# Patient Record
Sex: Male | Born: 1960 | Race: White | Hispanic: No | Marital: Single | Smoking: Former smoker
Health system: Southern US, Community
[De-identification: ages and names within clinical notes are randomized; demographics above are authoritative.]

## PROBLEM LIST (undated history)

## (undated) DIAGNOSIS — E119 Type 2 diabetes mellitus without complications: Secondary | ICD-10-CM

## (undated) DIAGNOSIS — F319 Bipolar disorder, unspecified: Secondary | ICD-10-CM

## (undated) DIAGNOSIS — E7800 Pure hypercholesterolemia, unspecified: Secondary | ICD-10-CM

## (undated) DIAGNOSIS — I10 Essential (primary) hypertension: Secondary | ICD-10-CM

## (undated) DIAGNOSIS — F1290 Cannabis use, unspecified, uncomplicated: Secondary | ICD-10-CM

## (undated) DIAGNOSIS — S3994XA Unspecified injury of external genitals, initial encounter: Secondary | ICD-10-CM

## (undated) DIAGNOSIS — Z720 Tobacco use: Secondary | ICD-10-CM

## (undated) HISTORY — PX: APPENDECTOMY: SHX54

---

## 2006-12-22 ENCOUNTER — Emergency Department (HOSPITAL_COMMUNITY): Admission: EM | Admit: 2006-12-22 | Discharge: 2006-12-22 | Payer: Self-pay | Admitting: Emergency Medicine

## 2007-10-15 ENCOUNTER — Ambulatory Visit: Payer: Self-pay | Admitting: Cardiology

## 2008-12-28 ENCOUNTER — Inpatient Hospital Stay (HOSPITAL_COMMUNITY): Admission: EM | Admit: 2008-12-28 | Discharge: 2008-12-30 | Payer: Self-pay | Admitting: Emergency Medicine

## 2010-10-30 LAB — CBC
HCT: 36 % — ABNORMAL LOW (ref 39.0–52.0)
HCT: 41.8 % (ref 39.0–52.0)
Hemoglobin: 12.7 g/dL — ABNORMAL LOW (ref 13.0–17.0)
Hemoglobin: 12.9 g/dL — ABNORMAL LOW (ref 13.0–17.0)
Hemoglobin: 14.8 g/dL (ref 13.0–17.0)
MCHC: 35.6 g/dL (ref 30.0–36.0)
MCV: 86.9 fL (ref 78.0–100.0)
MCV: 88.7 fL (ref 78.0–100.0)
Platelets: 235 10*3/uL (ref 150–400)
RBC: 4.14 MIL/uL — ABNORMAL LOW (ref 4.22–5.81)
RDW: 12.3 % (ref 11.5–15.5)
WBC: 11.5 10*3/uL — ABNORMAL HIGH (ref 4.0–10.5)
WBC: 7.3 10*3/uL (ref 4.0–10.5)

## 2010-10-30 LAB — DIFFERENTIAL
Basophils Absolute: 0 10*3/uL (ref 0.0–0.1)
Basophils Absolute: 0.1 10*3/uL (ref 0.0–0.1)
Basophils Relative: 0 % (ref 0–1)
Basophils Relative: 1 % (ref 0–1)
Eosinophils Absolute: 0.1 10*3/uL (ref 0.0–0.7)
Eosinophils Relative: 1 % (ref 0–5)
Lymphocytes Relative: 27 % (ref 12–46)
Lymphocytes Relative: 30 % (ref 12–46)
Lymphs Abs: 2.9 10*3/uL (ref 0.7–4.0)
Monocytes Absolute: 0.6 10*3/uL (ref 0.1–1.0)
Monocytes Absolute: 0.7 10*3/uL (ref 0.1–1.0)
Monocytes Relative: 6 % (ref 3–12)
Neutro Abs: 4.2 10*3/uL (ref 1.7–7.7)
Neutro Abs: 4.2 10*3/uL (ref 1.7–7.7)
Neutrophils Relative %: 62 % (ref 43–77)

## 2010-10-30 LAB — COMPREHENSIVE METABOLIC PANEL
Alkaline Phosphatase: 53 U/L (ref 39–117)
BUN: 9 mg/dL (ref 6–23)
CO2: 31 mEq/L (ref 19–32)
Chloride: 100 mEq/L (ref 96–112)
Creatinine, Ser: 0.64 mg/dL (ref 0.4–1.5)
GFR calc non Af Amer: >60 mL/min (ref >60)
Glucose, Bld: 110 mg/dL — ABNORMAL HIGH (ref 70–99)
Potassium: 4 mEq/L (ref 3.5–5.1)
Total Bilirubin: 0.6 mg/dL (ref 0.3–1.2)

## 2010-10-30 LAB — VANCOMYCIN, TROUGH: Vancomycin Tr: 9.1 ug/mL — ABNORMAL LOW (ref 10.0–20.0)

## 2010-10-30 LAB — FIBRINOGEN: Fibrinogen: 350 mg/dL (ref 204–475)

## 2010-10-30 LAB — BASIC METABOLIC PANEL
BUN: 8 mg/dL (ref 6–23)
CO2: 29 mEq/L (ref 19–32)
Calcium: 8.8 mg/dL (ref 8.4–10.5)
Chloride: 98 mEq/L (ref 96–112)
Creatinine, Ser: 0.67 mg/dL (ref 0.4–1.5)
GFR calc Af Amer: >60 mL/min (ref >60)
GFR calc non Af Amer: >60 mL/min (ref >60)
Glucose, Bld: 131 mg/dL — ABNORMAL HIGH (ref 70–99)
Potassium: 3.7 mEq/L (ref 3.5–5.1)
Sodium: 137 mEq/L (ref 135–145)

## 2010-10-30 LAB — PROTIME-INR: Prothrombin Time: 13.3 seconds (ref 11.6–15.2)

## 2010-12-05 NOTE — H&P (Signed)
[name], Omar Wood                 ACCOUNT NO.:  [id number]   MEDICAL RECORD NO.:  [id number]          PATIENT TYPE:  INP   LOCATION:  A214                          FACILITY:  [facility]   PHYSICIAN:  Margaretmary Dys, M.D.DATE OF BIRTH:  02/11/1961   DATE OF ADMISSION:  12/28/2008  DATE OF DISCHARGE:  LH                              HISTORY & PHYSICAL   ADMISSION DIAGNOSES:  1. Probable snake bite right arm.  2. Swelling with redness and induration and tenderness entire right      arm and forearm and hand.  3. History of anxiety disorder.   CHIEF COMPLAINT:  Probable state by last night.   HISTORY OF PRESENT ILLNESS:  Mr. Agne is a 50 year old male who  presented to our emergency room today complaining of severe swelling and  pain in his right arm.  The patient said he was working and he may have  put his hand in a hole and felt something bite him.  This was around 9  p.m.  The patient did not see what bit him.  The patient subsequently  went to his nearest hospital where he received some pain medication and  was sent home.   I think the patient went to [facility].  He has had increasing pain and  swelling in that right arm.  He denies any numbness.  He does report  that the pain is aggravated by moving his arm.   He has had some redness too.  Initially when the bite occurred, the  swelling stopped at his elbow, but this has extended up to his upper  arm.   The patient is currently reporting his pain to be about 5-6/10 with  Dilaudid helping.  The patient denies any fevers or chills.  No  headaches or dizziness.  No other skin eruptions or erythema was  observed other than his right arm.  He denies any other behavioral  changes.  He denies any hematuria or dysuria.  The patient did have some  nausea and vomiting earlier today.  He said it was probably from the  pain medication from the Dilaudid.  The patient arrived here in the  emergency room and is now being admitted for  further management.   REVIEW OF SYSTEMS:  As mentioned history of present illness.   PAST MEDICAL HISTORY:  1. Hypertension.  2. History of anxiety disorder.Marland Kitchen   PAST SURGICAL HISTORY:  Status post appendectomy.   SOCIAL HISTORY:  The patient is single, has no children and he is smoker  of one pack of cigarettes a day.  He drinks fairly heavily and also has  history of narcotic abuse.   FAMILY HISTORY:  The patient denies any history of diabetes or  hypertension.  The patient is currently unemployed.   MEDICATIONS:  1. Restoril at night.  2. Xanax tablets up to four times a day.  The patient does not give      any doses at this time.   ALLERGIES:  NO KNOWN DRUG ALLERGIES.   PHYSICAL EXAM:  GENERAL:  The patient was conscious, alert, comfortable,  not in acute distress, was well oriented in time, place and person.  The  patient appears somewhat anxious.  White blood cell count was 11.5,  hemoglobin of 14.8, hematocrit 41.8, platelet count was 235 with no left  shift.  PT was 12.8, INR 1.0, PTT of 28.  Fibrinogen was 350.  Sodium is  131, potassium is 3.7, chloride of 98, CO2 is 23, glucose 139, BUN of 8,  creatinine was 0.59, calcium is 8.6.   ASSESSMENT:  A 50 year old male presenting to the emergency room with  swelling of his right arm and hand and upper arm.   The patient appears to have puncture wound in the middle finger of his  right hand.  The patient has received antivenom in the emergency room..  The patient's pain is controlled currently with Dilaudid.   PLAN:  1. The patient will be admitted to the medical floor for close      observation.  2. The patient coagulation profile is normal at this time.  3. Will keep his right arm elevated to relieve the pain and discomfort      associated with swelling.  4. Will continue to monitor his neurovascular bundle.  The patient is      able to move all his arms and has good capillary refill.  5. The patient has received  an antivenin in the emergency room.  6. The patient also received DPT toxoid immunization in the emergency      room.  The patient is currently empirically on vancomycin and Zosyn      which we will request pharmacy to dose.  7. The patient be placed on SCDs for DVT prophylaxis.  8. The patient received some Ativan p.r.n.  We will monitor      coagulation profile again in the morning and liver function tests      which remains stable.  9. The patient will be maintained on IV fluids normal saline with some      potassium replacement.   I have explained the above plan to the patient and also mentioned  potential concern for worsening swelling that might require emergency  surgery such as a fasciotomy.  The patient verbalized full understanding  and requested Dr. Leticia Penna to evaluate the patient as a non-urgent  consult.      Margaretmary Dys, M.D.  Electronically Signed     AM/MEDQ  D:  12/28/2008  T:  12/28/2008  Job:  [id number]

## 2010-12-05 NOTE — Discharge Summary (Signed)
NAME:  Omar Wood, Omar Wood                 ACCOUNT NO.:  [id number]   MEDICAL RECORD NO.:  [id number]          PATIENT TYPE:  INP   LOCATION:  A328                          FACILITY:  [facility]   PHYSICIAN:  Osvaldo Shipper, MD     DATE OF BIRTH:  [birth year]   DATE OF ADMISSION:  12/28/2008  DATE OF DISCHARGE:  06/10/2010LH                               DISCHARGE SUMMARY   The patient does not have a PMD.   DISCHARGE DIAGNOSES:  1. Snake bite, unknown type, improved.  2. Anxiety disorder.   Please review H and P dictated at the time of admission for details  regarding the patient's presenting illness.   BRIEF HOSPITAL COURSE:  Briefly, this is a 50 year old Caucasian male  who was in his usual state of health until the date before admission  when he was outside his home, moving some logs when he suddenly felt a  bite on his right hand.  He saw bite marks.  He went to [facility] where he received some pain medication and was sent home.  The  patient subsequently started developing swelling in that right arm and  so, he decided to come in to [facility].  He was thought to  have a snake bite since he had 2 puncture wounds.  He was given  antivenom.  The patient was subsequently admitted for further evaluation  and monitoring.  His right arm did show significant swelling, but pulses  were present.  He did not experience any tingling or numbness.  Neurovascular bundle seemed to be patent and viable.  He was also seen  by general surgeon, Dr. Leticia Penna, who did not feel like there was any  indication for surgical intervention.  The patient was put on vancomycin  and Zosyn initially.  His swelling has significantly improved.  His pain  has been somewhat better.  His blood work is all unremarkable.  So at  this time, the patient has shown significant improvement and can be  discharged home just with p.o. doxycycline.  He denies any complaints  this morning.  He is anxious to get out of  here.  His vital signs are  all stable.  He is afebrile.  Lungs are clear.  His right upper  extremity showing significantly improved swelling.  Pulses are present.  Bite mark obviously still present.  No significant erythema is noted.   DISCHARGE MEDICATIONS:  1. Doxycycline 100 mg twice daily for 7 days.  2. Percocet 5/325 every 6 hours as needed for pain, 20 tablets      prescribed.  3. Ativan 1 mg every 6 hours as needed for anxiety, 10 tablets      prescribed.   Follow up with Mental Health for anxiety.  He may call Dr. Leticia Penna for  followup if the swelling does not seem to be improving.   DIET:  Regular.   PHYSICAL ACTIVITY:  No restrictions.  He may return to work next Monday,  that is January 03, 2009, but currently he is unemployed.   He is requesting financial assistance with medications.  [employer] does  not have doxycycline on its 4-dollar list; however, he needs this  antibiotic.   No imaging studies were done during this admission.  Consultations from  Dr. Leticia Penna as discussed above.   Total time on this encounter 35 minutes.      Osvaldo Shipper, MD  Electronically Signed     GK/MEDQ  D:  12/30/2008  T:  12/31/2008  Job:  [id number]   cc:   Tilford Pillar, MD  Fax: [phone]
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
## 2011-05-10 LAB — COMPREHENSIVE METABOLIC PANEL
ALT: 28
BUN: 7
CO2: 31
Calcium: 9.3
Creatinine, Ser: 0.77
GFR calc non Af Amer: >60
Glucose, Bld: 160 — ABNORMAL HIGH
Sodium: 138
Total Protein: 7.1

## 2011-05-10 LAB — RAPID URINE DRUG SCREEN, HOSP PERFORMED
Barbiturates: NOT DETECTED
Cocaine: NOT DETECTED
Opiates: NOT DETECTED

## 2011-05-10 LAB — CBC
HCT: 47.5
Hemoglobin: 16.2
MCHC: 34.1
MCV: 93.1
RBC: 5.1
RDW: 14.8 — ABNORMAL HIGH

## 2011-05-10 LAB — DIFFERENTIAL
Basophils Absolute: 0
Basophils Relative: 0
Eosinophils Relative: 1
Lymphs Abs: 1.2
Monocytes Absolute: 0.6
Neutro Abs: 6.7
Neutrophils Relative %: 77

## 2014-02-15 ENCOUNTER — Emergency Department (HOSPITAL_COMMUNITY)
Admission: EM | Admit: 2014-02-15 | Discharge: 2014-02-15 | Disposition: A | Discharge status: Home / Self Care | Payer: Self-pay | Attending: Emergency Medicine | Admitting: Emergency Medicine

## 2014-02-15 ENCOUNTER — Encounter (HOSPITAL_COMMUNITY): Payer: Self-pay | Admitting: Emergency Medicine

## 2014-02-15 ENCOUNTER — Emergency Department (HOSPITAL_COMMUNITY): Payer: Self-pay

## 2014-02-15 DIAGNOSIS — F17200 Nicotine dependence, unspecified, uncomplicated: Secondary | ICD-10-CM | POA: Insufficient documentation

## 2014-02-15 DIAGNOSIS — Z87828 Personal history of other (healed) physical injury and trauma: Secondary | ICD-10-CM | POA: Insufficient documentation

## 2014-02-15 DIAGNOSIS — R079 Chest pain, unspecified: Secondary | ICD-10-CM | POA: Insufficient documentation

## 2014-02-15 DIAGNOSIS — E119 Type 2 diabetes mellitus without complications: Secondary | ICD-10-CM | POA: Insufficient documentation

## 2014-02-15 DIAGNOSIS — I10 Essential (primary) hypertension: Secondary | ICD-10-CM | POA: Insufficient documentation

## 2014-02-15 DIAGNOSIS — Z79899 Other long term (current) drug therapy: Secondary | ICD-10-CM | POA: Insufficient documentation

## 2014-02-15 DIAGNOSIS — R109 Unspecified abdominal pain: Secondary | ICD-10-CM | POA: Insufficient documentation

## 2014-02-15 DIAGNOSIS — R1030 Lower abdominal pain, unspecified: Secondary | ICD-10-CM

## 2014-02-15 HISTORY — DX: Unspecified injury of external genitals, initial encounter: S39.94XA

## 2014-02-15 HISTORY — DX: Essential (primary) hypertension: I10

## 2014-02-15 HISTORY — DX: Type 2 diabetes mellitus without complications: E11.9

## 2014-02-15 HISTORY — DX: Pure hypercholesterolemia, unspecified: E78.00

## 2014-02-15 LAB — BASIC METABOLIC PANEL
ANION GAP: 18 — AB (ref 5–15)
BUN: 5 mg/dL — ABNORMAL LOW (ref 6–23)
CHLORIDE: 97 meq/L (ref 96–112)
CO2: 25 meq/L (ref 19–32)
Calcium: 8.9 mg/dL (ref 8.4–10.5)
Creatinine, Ser: 0.46 mg/dL — ABNORMAL LOW (ref 0.50–1.35)
GFR calc Af Amer: >90 mL/min (ref >90)
GFR calc non Af Amer: >90 mL/min (ref >90)
GLUCOSE: 292 mg/dL — AB (ref 70–99)
Potassium: 4.2 mEq/L (ref 3.7–5.3)
SODIUM: 140 meq/L (ref 137–147)

## 2014-02-15 LAB — CBC WITH DIFFERENTIAL/PLATELET
BASOS ABS: 0 10*3/uL (ref 0.0–0.1)
Basophils Relative: 0 % (ref 0–1)
Eosinophils Absolute: 0.1 10*3/uL (ref 0.0–0.7)
Eosinophils Relative: 1 % (ref 0–5)
HEMATOCRIT: 37.7 % — AB (ref 39.0–52.0)
Hemoglobin: 13.2 g/dL (ref 13.0–17.0)
LYMPHS ABS: 1.8 10*3/uL (ref 0.7–4.0)
LYMPHS PCT: 18 % (ref 12–46)
MCH: 30.8 pg (ref 26.0–34.0)
MCHC: 35 g/dL (ref 30.0–36.0)
MCV: 87.9 fL (ref 78.0–100.0)
Monocytes Absolute: 0.7 10*3/uL (ref 0.1–1.0)
Monocytes Relative: 7 % (ref 3–12)
NEUTROS ABS: 7.7 10*3/uL (ref 1.7–7.7)
Neutrophils Relative %: 74 % (ref 43–77)
PLATELETS: 263 10*3/uL (ref 150–400)
RBC: 4.29 MIL/uL (ref 4.22–5.81)
RDW: 13.5 % (ref 11.5–15.5)
WBC: 10.2 10*3/uL (ref 4.0–10.5)

## 2014-02-15 LAB — TROPONIN I

## 2014-02-15 MED ORDER — LORAZEPAM 2 MG/ML IJ SOLN
1.0000 mg | Freq: Once | INTRAMUSCULAR | Status: AC
Start: 2014-02-15 — End: 2014-02-15
  Administered 2014-02-15: 1 mg via INTRAVENOUS
  Filled 2014-02-15: qty 1

## 2014-02-15 MED ORDER — MORPHINE SULFATE 4 MG/ML IJ SOLN
6.0000 mg | Freq: Once | INTRAMUSCULAR | Status: AC
  Administered 2014-02-15: 6 mg via INTRAVENOUS
  Filled 2014-02-15: qty 2

## 2014-02-15 MED ORDER — SODIUM CHLORIDE 0.9 % IV BOLUS (SEPSIS)
1000.0000 mL | Freq: Once | INTRAVENOUS | Status: AC
  Administered 2014-02-15: 1000 mL via INTRAVENOUS

## 2014-02-15 MED ORDER — HYDROMORPHONE HCL PF 1 MG/ML IJ SOLN: 1.0000 mg | Freq: Once | INTRAMUSCULAR | Status: DC

## 2014-02-15 MED ORDER — HYDROMORPHONE HCL PF 1 MG/ML IJ SOLN
1.0000 mg | Freq: Once | INTRAMUSCULAR | Status: AC
  Administered 2014-02-15: 1 mg via INTRAVENOUS
  Filled 2014-02-15: qty 1

## 2014-02-15 MED ORDER — LORAZEPAM 1 MG PO TABS: 1.0000 mg | ORAL_TABLET | Freq: Three times a day (TID) | ORAL | Status: DC | PRN

## 2014-02-15 NOTE — ED Notes (Signed)
Pt upset that he did not receive prescription for pain and for spasms, pt received script for Ativan and explained to pt that Ativan can help with spasms, pt still upset, refused to sign for d/c instructions, pt ambulated out with mother

## 2014-02-15 NOTE — ED Notes (Signed)
MD at bedside. 

## 2014-02-15 NOTE — ED Notes (Addendum)
Pt states CP x 2 days, described as constant and pressure-like. Also states pain to scrotum and lower abdomen. States [facility] with [facility] and bladder spasms. Pt states pain medication is not working for bladder spasms. Pt is not very cooperative in triage. Pt states he has had a couple of beers today

## 2014-02-15 NOTE — Discharge Instructions (Signed)
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
## 2017-08-13 ENCOUNTER — Encounter (HOSPITAL_COMMUNITY): Payer: Self-pay | Admitting: Emergency Medicine

## 2017-08-13 ENCOUNTER — Emergency Department (HOSPITAL_COMMUNITY)
Admission: EM | Admit: 2017-08-13 | Discharge: 2017-08-13 | Disposition: A | Discharge status: Home / Self Care | Attending: Emergency Medicine | Admitting: Emergency Medicine

## 2017-08-13 ENCOUNTER — Other Ambulatory Visit: Payer: Self-pay

## 2017-08-13 ENCOUNTER — Emergency Department (HOSPITAL_COMMUNITY)

## 2017-08-13 DIAGNOSIS — Z87891 Personal history of nicotine dependence: Secondary | ICD-10-CM | POA: Insufficient documentation

## 2017-08-13 DIAGNOSIS — I10 Essential (primary) hypertension: Secondary | ICD-10-CM | POA: Insufficient documentation

## 2017-08-13 DIAGNOSIS — E1010 Type 1 diabetes mellitus with ketoacidosis without coma: Secondary | ICD-10-CM | POA: Insufficient documentation

## 2017-08-13 DIAGNOSIS — Z794 Long term (current) use of insulin: Secondary | ICD-10-CM | POA: Insufficient documentation

## 2017-08-13 DIAGNOSIS — E860 Dehydration: Secondary | ICD-10-CM | POA: Insufficient documentation

## 2017-08-13 DIAGNOSIS — R4182 Altered mental status, unspecified: Secondary | ICD-10-CM | POA: Diagnosis present

## 2017-08-13 DIAGNOSIS — R739 Hyperglycemia, unspecified: Secondary | ICD-10-CM

## 2017-08-13 LAB — CBC WITH DIFFERENTIAL/PLATELET
BASOS ABS: 0 10*3/uL (ref 0.0–0.1)
Basophils Relative: 0 %
Eosinophils Absolute: 0 10*3/uL (ref 0.0–0.7)
Eosinophils Relative: 0 %
HEMATOCRIT: 49.2 % (ref 39.0–52.0)
HEMOGLOBIN: 16.9 g/dL (ref 13.0–17.0)
Lymphocytes Relative: 24 %
Lymphs Abs: 2.9 10*3/uL (ref 0.7–4.0)
MCH: 28.4 pg (ref 26.0–34.0)
MCHC: 34.3 g/dL (ref 30.0–36.0)
MCV: 82.7 fL (ref 78.0–100.0)
MONO ABS: 0.7 10*3/uL (ref 0.1–1.0)
Monocytes Relative: 6 %
NEUTROS ABS: 8.3 10*3/uL — AB (ref 1.7–7.7)
NEUTROS PCT: 70 %
Platelets: 248 10*3/uL (ref 150–400)
RBC: 5.95 MIL/uL — AB (ref 4.22–5.81)
RDW: 12 % (ref 11.5–15.5)
WBC: 12 10*3/uL — AB (ref 4.0–10.5)

## 2017-08-13 LAB — BASIC METABOLIC PANEL
Anion gap: 12 (ref 5–15)
BUN: 18 mg/dL (ref 6–20)
CO2: 25 mmol/L (ref 22–32)
CREATININE: 0.73 mg/dL (ref 0.61–1.24)
Calcium: 8.3 mg/dL — ABNORMAL LOW (ref 8.9–10.3)
Chloride: 98 mmol/L — ABNORMAL LOW (ref 101–111)
GFR calc Af Amer: >60 mL/min (ref >60)
Glucose, Bld: 205 mg/dL — ABNORMAL HIGH (ref 65–99)
POTASSIUM: 3.4 mmol/L — AB (ref 3.5–5.1)
SODIUM: 135 mmol/L (ref 135–145)

## 2017-08-13 LAB — URINALYSIS, ROUTINE W REFLEX MICROSCOPIC
BACTERIA UA: NONE SEEN
BILIRUBIN URINE: NEGATIVE
Glucose, UA: >=500 mg/dL — AB
Hgb urine dipstick: NEGATIVE
Ketones, ur: 20 mg/dL — AB
Leukocytes, UA: NEGATIVE
Nitrite: NEGATIVE
Protein, ur: NEGATIVE mg/dL
RBC / HPF: NONE SEEN RBC/hpf (ref 0–5)
SQUAMOUS EPITHELIAL / LPF: NONE SEEN
Specific Gravity, Urine: 1.033 — ABNORMAL HIGH (ref 1.005–1.030)
pH: 5 (ref 5.0–8.0)

## 2017-08-13 LAB — COMPREHENSIVE METABOLIC PANEL
ALBUMIN: 4.4 g/dL (ref 3.5–5.0)
ALT: 100 U/L — ABNORMAL HIGH (ref 17–63)
ANION GAP: 17 — AB (ref 5–15)
AST: 43 U/L — ABNORMAL HIGH (ref 15–41)
Alkaline Phosphatase: 119 U/L (ref 38–126)
BILIRUBIN TOTAL: 0.8 mg/dL (ref 0.3–1.2)
BUN: 23 mg/dL — ABNORMAL HIGH (ref 6–20)
CO2: 24 mmol/L (ref 22–32)
Calcium: 9.5 mg/dL (ref 8.9–10.3)
Chloride: 96 mmol/L — ABNORMAL LOW (ref 101–111)
Creatinine, Ser: 0.75 mg/dL (ref 0.61–1.24)
GFR calc non Af Amer: >60 mL/min (ref >60)
Glucose, Bld: 360 mg/dL — ABNORMAL HIGH (ref 65–99)
POTASSIUM: 4.4 mmol/L (ref 3.5–5.1)
Sodium: 137 mmol/L (ref 135–145)
TOTAL PROTEIN: 8.1 g/dL (ref 6.5–8.1)

## 2017-08-13 LAB — CBG MONITORING, ED
GLUCOSE-CAPILLARY: 218 mg/dL — AB (ref 65–99)
GLUCOSE-CAPILLARY: 200 mg/dL — AB (ref 65–99)
Glucose-Capillary: 370 mg/dL — ABNORMAL HIGH (ref 65–99)
Glucose-Capillary: 300 mg/dL — ABNORMAL HIGH (ref 65–99)

## 2017-08-13 LAB — RAPID URINE DRUG SCREEN, HOSP PERFORMED
Amphetamines: NOT DETECTED
Barbiturates: NOT DETECTED
Benzodiazepines: POSITIVE — AB
Cocaine: NOT DETECTED
OPIATES: NOT DETECTED
Tetrahydrocannabinol: NOT DETECTED

## 2017-08-13 MED ORDER — SODIUM CHLORIDE 0.9 % IV SOLN
INTRAVENOUS | Status: DC
  Administered 2017-08-13: 2.4 [IU]/h via INTRAVENOUS
  Filled 2017-08-13 (×2): qty 1

## 2017-08-13 MED ORDER — DEXTROSE-NACL 5-0.45 % IV SOLN: INTRAVENOUS | Status: DC

## 2017-08-13 MED ORDER — SODIUM CHLORIDE 0.9 % IV BOLUS (SEPSIS)
2000.0000 mL | Freq: Once | INTRAVENOUS | Status: AC
  Administered 2017-08-13: 2000 mL via INTRAVENOUS

## 2017-08-13 NOTE — ED Notes (Signed)
Pt taken to xray 

## 2017-08-13 NOTE — ED Notes (Signed)
Pt returned from xray

## 2017-08-13 NOTE — ED Provider Notes (Signed)
[facility] EMERGENCY DEPARTMENT Provider Note   CSN: [id number] Arrival date & time: 08/13/17  1640     History   Chief Complaint Chief Complaint  Patient presents with  . Altered Mental Status    HPI Omar Wood is a 57 y.o. male.  He is here for general weakness, and hyperglycemia.  He reports that he is taking his insulin as prescribed, but he feels like his "ketones are high."  He has been incarcerated for 2 weeks, and is receiving medications by the jail nurse.  He denies nausea, vomiting, focal weakness or paresthesia.  He is reported to have "altered mental status," but is able to give history.  He has periods of noncompliance prior to incarceration, when he was treated in [place], [place].  There are no other known modifying factors.  HPI  Past Medical History:  Diagnosis Date  . Diabetes mellitus without complication ([place])   . Hypercholesteremia   . Hypertension   . Scrotal injury     There are no active problems to display for this patient.   Past Surgical History:  Procedure Laterality Date  . APPENDECTOMY         Home Medications    Prior to Admission medications   Medication Sig Start Date End Date Taking? Authorizing Provider  insulin detemir (LEVEMIR) 100 UNIT/ML injection Inject into the skin. 07/24/17  Yes [provider]    Family History History reviewed. No pertinent family history.  Social History Social History   Tobacco Use  . Smoking status: Former Smoker    Types: Cigarettes  . Smokeless tobacco: Never Used  Substance Use Topics  . Alcohol use: No    Frequency: Never  . Drug use: No     Allergies   Patient has no known allergies.   Review of Systems Review of Systems  All other systems reviewed and are negative.    Physical Exam Updated Vital Signs BP (!) 154/88   Pulse 92   Temp 97.9 F (36.6 C) (Temporal)   Resp 18   Ht _0  (1.727 m)   Wt 68 kg (150 lb)   SpO2 99%   BMI 22.81 kg/m    Physical Exam  Constitutional: He is oriented to person, place, and time. He appears well-developed and well-nourished.  HENT:  Head: Normocephalic and atraumatic.  Right Ear: External ear normal.  Left Ear: External ear normal.  Eyes: Conjunctivae and EOM are normal. Pupils are equal, round, and reactive to light.  Neck: Normal range of motion and phonation normal. Neck supple.  Cardiovascular: Normal rate, regular rhythm and normal heart sounds.  Pulmonary/Chest: Effort normal and breath sounds normal. He exhibits no bony tenderness.  Abdominal: Soft. There is no tenderness.  Musculoskeletal: Normal range of motion.  Normal gait  Neurological: He is alert and oriented to person, place, and time. No cranial nerve deficit or sensory deficit. He exhibits normal muscle tone. Coordination normal.  No dysarthria, aphasia or nystagmus.  Skin: Skin is warm, dry and intact.  Psychiatric: He has a normal mood and affect. His behavior is normal. Judgment and thought content normal.  Nursing note and vitals reviewed.    ED Treatments / Results  Labs (all labs ordered are listed, but only abnormal results are displayed) Labs Reviewed  COMPREHENSIVE METABOLIC PANEL - Abnormal; Notable for the following components:      Result Value   Chloride 96 (*)    Glucose, Bld 360 (*)    BUN 23 (*)  AST 43 (*)    ALT 100 (*)    Anion gap 17 (*)    All other components within normal limits  CBC WITH DIFFERENTIAL/PLATELET - Abnormal; Notable for the following components:   WBC 12.0 (*)    RBC 5.95 (*)    Neutro Abs 8.3 (*)    All other components within normal limits  URINALYSIS, ROUTINE W REFLEX MICROSCOPIC - Abnormal; Notable for the following components:   Specific Gravity, Urine 1.033 (*)    Glucose, UA >=500 (*)    Ketones, ur 20 (*)    All other components within normal limits  RAPID URINE DRUG SCREEN, HOSP PERFORMED - Abnormal; Notable for the following components:   Benzodiazepines  POSITIVE (*)    All other components within normal limits  BASIC METABOLIC PANEL - Abnormal; Notable for the following components:   Potassium 3.4 (*)    Chloride 98 (*)    Glucose, Bld 205 (*)    Calcium 8.3 (*)    All other components within normal limits  CBG MONITORING, ED - Abnormal; Notable for the following components:   Glucose-Capillary 370 (*)    All other components within normal limits  CBG MONITORING, ED - Abnormal; Notable for the following components:   Glucose-Capillary 300 (*)    All other components within normal limits  CBG MONITORING, ED - Abnormal; Notable for the following components:   Glucose-Capillary 218 (*)    All other components within normal limits  CBG MONITORING, ED - Abnormal; Notable for the following components:   Glucose-Capillary 200 (*)    All other components within normal limits    EKG  EKG Interpretation None       Radiology Dg Chest 2 View  Result Date: 08/13/2017 CLINICAL DATA:  Altered mental status and shortness of breath EXAM: CHEST  2 VIEW COMPARISON:  October 22, 2016 FINDINGS: Lungs are clear. Heart size and pulmonary vascularity are normal. No adenopathy. There is an old healed fracture of the left clavicle. IMPRESSION: No edema or consolidation. Electronically Signed   By: Lowella Grip III M.D.   On: 08/13/2017 17:41    Procedures .Critical Care Performed by: Daleen Bo, MD Authorized by: Daleen Bo, MD   Critical care provider statement:    Critical care time (minutes):  40   Critical care start time:  08/13/2017 5:20 PM   Critical care end time:  08/13/2017 9:31 PM   Critical care was necessary to treat or prevent imminent or life-threatening deterioration of the following conditions:  Endocrine crisis   Critical care was time spent personally by me on the following activities:  Blood draw for specimens, development of treatment plan with patient or surrogate, evaluation of patient's response to treatment,  examination of patient, ordering and performing treatments and interventions, ordering and review of laboratory studies, pulse oximetry, re-evaluation of patient's condition and review of old charts   (including critical care time)  Medications Ordered in ED Medications  dextrose 5 %-0.45 % sodium chloride infusion (not administered)  insulin regular (NOVOLIN R,HUMULIN R) 100 Units in sodium chloride 0.9 % 100 mL (1 Units/mL) infusion (2.8 Units/hr Intravenous Rate/Dose Change 08/13/17 2101)  sodium chloride 0.9 % bolus 2,000 mL (0 mLs Intravenous Stopped 08/13/17 1957)     Initial Impression / Assessment and Plan / ED Course  I have reviewed the triage vital signs and the nursing notes.  Pertinent labs & imaging results that were available during my care of the patient were reviewed  by me and considered in my medical decision making (see chart for details).  Clinical Course as of Aug 13 2132  Tue Aug 13, 2017  1720 Hyperglycemia, cause not clear, no apparent infection.  Patient will be evaluated, and treated for hyperglycemia.  [EW]  3744 Initial labs indicate mild DKA.  Patient has been treated with IV fluid bolus, and will start insulin drip.  [EW]  2130 On repeat be met testing after treatment, anion gap is closed. Anion gap: 12 [EW]    Clinical Course User Index [EW] Daleen Bo, MD     Patient Vitals for the past 24 hrs:  BP Temp Temp src Pulse Resp SpO2 Height Weight  08/13/17 1832 (!) 154/88 - - 92 18 99 % - -  08/13/17 1818 (!) 154/88 - - - - - - -  08/13/17 1742 - - - 88 - 100 % - -  08/13/17 1647 - - - - - - _0  (1.727 m) 68 kg (150 lb)  08/13/17 1645 (!) 142/102 97.9 F (36.6 C) Temporal (!) 109 (!) 24 100 % _1  (1.727 m) 68 kg (150 lb)    9:32 PM Reevaluation with update and discussion. After initial assessment and treatment, an updated evaluation reveals clinical exam is reassuring.  No vomiting ED. Daleen Bo      Final Clinical Impressions(s) / ED  Diagnoses   Final diagnoses:  None    Patient with hyperglycemia and elevation of anion gap, improved after IV fluids.  Suspect dehydration associated with hyperglycemia.  Doubt serious bacterial infection or metabolic instability at time of discharge.  Nursing Notes Reviewed/ Care Coordinated Applicable Imaging Reviewed Interpretation of Laboratory Data incorporated into ED treatment  The patient appears reasonably screened and/or stabilized for discharge and I doubt any other medical condition or other [facility] requiring further screening, evaluation, or treatment in the ED at this time prior to discharge.  Plan: Home Medications-continue current treatment; Home Treatments-increase oral fluids; return here if the recommended treatment, does not improve the symptoms; Recommended follow up-PCP checkup 1 week.   ED Discharge Orders    None       Daleen Bo, MD 08/13/17 2136

## 2017-08-13 NOTE — ED Triage Notes (Signed)
[facility] unsteady gait, altered mental status, sudden onset of incontinence. CBG at facility 477.

## 2017-08-13 NOTE — ED Notes (Signed)
AC aware needing insulin drip

## 2017-08-13 NOTE — ED Notes (Signed)
Called and left message with nursing staff at jail. Number called was [phone].

## 2017-08-13 NOTE — Discharge Instructions (Signed)
Try to drink an extra liter or 2 of water each day.  Continue to watch your carbohydrate intake and take your insulin as directed.  Follow-up with a primary care doctor for a checkup in 1 week.

## 2018-09-17 ENCOUNTER — Encounter (HOSPITAL_COMMUNITY): Payer: Self-pay | Admitting: Pulmonary Disease

## 2018-09-17 ENCOUNTER — Inpatient Hospital Stay (HOSPITAL_COMMUNITY): Payer: Medicaid Other

## 2018-09-17 ENCOUNTER — Inpatient Hospital Stay (HOSPITAL_COMMUNITY)
Admission: AD | Admit: 2018-09-17 | Discharge: 2018-10-04 | DRG: 870 | Disposition: A | Discharge status: Home / Self Care | Payer: Medicaid Other | Source: Other Acute Inpatient Hospital | Attending: Internal Medicine | Admitting: Internal Medicine

## 2018-09-17 DIAGNOSIS — R1310 Dysphagia, unspecified: Secondary | ICD-10-CM | POA: Diagnosis present

## 2018-09-17 DIAGNOSIS — G9340 Encephalopathy, unspecified: Secondary | ICD-10-CM | POA: Diagnosis not present

## 2018-09-17 DIAGNOSIS — R748 Abnormal levels of other serum enzymes: Secondary | ICD-10-CM | POA: Diagnosis present

## 2018-09-17 DIAGNOSIS — F419 Anxiety disorder, unspecified: Secondary | ICD-10-CM | POA: Diagnosis present

## 2018-09-17 DIAGNOSIS — F1210 Cannabis abuse, uncomplicated: Secondary | ICD-10-CM | POA: Diagnosis present

## 2018-09-17 DIAGNOSIS — K759 Inflammatory liver disease, unspecified: Secondary | ICD-10-CM

## 2018-09-17 DIAGNOSIS — I110 Hypertensive heart disease with heart failure: Secondary | ICD-10-CM | POA: Diagnosis present

## 2018-09-17 DIAGNOSIS — I509 Heart failure, unspecified: Secondary | ICD-10-CM | POA: Diagnosis not present

## 2018-09-17 DIAGNOSIS — J189 Pneumonia, unspecified organism: Secondary | ICD-10-CM

## 2018-09-17 DIAGNOSIS — J811 Chronic pulmonary edema: Secondary | ICD-10-CM

## 2018-09-17 DIAGNOSIS — F319 Bipolar disorder, unspecified: Secondary | ICD-10-CM | POA: Diagnosis present

## 2018-09-17 DIAGNOSIS — B379 Candidiasis, unspecified: Secondary | ICD-10-CM | POA: Diagnosis not present

## 2018-09-17 DIAGNOSIS — Z794 Long term (current) use of insulin: Secondary | ICD-10-CM

## 2018-09-17 DIAGNOSIS — Z23 Encounter for immunization: Secondary | ICD-10-CM | POA: Diagnosis not present

## 2018-09-17 DIAGNOSIS — E871 Hypo-osmolality and hyponatremia: Secondary | ICD-10-CM | POA: Diagnosis not present

## 2018-09-17 DIAGNOSIS — A419 Sepsis, unspecified organism: Secondary | ICD-10-CM | POA: Diagnosis not present

## 2018-09-17 DIAGNOSIS — E876 Hypokalemia: Secondary | ICD-10-CM | POA: Diagnosis present

## 2018-09-17 DIAGNOSIS — M6282 Rhabdomyolysis: Secondary | ICD-10-CM | POA: Diagnosis present

## 2018-09-17 DIAGNOSIS — E870 Hyperosmolality and hypernatremia: Secondary | ICD-10-CM | POA: Diagnosis present

## 2018-09-17 DIAGNOSIS — B370 Candidal stomatitis: Secondary | ICD-10-CM | POA: Diagnosis not present

## 2018-09-17 DIAGNOSIS — A401 Sepsis due to streptococcus, group B: Secondary | ICD-10-CM | POA: Diagnosis present

## 2018-09-17 DIAGNOSIS — Z0000 Encounter for general adult medical examination without abnormal findings: Secondary | ICD-10-CM

## 2018-09-17 DIAGNOSIS — K130 Diseases of lips: Secondary | ICD-10-CM | POA: Diagnosis present

## 2018-09-17 DIAGNOSIS — K2950 Unspecified chronic gastritis without bleeding: Secondary | ICD-10-CM | POA: Diagnosis not present

## 2018-09-17 DIAGNOSIS — F17210 Nicotine dependence, cigarettes, uncomplicated: Secondary | ICD-10-CM | POA: Diagnosis present

## 2018-09-17 DIAGNOSIS — K149 Disease of tongue, unspecified: Secondary | ICD-10-CM | POA: Diagnosis present

## 2018-09-17 DIAGNOSIS — R6521 Severe sepsis with septic shock: Secondary | ICD-10-CM | POA: Diagnosis present

## 2018-09-17 DIAGNOSIS — E785 Hyperlipidemia, unspecified: Secondary | ICD-10-CM | POA: Diagnosis present

## 2018-09-17 DIAGNOSIS — N170 Acute kidney failure with tubular necrosis: Secondary | ICD-10-CM | POA: Diagnosis present

## 2018-09-17 DIAGNOSIS — K449 Diaphragmatic hernia without obstruction or gangrene: Secondary | ICD-10-CM | POA: Diagnosis present

## 2018-09-17 DIAGNOSIS — Z4659 Encounter for fitting and adjustment of other gastrointestinal appliance and device: Secondary | ICD-10-CM

## 2018-09-17 DIAGNOSIS — Z781 Physical restraint status: Secondary | ICD-10-CM

## 2018-09-17 DIAGNOSIS — E872 Acidosis: Secondary | ICD-10-CM | POA: Diagnosis present

## 2018-09-17 DIAGNOSIS — Z9289 Personal history of other medical treatment: Secondary | ICD-10-CM

## 2018-09-17 DIAGNOSIS — Z9911 Dependence on respirator [ventilator] status: Secondary | ICD-10-CM

## 2018-09-17 DIAGNOSIS — J9601 Acute respiratory failure with hypoxia: Secondary | ICD-10-CM | POA: Diagnosis not present

## 2018-09-17 DIAGNOSIS — K1370 Unspecified lesions of oral mucosa: Secondary | ICD-10-CM | POA: Diagnosis not present

## 2018-09-17 DIAGNOSIS — G92 Toxic encephalopathy: Secondary | ICD-10-CM | POA: Diagnosis present

## 2018-09-17 DIAGNOSIS — Z79899 Other long term (current) drug therapy: Secondary | ICD-10-CM

## 2018-09-17 DIAGNOSIS — R0689 Other abnormalities of breathing: Secondary | ICD-10-CM

## 2018-09-17 DIAGNOSIS — R0902 Hypoxemia: Secondary | ICD-10-CM

## 2018-09-17 DIAGNOSIS — E1110 Type 2 diabetes mellitus with ketoacidosis without coma: Secondary | ICD-10-CM | POA: Diagnosis present

## 2018-09-17 DIAGNOSIS — R4189 Other symptoms and signs involving cognitive functions and awareness: Secondary | ICD-10-CM | POA: Diagnosis not present

## 2018-09-17 DIAGNOSIS — Z5320 Procedure and treatment not carried out because of patient's decision for unspecified reasons: Secondary | ICD-10-CM | POA: Diagnosis present

## 2018-09-17 DIAGNOSIS — R579 Shock, unspecified: Secondary | ICD-10-CM

## 2018-09-17 DIAGNOSIS — K2210 Ulcer of esophagus without bleeding: Secondary | ICD-10-CM | POA: Diagnosis present

## 2018-09-17 DIAGNOSIS — G9341 Metabolic encephalopathy: Secondary | ICD-10-CM | POA: Diagnosis not present

## 2018-09-17 DIAGNOSIS — J9690 Respiratory failure, unspecified, unspecified whether with hypoxia or hypercapnia: Secondary | ICD-10-CM

## 2018-09-17 DIAGNOSIS — R21 Rash and other nonspecific skin eruption: Secondary | ICD-10-CM | POA: Diagnosis present

## 2018-09-17 DIAGNOSIS — K2980 Duodenitis without bleeding: Secondary | ICD-10-CM | POA: Diagnosis present

## 2018-09-17 DIAGNOSIS — I5031 Acute diastolic (congestive) heart failure: Secondary | ICD-10-CM | POA: Diagnosis present

## 2018-09-17 DIAGNOSIS — E1111 Type 2 diabetes mellitus with ketoacidosis with coma: Secondary | ICD-10-CM

## 2018-09-17 DIAGNOSIS — R0781 Pleurodynia: Secondary | ICD-10-CM

## 2018-09-17 DIAGNOSIS — T81509A Unspecified complication of foreign body accidentally left in body following unspecified procedure, initial encounter: Secondary | ICD-10-CM

## 2018-09-17 DIAGNOSIS — D696 Thrombocytopenia, unspecified: Secondary | ICD-10-CM | POA: Diagnosis present

## 2018-09-17 DIAGNOSIS — R001 Bradycardia, unspecified: Secondary | ICD-10-CM | POA: Diagnosis not present

## 2018-09-17 DIAGNOSIS — R945 Abnormal results of liver function studies: Secondary | ICD-10-CM | POA: Diagnosis present

## 2018-09-17 DIAGNOSIS — J150 Pneumonia due to Klebsiella pneumoniae: Secondary | ICD-10-CM | POA: Diagnosis present

## 2018-09-17 DIAGNOSIS — Z87891 Personal history of nicotine dependence: Secondary | ICD-10-CM | POA: Diagnosis not present

## 2018-09-17 HISTORY — DX: Bipolar disorder, unspecified: F31.9

## 2018-09-17 HISTORY — DX: Tobacco use: Z72.0

## 2018-09-17 HISTORY — DX: Cannabis use, unspecified, uncomplicated: F12.90

## 2018-09-17 LAB — BASIC METABOLIC PANEL
Anion gap: 24 — ABNORMAL HIGH (ref 5–15)
Anion gap: 18 — ABNORMAL HIGH (ref 5–15)
BUN: 62 mg/dL — ABNORMAL HIGH (ref 6–20)
BUN: 65 mg/dL — ABNORMAL HIGH (ref 6–20)
CO2: 9 mmol/L — ABNORMAL LOW (ref 22–32)
CO2: 12 mmol/L — ABNORMAL LOW (ref 22–32)
Calcium: 6.2 mg/dL — CL (ref 8.9–10.3)
Calcium: 6.1 mg/dL — CL (ref 8.9–10.3)
Chloride: 96 mmol/L — ABNORMAL LOW (ref 98–111)
Chloride: 101 mmol/L (ref 98–111)
Creatinine, Ser: 3.96 mg/dL — ABNORMAL HIGH (ref 0.61–1.24)
Creatinine, Ser: 3.77 mg/dL — ABNORMAL HIGH (ref 0.61–1.24)
GFR calc non Af Amer: 17 mL/min — ABNORMAL LOW (ref >60)
GFR, EST AFRICAN AMERICAN: 18 mL/min — AB (ref >60)
GFR, EST AFRICAN AMERICAN: 19 mL/min — AB (ref >60)
GFR, EST NON AFRICAN AMERICAN: 16 mL/min — AB (ref >60)
Glucose, Bld: 537 mg/dL (ref 70–99)
Glucose, Bld: 409 mg/dL — ABNORMAL HIGH (ref 70–99)
Potassium: 3.4 mmol/L — ABNORMAL LOW (ref 3.5–5.1)
Potassium: 2.8 mmol/L — ABNORMAL LOW (ref 3.5–5.1)
Sodium: 129 mmol/L — ABNORMAL LOW (ref 135–145)
Sodium: 131 mmol/L — ABNORMAL LOW (ref 135–145)

## 2018-09-17 LAB — BLOOD GAS, ARTERIAL
Acid-base deficit: 10 mmol/L — ABNORMAL HIGH (ref 0.0–2.0)
Bicarbonate: 14.5 mmol/L — ABNORMAL LOW (ref 20.0–28.0)
Drawn by: 55032
FIO2: 50
MECHVT: 610 mL
O2 Saturation: 97.1 %
PEEP/CPAP: 5 cmH2O
Patient temperature: 99.4
RATE: 16 resp/min
pCO2 arterial: 28 mmHg — ABNORMAL LOW (ref 32.0–48.0)
pH, Arterial: 7.338 — ABNORMAL LOW (ref 7.350–7.450)
pO2, Arterial: 80.6 mmHg — ABNORMAL LOW (ref 83.0–108.0)

## 2018-09-17 LAB — CK: CK TOTAL: 2482 U/L — AB (ref 49–397)

## 2018-09-17 LAB — GLUCOSE, CAPILLARY
GLUCOSE-CAPILLARY: 423 mg/dL — AB (ref 70–99)
Glucose-Capillary: 265 mg/dL — ABNORMAL HIGH (ref 70–99)
Glucose-Capillary: 312 mg/dL — ABNORMAL HIGH (ref 70–99)
Glucose-Capillary: 395 mg/dL — ABNORMAL HIGH (ref 70–99)
Glucose-Capillary: 414 mg/dL — ABNORMAL HIGH (ref 70–99)
Glucose-Capillary: 468 mg/dL — ABNORMAL HIGH (ref 70–99)
Glucose-Capillary: 521 mg/dL (ref 70–99)
Glucose-Capillary: 548 mg/dL (ref 70–99)
Glucose-Capillary: 580 mg/dL (ref 70–99)

## 2018-09-17 LAB — LACTIC ACID, PLASMA: Lactic Acid, Venous: 1.7 mmol/L (ref 0.5–1.9)

## 2018-09-17 LAB — CBC
HCT: 36.1 % — ABNORMAL LOW (ref 39.0–52.0)
Hemoglobin: 12.8 g/dL — ABNORMAL LOW (ref 13.0–17.0)
MCH: 29 pg (ref 26.0–34.0)
MCHC: 35.5 g/dL (ref 30.0–36.0)
MCV: 81.7 fL (ref 80.0–100.0)
NRBC: 0 % (ref 0.0–0.2)
Platelets: 270 10*3/uL (ref 150–400)
RBC: 4.42 MIL/uL (ref 4.22–5.81)
RDW: 12.2 % (ref 11.5–15.5)
WBC: 11.7 10*3/uL — ABNORMAL HIGH (ref 4.0–10.5)

## 2018-09-17 LAB — PROCALCITONIN: Procalcitonin: 32.99 ng/mL

## 2018-09-17 LAB — MRSA PCR SCREENING: MRSA BY PCR: NEGATIVE

## 2018-09-17 MED ORDER — SODIUM CHLORIDE 0.9 % IV SOLN
INTRAVENOUS | Status: DC
  Administered 2018-09-17 – 2018-09-18 (×2): via INTRAVENOUS

## 2018-09-17 MED ORDER — MIDAZOLAM HCL 2 MG/2ML IJ SOLN
2.0000 mg | INTRAMUSCULAR | Status: DC | PRN
  Filled 2018-09-17: qty 2

## 2018-09-17 MED ORDER — STERILE WATER FOR INJECTION IV SOLN
INTRAVENOUS | Status: DC
  Administered 2018-09-17: 19:00:00 via INTRAVENOUS
  Filled 2018-09-17 (×3): qty 850

## 2018-09-17 MED ORDER — PHENYLEPHRINE HCL-NACL 10-0.9 MG/250ML-% IV SOLN: 0.0000 ug/min | INTRAVENOUS | Status: DC

## 2018-09-17 MED ORDER — FENTANYL 2500MCG IN NS 250ML (10MCG/ML) PREMIX INFUSION
0.0000 ug/h | INTRAVENOUS | Status: DC
  Administered 2018-09-17: 25 ug/h via INTRAVENOUS
  Administered 2018-09-22: 250 ug/h via INTRAVENOUS
  Administered 2018-09-22: 100 ug/h via INTRAVENOUS
  Administered 2018-09-23: 300 ug/h via INTRAVENOUS
  Filled 2018-09-17 (×4): qty 250

## 2018-09-17 MED ORDER — ALBUTEROL SULFATE (2.5 MG/3ML) 0.083% IN NEBU: 2.5000 mg | INHALATION_SOLUTION | RESPIRATORY_TRACT | Status: DC | PRN

## 2018-09-17 MED ORDER — HEPARIN SODIUM (PORCINE) 5000 UNIT/ML IJ SOLN
5000.0000 [IU] | Freq: Three times a day (TID) | INTRAMUSCULAR | Status: DC
  Administered 2018-09-17 – 2018-10-03 (×39): 5000 [IU] via SUBCUTANEOUS
  Filled 2018-09-17 (×41): qty 1

## 2018-09-17 MED ORDER — FENTANYL BOLUS VIA INFUSION
10.0000 ug | INTRAVENOUS | Status: DC | PRN
  Administered 2018-09-22: 10 ug via INTRAVENOUS
  Filled 2018-09-17: qty 10

## 2018-09-17 MED ORDER — CALCIUM GLUCONATE-NACL 2-0.675 GM/100ML-% IV SOLN
2.0000 g | Freq: Once | INTRAVENOUS | Status: AC
  Administered 2018-09-17: 2000 mg via INTRAVENOUS
  Filled 2018-09-17: qty 100

## 2018-09-17 MED ORDER — ORAL CARE MOUTH RINSE
15.0000 mL | OROMUCOSAL | Status: DC
  Administered 2018-09-17 – 2018-09-24 (×69): 15 mL via OROMUCOSAL

## 2018-09-17 MED ORDER — VASOPRESSIN 20 UNIT/ML IV SOLN
0.0300 [IU]/min | INTRAVENOUS | Status: DC
  Administered 2018-09-17 – 2018-09-21 (×5): 0.03 [IU]/min via INTRAVENOUS
  Filled 2018-09-17 (×6): qty 2

## 2018-09-17 MED ORDER — CHLORHEXIDINE GLUCONATE 0.12% ORAL RINSE (MEDLINE KIT)
15.0000 mL | Freq: Two times a day (BID) | OROMUCOSAL | Status: DC
  Administered 2018-09-17 – 2018-09-24 (×14): 15 mL via OROMUCOSAL

## 2018-09-17 MED ORDER — SODIUM CHLORIDE 0.9% FLUSH
10.0000 mL | Freq: Two times a day (BID) | INTRAVENOUS | Status: DC
  Administered 2018-09-17 – 2018-09-19 (×4): 10 mL
  Administered 2018-09-19: 30 mL
  Administered 2018-09-20: 10 mL
  Administered 2018-09-20: 30 mL
  Administered 2018-09-21: 10 mL
  Administered 2018-09-21: 30 mL
  Administered 2018-09-22 – 2018-09-23 (×2): 10 mL
  Administered 2018-09-24: 20 mL
  Administered 2018-09-25 (×2): 10 mL

## 2018-09-17 MED ORDER — FENTANYL CITRATE (PF) 100 MCG/2ML IJ SOLN
100.0000 ug | INTRAMUSCULAR | Status: AC | PRN
  Administered 2018-09-17 – 2018-09-22 (×3): 100 ug via INTRAVENOUS
  Filled 2018-09-17 (×3): qty 2

## 2018-09-17 MED ORDER — PANTOPRAZOLE SODIUM 40 MG IV SOLR
40.0000 mg | Freq: Every day | INTRAVENOUS | Status: DC
  Administered 2018-09-18 – 2018-09-21 (×4): 40 mg via INTRAVENOUS
  Filled 2018-09-17 (×5): qty 40

## 2018-09-17 MED ORDER — DEXTROSE-NACL 5-0.45 % IV SOLN
INTRAVENOUS | Status: DC
  Administered 2018-09-18: 03:00:00 via INTRAVENOUS

## 2018-09-17 MED ORDER — SODIUM CHLORIDE 0.9 % IV SOLN
1.2500 ng/kg/min | INTRAVENOUS | Status: DC
  Filled 2018-09-17: qty 1

## 2018-09-17 MED ORDER — SODIUM CHLORIDE 0.9 % IV SOLN
2.0000 g | INTRAVENOUS | Status: DC
  Administered 2018-09-17 – 2018-09-18 (×2): 2 g via INTRAVENOUS
  Filled 2018-09-17 (×3): qty 20

## 2018-09-17 MED ORDER — CHLORHEXIDINE GLUCONATE CLOTH 2 % EX PADS
6.0000 | MEDICATED_PAD | Freq: Every day | CUTANEOUS | Status: DC
  Administered 2018-09-17 – 2018-09-25 (×9): 6 via TOPICAL

## 2018-09-17 MED ORDER — PHENYLEPHRINE HCL-NACL 40-0.9 MG/250ML-% IV SOLN
0.0000 ug/min | INTRAVENOUS | Status: DC
  Filled 2018-09-17: qty 250

## 2018-09-17 MED ORDER — NOREPINEPHRINE 16 MG/250ML-% IV SOLN
0.0000 ug/min | INTRAVENOUS | Status: DC
  Administered 2018-09-17: 34 ug/min via INTRAVENOUS
  Administered 2018-09-18: 15 ug/min via INTRAVENOUS
  Administered 2018-09-19: 9 ug/min via INTRAVENOUS
  Administered 2018-09-20: 3 ug/min via INTRAVENOUS
  Filled 2018-09-17 (×4): qty 250

## 2018-09-17 MED ORDER — SODIUM CHLORIDE 0.9 % IV SOLN
500.0000 mg | INTRAVENOUS | Status: DC
  Administered 2018-09-17 – 2018-09-18 (×2): 500 mg via INTRAVENOUS
  Filled 2018-09-17 (×3): qty 500

## 2018-09-17 MED ORDER — MIDAZOLAM HCL 2 MG/2ML IJ SOLN: 2.0000 mg | Freq: Once | INTRAMUSCULAR | Status: DC

## 2018-09-17 MED ORDER — SODIUM CHLORIDE 0.9% FLUSH: 10.0000 mL | INTRAVENOUS | Status: DC | PRN

## 2018-09-17 MED ORDER — FENTANYL CITRATE (PF) 100 MCG/2ML IJ SOLN
100.0000 ug | INTRAMUSCULAR | Status: DC | PRN
  Administered 2018-09-17 – 2018-09-23 (×16): 100 ug via INTRAVENOUS
  Filled 2018-09-17 (×13): qty 2

## 2018-09-17 MED ORDER — INSULIN REGULAR(HUMAN) IN NACL 100-0.9 UT/100ML-% IV SOLN
INTRAVENOUS | Status: DC
  Administered 2018-09-17: 9.8 [IU]/h via INTRAVENOUS
  Administered 2018-09-18: 12.3 [IU]/h via INTRAVENOUS
  Filled 2018-09-17 (×2): qty 100

## 2018-09-17 MED ORDER — NOREPINEPHRINE 4 MG/250ML-% IV SOLN
0.0000 ug/min | INTRAVENOUS | Status: DC
  Administered 2018-09-17 (×3): 40 ug/min via INTRAVENOUS
  Filled 2018-09-17 (×4): qty 250

## 2018-09-17 MED ORDER — POTASSIUM CHLORIDE 10 MEQ/50ML IV SOLN
10.0000 meq | INTRAVENOUS | Status: AC
  Administered 2018-09-17 – 2018-09-18 (×6): 10 meq via INTRAVENOUS
  Filled 2018-09-17 (×5): qty 50

## 2018-09-17 NOTE — Progress Notes (Signed)
Per abd xray, concerns for foreign body in abd. Relayed to Dr. Molli Knock. Concerns for object outside body showing up in scan. Verbal orders for repeat xray.

## 2018-09-17 NOTE — Progress Notes (Signed)
Received call from lab, pts Ca:6.2. Relayed to Dr. Molli Knock

## 2018-09-17 NOTE — Procedures (Signed)
Central Venous Catheter Insertion Procedure Note Omar Wood [id number] June 06, 1961   Procedure: Insertion of Central Venous Catheter Indications: Assessment of intravascular volume and Drug and/or fluid administration  Procedure Details Consent: Unable to obtain consent because of emergent medical necessity.  Time Out: Verified patient identification, verified procedure, site/side was marked, verified correct patient position, special equipment/implants available, medications/allergies/relevent history reviewed, required imaging and test results available.  Performed  Maximum sterile technique was used including antiseptics, cap, gloves, gown, hand hygiene, mask and sheet. Skin prep: Chlorhexidine; local anesthetic administered A antimicrobial bonded/coated triple lumen catheter was placed in the left internal jugular vein using the Seldinger technique.  Evaluation Blood flow good Complications: No apparent complications Patient did tolerate procedure well. Chest X-ray ordered to verify placement.  CXR: pending.  Omar Wood 09/17/2018, 4:04 PM

## 2018-09-17 NOTE — Progress Notes (Signed)
Critical ABG results called to Dr. Tonia Brooms. No new orders given at this time. RN notified of results as well.  PH 7.10 PcO2 23.3 PO2 70 HCO3 7.5

## 2018-09-17 NOTE — Procedures (Signed)
Arterial Catheter Insertion Procedure Note GUNNARR BELT [id number] 1961-03-21  Procedure: Insertion of Arterial Catheter  Indications: Blood pressure monitoring and Frequent blood sampling  Procedure Details Consent: Risks of procedure as well as the alternatives and risks of each were explained to the (patient/caregiver).  Consent for procedure obtained. Time Out: Verified patient identification, verified procedure, site/side was marked, verified correct patient position, special equipment/implants available, medications/allergies/relevent history reviewed, required imaging and test results available.  Performed  Maximum sterile technique was used including antiseptics, cap, gloves, gown, hand hygiene, mask and sheet. Skin prep: Chlorhexidine; local anesthetic administered 20 gauge catheter was inserted into left radial artery using the Seldinger technique. ULTRASOUND GUIDANCE USED: NO Evaluation Blood flow good; BP tracing good. Complications: No apparent complications.   Nadalyn Deringer R Mckaylie Vasey 09/17/2018

## 2018-09-17 NOTE — Progress Notes (Signed)
PCCM Interval Note  Concern for RUQ foreign body.  Notified by nursing staff that a needle was found lying under patient/ in sheets.  Cancel CT abd/ pelvis.    [name], MSN, AGACNP-BC [facility] Pgr: [phone] or if no answer [phone] 09/17/2018, 7:26 PM

## 2018-09-17 NOTE — Progress Notes (Addendum)
eLink Physician-Brief Progress Note Patient Name: NOX DIGENOVA DOB: August 26, 1960 MRN: [id number]   Date of Service  09/17/2018  HPI/Events of Note  A 58 year old male admitted to the ICU with diabetic ketoacidosis and is already on Levophed and vasopressin.  I received a phone call from the nurse that the patient is hypotensive with blood pressure of 87/42 map 56.  eICU Interventions  I recommended to validate the blood pressure by manual check.  I called nurse practitioner Nehemiah Settle regarding the placing the arterial line for closer hemodynamic monitoring.  Added phenylephrine to maintain map more than 65 or systolic more than 90, either.  Broaden antibiotics if blood culture is positive or patient deteriorates.  Added Giapreza.         Jae Dire Dazani Norby 09/17/2018, 8:22 PM   1:38 AM Potassium 3.1, magnesium 1.7 and calcium 7. Already receiving IV potassium replacement. Ordered IV magnesium and calcium replacements.   4:28 AM Potassium 3.4, bicarbonate 17 and anion gap 14. Ordered potassium replacement. Discontinued bicarbonate drip and decrease the D5 half-normal saline to 75 ml per hour. Once anion gap is 12 or less, can change insulin drip to subcu insulin.

## 2018-09-17 NOTE — Progress Notes (Signed)
VAST RN consulted to remove IO. Upon arrival to pt's bedside, sterile CL placement in progress. VAST RN to return later in shift to pull IO.

## 2018-09-17 NOTE — Progress Notes (Signed)
CRITICAL VALUE ALERT  Critical Value:  Calcium 6.1  Date & Time Notified:  09/17/2018 @ 2200 PM  Provider Notified: Pola Corn, MD  Orders Received/Actions taken: See [facility] for new orders. Will continue to monitor closely. Caswell Corwin, RN 09/17/18 10:20 PM

## 2018-09-17 NOTE — H&P (Addendum)
NAME:  Omar Wood, MRN:  [id number], DOB:  1961/01/23, LOS: 0 ADMISSION DATE:  09/17/2018, CONSULTATION DATE:  09/17/18 REFERRING MD:  Benson Norway, CHIEF COMPLAINT: AMS  Brief History   58 y/o M transferred from [facility] with AMS, DKA, hypotension requiring   History of present illness   58 year old male who presented to [facility] rocking him ER on 2/26 after being found unresponsive.  Downtime unknown.  Per documentation he arrived to the ER unresponsive with assisted ventilation by bag valve mask.  He was intubated upon arrival to the emergency room.  Initial work-up included a UDS which was negative, troponin 0.02, lipase 1371, glucose 942, BUN 71, creatinine 3.88, total protein 6.2, albumin 3.19, AST 94.6, ALT 44, alk phos 202, sodium 114, potassium 5.5, chloride 67, CO2 2.8, blood alcohol < 10, WBC 26.8, hemoglobin 15.2, hematocrit 43.5, MCV 84.45, platelets 356.  Urinalysis specific gravity 1.025, glucose > 1000,  ketones 40, WBC 0-5, PT 12.2, INR 1.2, aPTT 56.4, lactic 1.7.    He was hypotensive on vasopressors.  Femoral access was attempted and there was difficulty with flushing the distal port on [facility] arrival.  While pushing bicarbonate, RN noted pallor near femoral site.  Attempts at flushing the line aborted.  MD at OSH reassessed line and decided to pull central line.  Per report, on removal of central line, the guidewire was still in the central line.  Line was removed, pressure held.  Patient was transferred to [facility] for further care.   Past Medical History  Current smoker Heavy marijuana use and benzodiazepines Anxiety disorder Bipolar disorder Diabetes type 2 Hypertension Appendectomy  Significant Hospital Events   2/26  Admit to [facility] from [facility], DKA, hypotensive   Consults:    Procedures:  L IJ 2/26 >>   Significant Diagnostic Tests:  R Femoral Arterial Doppler 2/26 >>  CT Head 2/26 >>   Micro Data:  BCx2 2/26 >>  Sputum 2/26 >>  UC 2/26 >>    Antimicrobials:  Rocephin 2/26 >>   Interim history/subjective:  As above  Objective   Temperature (!) 96.2 F (35.7 C), temperature source Rectal.       No intake or output data in the 24 hours ending 09/17/18 1556 There were no vitals filed for this visit.  Examination: General: chronically ill appearing adult male lying in bed in NAD on vent  HENT: ETT, MM pink/moist Lungs: even/non-labored on vent, lungs bilaterally coarse  Cardiovascular: s1s2 rrr, no m/r/g  Abdomen: obese/soft, bsx4 hypoactive  Extremities: cool/dry, BLE pale, + doppler pulses in BLE's  Neuro: sedate GU: deferred   Resolved Hospital Problem list      Assessment & Plan:   DKA  P: Admit to ICU  Insulin gtt per DKA protocol  NS at 125 ml/ until glucose <250  Follow Q2 BMP  Place central line  AGMA  AKI  Hyponatremia  P: Fluids per DKA protocol  Assess CK to r/o rhabdomyolysis  Trend BMP / urinary output Replace electrolytes as indicated Avoid nephrotoxic agents, ensure adequate renal perfusion  Acute Metabolic Encephalopathy  -in setting of hyperglycemia, hyponatremia, AKI  -unknown downtime  P: CT Head now  Assess EEG  Correct DKA  IVF's per protocol  May need neurology input pending CT head, EEG   Acute Respiratory Insufficiency  -in setting of DKA, Metabolic Derangements  Hx Tobacco, Marijuana Use  P: PRVC 8 cc/kg  Rate 16  Wean PEEP / FiO2 for sats > 90% PRN  albuterol with smoking history   Shock, Leukocytosis  -rule out sepsis  P: Levophed for MAP >65  IVF as above  ICU monitoring  Trend CBC, fever curve  Assess CVP, trend lactate, procalcitonin   Hx HTN, HLD P: Hold home medications > propranolol, amlodipine, atorvastatin   Mild Elevation LFT's  -baseline statin, shock  P: Trend LFT  Malpositioned Central Access  -OSH, guidewire left in place per report  P: Assess arterial US to ensure no pseudoaneurysm  Assess plain film of RLE to r/o residual wire  left in vessel   Best practice:  Diet: NPO  Pain/Anxiety/Delirium protocol (if indicated): Fentanyl gtt  VAP protocol (if indicated): Ordered  DVT prophylaxis: Heparin  GI prophylaxis: PPI  Glucose control: DKA protocol  Mobility: As tolerated  Code Status: Full Code  Family Communication: No family available at time of admission  Disposition: ICU   Labs   CBC: No results for input(s): WBC, NEUTROABS, HGB, HCT, MCV, PLT in the last 168 hours.  Basic Metabolic Panel: No results for input(s): NA, K, CL, CO2, GLUCOSE, BUN, CREATININE, CALCIUM, MG, PHOS in the last 168 hours. GFR: CrCl cannot be calculated (Patient's most recent lab result is older than the maximum 21 days allowed.). No results for input(s): PROCALCITON, WBC, LATICACIDVEN in the last 168 hours.  Liver Function Tests: No results for input(s): AST, ALT, ALKPHOS, BILITOT, PROT, ALBUMIN in the last 168 hours. No results for input(s): LIPASE, AMYLASE in the last 168 hours. No results for input(s): AMMONIA in the last 168 hours.  ABG No results found for: PHART, PCO2ART, PO2ART, HCO3, TCO2, ACIDBASEDEF, O2SAT   Coagulation Profile: No results for input(s): INR, PROTIME in the last 168 hours.  Cardiac Enzymes: No results for input(s): CKTOTAL, CKMB, CKMBINDEX, TROPONINI in the last 168 hours.  HbA1C: No results found for: HGBA1C  CBG: Recent Labs  Lab 09/17/18 1519  GLUCAP 521*    Review of Systems:   Unable to complete as patient is altered on mechanical ventilation.   Past Medical History  He,  has a past medical history of Diabetes mellitus without complication ([place]), Hypercholesteremia, Hypertension, and Scrotal injury.   Surgical History    Past Surgical History:  Procedure Laterality Date  . APPENDECTOMY       Social History   reports that he has quit smoking. His smoking use included cigarettes. He has never used smokeless tobacco. He reports that he does not drink alcohol or use drugs.    Family History   His family history is not on file.   Allergies No Known Allergies   Home Medications  Prior to Admission medications   Medication Sig Start Date End Date Taking? Authorizing Provider  insulin detemir (LEVEMIR) 100 UNIT/ML injection Inject into the skin. 07/24/17   [provider]     Critical care time:  35 minutes      Noe Gens, NP-C [place] [facility] Pgr: [phone] or if no answer [phone] 09/17/2018, 3:56 PM  Attending Note:  58 year old male who was found unresponsive for an unknown period of time who was taken to [facility] and was intubated for airway protection and PCCM was called for admission.  Once carelink presenting to the ER in [facility] the patient had a femoral line that was evidently in the artery with a wire still present and levophed was being infused through it, the patient had a leg that appeared very cyanotic on that side.  That line  was removed and peripheral access was acquired.  Patient was transported to [facility] remaining unresponsive and in DKA.  Admitted to the ICU in [facility], U/S of the femoral artery was performed that revealed no complications to the femoral artery.  CXR is still pending.  Will order the DKA protocol.  Rocephin and zithromax ordered.  Head CT to r/o intracranial abnormalities ordered.  Correct metabolic abnormalities via DKA protocol.  Ordered a full set of labs and will f/u.  The patient is critically ill with multiple organ systems failure and requires high complexity decision making for assessment and support, frequent evaluation and titration of therapies, application of advanced monitoring technologies and extensive interpretation of multiple databases.   Critical Care Time devoted to patient care services described in this note is  45  Minutes. This time reflects time of care of this signee Dr Jennet Maduro. This critical care time does not reflect procedure time, or teaching time or  supervisory time of PA/NP/Med student/Med Resident etc but could involve care discussion time.  Rush Farmer, M.D. [facility] Pulmonary/Critical Care Medicine. Pager: [phone]. After hours pager: [phone].

## 2018-09-18 ENCOUNTER — Inpatient Hospital Stay (HOSPITAL_COMMUNITY): Payer: Medicaid Other

## 2018-09-18 DIAGNOSIS — Z9911 Dependence on respirator [ventilator] status: Secondary | ICD-10-CM

## 2018-09-18 DIAGNOSIS — R4189 Other symptoms and signs involving cognitive functions and awareness: Secondary | ICD-10-CM

## 2018-09-18 LAB — HEPATIC FUNCTION PANEL
ALT: 48 U/L — AB (ref 0–44)
AST: 95 U/L — ABNORMAL HIGH (ref 15–41)
Albumin: 1.7 g/dL — ABNORMAL LOW (ref 3.5–5.0)
Alkaline Phosphatase: 104 U/L (ref 38–126)
Bilirubin, Direct: <0.1 mg/dL (ref 0.0–0.2)
Total Bilirubin: 1.1 mg/dL (ref 0.3–1.2)
Total Protein: 4.2 g/dL — ABNORMAL LOW (ref 6.5–8.1)

## 2018-09-18 LAB — BASIC METABOLIC PANEL
Anion gap: 14 (ref 5–15)
Anion gap: 14 (ref 5–15)
Anion gap: 13 (ref 5–15)
Anion gap: 13 (ref 5–15)
Anion gap: 15 (ref 5–15)
Anion gap: 16 — ABNORMAL HIGH (ref 5–15)
BUN: 66 mg/dL — ABNORMAL HIGH (ref 6–20)
BUN: 63 mg/dL — ABNORMAL HIGH (ref 6–20)
BUN: 65 mg/dL — ABNORMAL HIGH (ref 6–20)
BUN: 69 mg/dL — ABNORMAL HIGH (ref 6–20)
BUN: 69 mg/dL — ABNORMAL HIGH (ref 6–20)
BUN: 72 mg/dL — ABNORMAL HIGH (ref 6–20)
CHLORIDE: 104 mmol/L (ref 98–111)
CHLORIDE: 103 mmol/L (ref 98–111)
CO2: 16 mmol/L — ABNORMAL LOW (ref 22–32)
CO2: 17 mmol/L — ABNORMAL LOW (ref 22–32)
CO2: 17 mmol/L — ABNORMAL LOW (ref 22–32)
CO2: 17 mmol/L — AB (ref 22–32)
CO2: 15 mmol/L — ABNORMAL LOW (ref 22–32)
CO2: 14 mmol/L — ABNORMAL LOW (ref 22–32)
Calcium: 7 mg/dL — ABNORMAL LOW (ref 8.9–10.3)
Calcium: 7 mg/dL — ABNORMAL LOW (ref 8.9–10.3)
Calcium: 7.1 mg/dL — ABNORMAL LOW (ref 8.9–10.3)
Calcium: 6.8 mg/dL — ABNORMAL LOW (ref 8.9–10.3)
Calcium: 6.8 mg/dL — ABNORMAL LOW (ref 8.9–10.3)
Calcium: 6.7 mg/dL — ABNORMAL LOW (ref 8.9–10.3)
Chloride: 103 mmol/L (ref 98–111)
Chloride: 104 mmol/L (ref 98–111)
Chloride: 103 mmol/L (ref 98–111)
Chloride: 102 mmol/L (ref 98–111)
Creatinine, Ser: 3.39 mg/dL — ABNORMAL HIGH (ref 0.61–1.24)
Creatinine, Ser: 3.11 mg/dL — ABNORMAL HIGH (ref 0.61–1.24)
Creatinine, Ser: 3.08 mg/dL — ABNORMAL HIGH (ref 0.61–1.24)
Creatinine, Ser: 3.04 mg/dL — ABNORMAL HIGH (ref 0.61–1.24)
Creatinine, Ser: 3.21 mg/dL — ABNORMAL HIGH (ref 0.61–1.24)
Creatinine, Ser: 3.51 mg/dL — ABNORMAL HIGH (ref 0.61–1.24)
GFR calc Af Amer: 22 mL/min — ABNORMAL LOW (ref >60)
GFR calc Af Amer: 24 mL/min — ABNORMAL LOW (ref >60)
GFR calc Af Amer: 25 mL/min — ABNORMAL LOW (ref >60)
GFR calc Af Amer: 25 mL/min — ABNORMAL LOW (ref >60)
GFR calc Af Amer: 24 mL/min — ABNORMAL LOW (ref >60)
GFR calc Af Amer: 21 mL/min — ABNORMAL LOW (ref >60)
GFR calc non Af Amer: 19 mL/min — ABNORMAL LOW (ref >60)
GFR calc non Af Amer: 21 mL/min — ABNORMAL LOW (ref >60)
GFR calc non Af Amer: 21 mL/min — ABNORMAL LOW (ref >60)
GFR calc non Af Amer: 22 mL/min — ABNORMAL LOW (ref >60)
GFR calc non Af Amer: 20 mL/min — ABNORMAL LOW (ref >60)
GFR calc non Af Amer: 18 mL/min — ABNORMAL LOW (ref >60)
GLUCOSE: 270 mg/dL — AB (ref 70–99)
Glucose, Bld: 210 mg/dL — ABNORMAL HIGH (ref 70–99)
Glucose, Bld: 225 mg/dL — ABNORMAL HIGH (ref 70–99)
Glucose, Bld: 193 mg/dL — ABNORMAL HIGH (ref 70–99)
Glucose, Bld: 206 mg/dL — ABNORMAL HIGH (ref 70–99)
Glucose, Bld: 330 mg/dL — ABNORMAL HIGH (ref 70–99)
Potassium: 3.1 mmol/L — ABNORMAL LOW (ref 3.5–5.1)
Potassium: 3.4 mmol/L — ABNORMAL LOW (ref 3.5–5.1)
Potassium: 4 mmol/L (ref 3.5–5.1)
Potassium: 3.8 mmol/L (ref 3.5–5.1)
Potassium: 3.9 mmol/L (ref 3.5–5.1)
Potassium: 3.9 mmol/L (ref 3.5–5.1)
Sodium: 133 mmol/L — ABNORMAL LOW (ref 135–145)
Sodium: 135 mmol/L (ref 135–145)
Sodium: 133 mmol/L — ABNORMAL LOW (ref 135–145)
Sodium: 134 mmol/L — ABNORMAL LOW (ref 135–145)
Sodium: 133 mmol/L — ABNORMAL LOW (ref 135–145)
Sodium: 132 mmol/L — ABNORMAL LOW (ref 135–145)

## 2018-09-18 LAB — POCT I-STAT 7, (LYTES, BLD GAS, ICA,H+H)
Acid-base deficit: 21 mmol/L — ABNORMAL HIGH (ref 0.0–2.0)
BICARBONATE: 7.5 mmol/L — AB (ref 20.0–28.0)
Calcium, Ion: 1 mmol/L — ABNORMAL LOW (ref 1.15–1.40)
HCT: 36 % — ABNORMAL LOW (ref 39.0–52.0)
Hemoglobin: 12.2 g/dL — ABNORMAL LOW (ref 13.0–17.0)
O2 Saturation: 89 %
PH ART: 7.107 — AB (ref 7.350–7.450)
Patient temperature: 96.2
Potassium: 3.4 mmol/L — ABNORMAL LOW (ref 3.5–5.1)
Sodium: 129 mmol/L — ABNORMAL LOW (ref 135–145)
TCO2: 8 mmol/L — ABNORMAL LOW (ref 22–32)
pCO2 arterial: 23.3 mmHg — ABNORMAL LOW (ref 32.0–48.0)
pO2, Arterial: 70 mmHg — ABNORMAL LOW (ref 83.0–108.0)

## 2018-09-18 LAB — GLUCOSE, CAPILLARY
GLUCOSE-CAPILLARY: 183 mg/dL — AB (ref 70–99)
GLUCOSE-CAPILLARY: 286 mg/dL — AB (ref 70–99)
Glucose-Capillary: 143 mg/dL — ABNORMAL HIGH (ref 70–99)
Glucose-Capillary: 148 mg/dL — ABNORMAL HIGH (ref 70–99)
Glucose-Capillary: 151 mg/dL — ABNORMAL HIGH (ref 70–99)
Glucose-Capillary: 154 mg/dL — ABNORMAL HIGH (ref 70–99)
Glucose-Capillary: 163 mg/dL — ABNORMAL HIGH (ref 70–99)
Glucose-Capillary: 179 mg/dL — ABNORMAL HIGH (ref 70–99)
Glucose-Capillary: 184 mg/dL — ABNORMAL HIGH (ref 70–99)
Glucose-Capillary: 192 mg/dL — ABNORMAL HIGH (ref 70–99)
Glucose-Capillary: 195 mg/dL — ABNORMAL HIGH (ref 70–99)
Glucose-Capillary: 198 mg/dL — ABNORMAL HIGH (ref 70–99)
Glucose-Capillary: 203 mg/dL — ABNORMAL HIGH (ref 70–99)
Glucose-Capillary: 209 mg/dL — ABNORMAL HIGH (ref 70–99)
Glucose-Capillary: 209 mg/dL — ABNORMAL HIGH (ref 70–99)
Glucose-Capillary: 230 mg/dL — ABNORMAL HIGH (ref 70–99)
Glucose-Capillary: 234 mg/dL — ABNORMAL HIGH (ref 70–99)
Glucose-Capillary: 238 mg/dL — ABNORMAL HIGH (ref 70–99)
Glucose-Capillary: 278 mg/dL — ABNORMAL HIGH (ref 70–99)
Glucose-Capillary: 297 mg/dL — ABNORMAL HIGH (ref 70–99)
Glucose-Capillary: 326 mg/dL — ABNORMAL HIGH (ref 70–99)

## 2018-09-18 LAB — PROCALCITONIN: Procalcitonin: 38.94 ng/mL

## 2018-09-18 LAB — LIPASE, BLOOD: Lipase: 388 U/L — ABNORMAL HIGH (ref 11–51)

## 2018-09-18 LAB — LIPID PANEL
Cholesterol: 114 mg/dL (ref 0–200)
HDL: 11 mg/dL — ABNORMAL LOW (ref >40)
LDL Cholesterol: UNDETERMINED mg/dL (ref 0–99)
Total CHOL/HDL Ratio: 10.4 RATIO
Triglycerides: 623 mg/dL — ABNORMAL HIGH (ref <150)
VLDL: UNDETERMINED mg/dL (ref 0–40)

## 2018-09-18 LAB — CK: Total CK: 2047 U/L — ABNORMAL HIGH (ref 49–397)

## 2018-09-18 LAB — HIV ANTIBODY (ROUTINE TESTING W REFLEX): HIV Screen 4th Generation wRfx: NONREACTIVE

## 2018-09-18 LAB — MAGNESIUM: Magnesium: 1.3 mg/dL — ABNORMAL LOW (ref 1.7–2.4)

## 2018-09-18 MED ORDER — MAGNESIUM SULFATE 2 GM/50ML IV SOLN
2.0000 g | Freq: Once | INTRAVENOUS | Status: AC
  Administered 2018-09-18: 2 g via INTRAVENOUS
  Filled 2018-09-18: qty 50

## 2018-09-18 MED ORDER — DEXTROSE-NACL 5-0.45 % IV SOLN
INTRAVENOUS | Status: DC
  Administered 2018-09-18 – 2018-09-20 (×3): via INTRAVENOUS

## 2018-09-18 MED ORDER — INSULIN ASPART 100 UNIT/ML ~~LOC~~ SOLN
0.0000 [IU] | SUBCUTANEOUS | Status: DC
  Administered 2018-09-18: 3 [IU] via SUBCUTANEOUS

## 2018-09-18 MED ORDER — INSULIN GLARGINE 100 UNIT/ML ~~LOC~~ SOLN
15.0000 [IU] | Freq: Every day | SUBCUTANEOUS | Status: DC
  Administered 2018-09-18: 15 [IU] via SUBCUTANEOUS
  Filled 2018-09-18: qty 0.15

## 2018-09-18 MED ORDER — SODIUM CHLORIDE 0.9 % IV SOLN
INTRAVENOUS | Status: DC
  Administered 2018-09-19: 21:00:00 via INTRAVENOUS

## 2018-09-18 MED ORDER — VITAL 1.5 CAL PO LIQD
1000.0000 mL | ORAL | Status: DC
  Filled 2018-09-18 (×2): qty 1000

## 2018-09-18 MED ORDER — INSULIN REGULAR(HUMAN) IN NACL 100-0.9 UT/100ML-% IV SOLN
INTRAVENOUS | Status: DC
  Administered 2018-09-18: 1.7 [IU]/h via INTRAVENOUS
  Administered 2018-09-19: 1 [IU]/h via INTRAVENOUS
  Filled 2018-09-18: qty 100

## 2018-09-18 MED ORDER — DEXTROSE 50 % IV SOLN
25.0000 mL | INTRAVENOUS | Status: DC | PRN
  Administered 2018-09-20 – 2018-09-21 (×4): 25 mL via INTRAVENOUS
  Filled 2018-09-18 (×3): qty 50

## 2018-09-18 MED ORDER — POTASSIUM CHLORIDE 10 MEQ/100ML IV SOLN
10.0000 meq | INTRAVENOUS | Status: AC
  Administered 2018-09-18 (×2): 10 meq via INTRAVENOUS
  Filled 2018-09-18 (×2): qty 100

## 2018-09-18 MED ORDER — CALCIUM GLUCONATE-NACL 1-0.675 GM/50ML-% IV SOLN
1.0000 g | Freq: Once | INTRAVENOUS | Status: AC
  Administered 2018-09-18: 1000 mg via INTRAVENOUS
  Filled 2018-09-18: qty 50

## 2018-09-18 MED ORDER — INSULIN GLARGINE 100 UNIT/ML ~~LOC~~ SOLN
15.0000 [IU] | Freq: Once | SUBCUTANEOUS | Status: AC
  Administered 2018-09-18: 15 [IU] via SUBCUTANEOUS
  Filled 2018-09-18: qty 0.15

## 2018-09-18 MED ORDER — INSULIN REGULAR BOLUS VIA INFUSION
0.0000 [IU] | Freq: Three times a day (TID) | INTRAVENOUS | Status: DC
  Filled 2018-09-18: qty 10

## 2018-09-18 MED ORDER — VITAL HIGH PROTEIN PO LIQD: 1000.0000 mL | ORAL | Status: DC

## 2018-09-18 MED ORDER — PRO-STAT SUGAR FREE PO LIQD
30.0000 mL | Freq: Three times a day (TID) | ORAL | Status: DC
  Administered 2018-09-18: 30 mL via ORAL
  Filled 2018-09-18: qty 30

## 2018-09-18 MED ORDER — INSULIN GLARGINE 100 UNIT/ML ~~LOC~~ SOLN: 30.0000 [IU] | Freq: Every day | SUBCUTANEOUS | Status: DC

## 2018-09-18 NOTE — Progress Notes (Signed)
Notified ELINK MD of Calcium 6.2. Awaiting new orders. Will continue to monitor closely.  Caswell Corwin, RN 09/17/2018 08:15PM

## 2018-09-18 NOTE — Progress Notes (Signed)
CRITICAL VALUE ALERT  Critical Value:  Calcium 7.0, Mag 1.7, Potassium 3.3  Date & Time Notified:  09/18/18 [id number]  Provider Notified: Pola Corn MD  Orders Received/Actions taken: Will replace electrolytes. See MAR. Will continue to monitor closely. Caswell Corwin, RN 09/18/18 2:29 AM

## 2018-09-18 NOTE — Progress Notes (Signed)
Patient transported to CT and back without any complications.  

## 2018-09-18 NOTE — Progress Notes (Signed)
Bedside EEG completed, results pending. 

## 2018-09-18 NOTE — Progress Notes (Signed)
Initial Nutrition Assessment  DOCUMENTATION CODES:   Not applicable  INTERVENTION:  Recommend initiating Vital 1.5 via OGT @ goal rate of 51ml/hr (1320 ml). Pro-stat 80ml TID.  Total intake provides 2280 kcal, 134g protein, and free water.  NUTRITION DIAGNOSIS:   Inadequate oral intake related to inability to eat as evidenced by NPO status.  GOAL:   Provide needs based on ASPEN/SCCM guidelines  MONITOR:   Labs, TF tolerance, I & O's  REASON FOR ASSESSMENT:   Ventilator   ASSESSMENT:   58 year old male w/ PMH of T2DM, HTN, and bipolar disorder. Presented to [facility] ER after being found unresponsive. Once he arrived to ER he was intubated. Admitted to ICU for DKA.   Diabetes coordinator recommended ordering A1C.  Spoke with RN and CCM who reported that there are currently no plans for extubation. They see no issues with beginning TF regimen. Recommend beginning TF via OGT.   No family members were present. Pt opened eyes to voice but did not physically move.  MAP (a-line): 60-70s MV: 15.5 L/min Temp (24hrs), Avg:99.2 F (37.3 C), Min:96.2 F (35.7 C), Max:100.9 F (38.3 C)  Net I/O: +4471 Medications reviewed and include: IV NaCl 137ml/hr, IV Dex 75ml/hr, IV insulin 100units/158ml, Levophed 10.81ml, Vasopressin 11.26ml Labs reviewed: CBG (148-209), Na 133 (L), BUN 65 (H), Creatinine 3.08 (H), Mg 1.3 (L)   NUTRITION - FOCUSED PHYSICAL EXAM:    Most Recent Value  Orbital Region  Mild depletion  Upper Arm Region  No depletion  Thoracic and Lumbar Region  Mild depletion  Buccal Region  Unable to assess  Temple Region  Mild depletion  Clavicle Bone Region  No depletion  Clavicle and Acromion Bone Region  No depletion  Scapular Bone Region  Unable to assess  Dorsal Hand  Unable to assess  Patellar Region  No depletion  Anterior Thigh Region  No depletion  Posterior Calf Region  No depletion  Edema (RD Assessment)  None  Hair  Reviewed  Eyes   Unable to assess  Mouth  Unable to assess  Skin  Reviewed  Nails  Unable to assess       Diet Order:   Diet Order            Diet NPO time specified  Diet effective now              EDUCATION NEEDS:   No education needs have been identified at this time  Skin:  Skin Assessment: Reviewed RN Assessment  Last BM:  PTA  Height:   Ht Readings from Last 1 Encounters:  09/17/18 5' 11.65" (1.82 m)    Weight:   Wt Readings from Last 1 Encounters:  09/17/18 76.9 kg    Ideal Body Weight:  78.18 kg  BMI:  Body mass index is 23.22 kg/m.  Estimated Nutritional Needs:   Kcal:  2300 kcal  Protein:  123-138 g  Fluid:  >/= 2L    [employer] Dietetic Intern

## 2018-09-18 NOTE — Procedures (Signed)
ELECTROENCEPHALOGRAM REPORT   Patient: Omar Wood       Room #: [facility] EEG No. ID: [id number] Age: 58 y.o.        Sex: male Referring Physician: Agarwala Report Date:  09/18/2018        Interpreting Physician: Thana Farr  History: [name] is an 58 y.o. male found unresponsive  Medications:  Insulin, Levophed, Neosynephrine, Pitressin, Fentanyl, Protonix, Versed, Rocephin, Zithromax  Conditions of Recording:  This is a 21 channel routine scalp EEG performed with bipolar and monopolar montages arranged in accordance to the international 10/20 system of electrode placement. One channel was dedicated to EKG recording.  The patient is in the intubated and sedated state.  Description:  The background activity is slow and poorly organized.  It consists of a low voltage, polymorphic delta activity that is continuous and diffusely distributed.  Some faster frequencies are noted intermittently in the central regions as well.   No epileptiform activity is noted.   Hyperventilation and intermittent photic stimulation were not performed.   IMPRESSION: This is an abnormal EEG secondary to general background slowing.  This finding may be seen with a diffuse disturbance that is etiologically nonspecific, but may include a metabolic encephalopathy, among other possibilities.  No epileptiform activity was noted.     Thana Farr, MD Neurology [phone] 09/18/2018, 12:25 PM

## 2018-09-18 NOTE — Progress Notes (Signed)
Inpatient Diabetes Program Recommendations  AACE/ADA: New Consensus Statement on Inpatient Glycemic Control   Target Ranges:  Prepandial:   less than 140 mg/dL      Peak postprandial:   less than 180 mg/dL (1-2 hours)      Critically ill patients:  140 - 180 mg/dL   Results for Omar Wood, Omar Wood (MRN [id number]) as of 09/18/2018 09:06  Ref. Range 09/18/2018 00:40 09/18/2018 01:43 09/18/2018 02:40 09/18/2018 03:42 09/18/2018 04:41 09/18/2018 05:53 09/18/2018 06:52 09/18/2018 07:50 09/18/2018 08:49  Glucose-Capillary Latest Ref Range: 70 - 99 mg/dL 557 (H) 322 (H) 025 (H) 203 (H) 209 (H) 195 (H) 209 (H) 148 (H) 183 (H)  Results for [name], [name] (MRN [id number]) as of 09/18/2018 09:06  Ref. Range 09/17/2018 16:40  Glucose Latest Ref Range: 70 - 99 mg/dL 283 (HH)   Review of Glycemic Control  Outpatient Diabetes medications: Basaglar 40 units QHS, Humalog 15 units TID with meals, Metformin XR 1000 mg BID Current orders for Inpatient glycemic control: IV insulin per DKA order set  Inpatient Diabetes Program Recommendations:   At time of transition from IV Insulin to SQ insulin: Once acidosis is cleared and MD is ready to transition off IV insulin, recommend using ICU Glycemic Control order set with Levemir 30 units Q24H (based on 76.9 x 0.4 units), CBGs Q4H with Novolog correction Q4H. If patient is started on tube feeding, he will need additional Novolog Q4H to cover tube feeding.  HbgA1C: Please consider ordering an A1C to evaluate glycemic control over the past 2-3 months.  Thanks, Orlando Penner, RN, MSN, CDE Diabetes Coordinator Inpatient Diabetes Program [phone] (Team Pager from 8am to 5pm)

## 2018-09-18 NOTE — Progress Notes (Signed)
NAME:  Omar Wood, MRN:  [id number], DOB:  1961/01/05, LOS: 1 ADMISSION DATE:  09/17/2018, CONSULTATION DATE:  09/17/18 REFERRING MD:  Lanier Prude, CHIEF COMPLAINT: AMS  Brief History   58 y/o M w/ PMH significant for T2DM, bipolar d/o with anxiety, polysubstance use (MJ, BZD, tobacco), HTN who was transferred from [facility] 2/26 after being found down for unknown time. AMS, DKA, hypotension requiring pressors and intubated in ED.  Significant Hospital Events   2/26  Admit to [facility] from [facility], DKA, hypotensive   Consults:   Procedures:  L IJ 2/26 >>  LR Art line 2/26 >>  Significant Diagnostic Tests:  R Femoral Arterial Doppler 2/26 >>  CT Head 2/26 >> NAICA EEG 2/27 >>  Micro Data:  BCx2 2/26 >>  Sputum 2/26 >>  UC 2/26 >>   Antimicrobials:  Rocephin 2/26 >>  Zithromax 2/26 >>  Interim history/subjective:  Still intermittently encephalopathic. DKA resolving.  Objective   Blood pressure 110/76, pulse (!) 112, temperature 99.5 F (37.5 C), temperature source Axillary, resp. rate (!) 22, height 5' 11.65" (1.82 m), weight 76.9 kg, SpO2 100 %. CVP:  [7 mmHg-14 mmHg] 14 mmHg  Vent Mode: PRVC FiO2 (%):  [40 %-50 %] 40 % Set Rate:  [16 bmp-18 bmp] 16 bmp Vt Set:  [450 mL-610 mL] 610 mL PEEP:  [5 cmH20] 5 cmH20 Plateau Pressure:  [16 cmH20-24 cmH20] 24 cmH20   Intake/Output Summary (Last 24 hours) at 09/18/2018 1007 Last data filed at 09/18/2018 1000 Gross per 24 hour  Intake 5206.08 ml  Output 635 ml  Net 4571.08 ml   Filed Weights   09/17/18 1530  Weight: 76.9 kg    Examination: General: intermittently alert HENT: ETT in place Lungs: CTAB Cardiovascular: tachycardic Abdomen: slightly tight and distended, +BS Extremities: warm, dry. Palpable DP pulses  Neuro: intermittently alert GU: erythematous scrotum, foley in place  Resolved Hospital Problem list   Malpositioned Central access, resolved.  Assessment & Plan:   DKA  P: Transition  insulin gtt to subq Start tube feeds  Daily BMP Obtain A1c  AGMA  AKI  Hyponatremia  2/2 DKA. Cr downtrending. CK elevated, unknown downtime. P: Fluids per DKA protocol  Trend CK Trend BMP / urinary output Replace electrolytes as indicated Avoid nephrotoxic agents, ensure adequate renal perfusion  Acute Metabolic Encephalopathy  In setting of hyperglycemia, hyponatremia, AKI. CT head NAICA. EEG pending. P: Correct DKA  IVF's per protocol   Acute Respiratory Insufficiency  -in setting of DKA, Metabolic Derangements  Failed SBT this am. Hx Tobacco, Marijuana Use  P: PRVC 8 cc/kg  Rate 16  Wean PEEP / FiO2 for sats > 90% PRN albuterol with smoking history   Shock, Leukocytosis  Intermittently febrile. WBC uptrending. LA normal. Cultures pending. P: Levophed for MAP >65  IVF as above  ICU monitoring  Trend CBC, fever curve  F/u cultures  Hx HTN, HLD P: Hold home medications > propranolol, amlodipine, atorvastatin  Requiring pressors.  Mild Elevation LFT's  -baseline statin, shock  P: Trend LFTs  Best practice:  Diet: NPO  Pain/Anxiety/Delirium protocol (if indicated): Fentanyl gtt  VAP protocol (if indicated): Ordered  DVT prophylaxis: Heparin  GI prophylaxis: PPI  Glucose control: DKA protocol  Mobility: As tolerated  Code Status: Full Code  Family Communication: no family at bedside 2/27  Disposition: ICU   Labs   CBC: Recent Labs  Lab 09/17/18 1640 09/17/18 1750  WBC 11.7*  --   HGB  12.8* 12.2*  HCT 36.1* 36.0*  MCV 81.7  --   PLT 270  --     Basic Metabolic Panel: Recent Labs  Lab 09/17/18 1640 09/17/18 1750 09/17/18 2015 09/18/18 0012 09/18/18 0332 09/18/18 0653  NA 129* 129* 131* 133* 135 133*  K 3.4* 3.4* 2.8* 3.1* 3.4* 4.0  CL 96*  --  101 103 104 103  CO2 9*  --  12* 16* 17* 17*  GLUCOSE 537*  --  409* 270* 210* 225*  BUN 62*  --  65* 66* 63* 65*  CREATININE 3.96*  --  3.77* 3.39* 3.11* 3.08*  CALCIUM 6.2*  --  6.1* 7.0*  7.0* 7.1*  MG  --   --   --  1.3*  --   --    GFR: Estimated Creatinine Clearance: 28.7 mL/min (A) (by C-G formula based on SCr of 3.08 mg/dL (H)). Recent Labs  Lab 09/17/18 1640 09/17/18 1645 09/17/18 1650 09/18/18 0012  PROCALCITON  --  32.99  --  38.94  WBC 11.7*  --   --   --   LATICACIDVEN  --   --  1.7  --     Liver Function Tests: Recent Labs  Lab 09/18/18 0012  AST 95*  ALT 48*  ALKPHOS 104  BILITOT 1.1  PROT 4.2*  ALBUMIN 1.7*   Recent Labs  Lab 09/18/18 0012  LIPASE 388*   No results for input(s): AMMONIA in the last 168 hours.  ABG    Component Value Date/Time   PHART 7.338 (L) 09/17/2018 2315   PCO2ART 28.0 (L) 09/17/2018 2315   PO2ART 80.6 (L) 09/17/2018 2315   HCO3 14.5 (L) 09/17/2018 2315   TCO2 8 (L) 09/17/2018 1750   ACIDBASEDEF 10.0 (H) 09/17/2018 2315   O2SAT 97.1 09/17/2018 2315     Coagulation Profile: No results for input(s): INR, PROTIME in the last 168 hours.  Cardiac Enzymes: Recent Labs  Lab 09/17/18 2015  CKTOTAL 2,482*    HbA1C: No results found for: HGBA1C  CBG: Recent Labs  Lab 09/18/18 0441 09/18/18 0553 09/18/18 0652 09/18/18 0750 09/18/18 0849  GLUCAP 209* 195* 209* 148* 183*    Critical care time:      Ellwood Dense, DO PGY-2,  Family Medicine 09/18/2018 10:15 AM

## 2018-09-19 ENCOUNTER — Inpatient Hospital Stay (HOSPITAL_COMMUNITY): Payer: Medicaid Other

## 2018-09-19 ENCOUNTER — Other Ambulatory Visit: Payer: Self-pay

## 2018-09-19 DIAGNOSIS — Z9289 Personal history of other medical treatment: Secondary | ICD-10-CM

## 2018-09-19 DIAGNOSIS — R6521 Severe sepsis with septic shock: Secondary | ICD-10-CM

## 2018-09-19 DIAGNOSIS — R0689 Other abnormalities of breathing: Secondary | ICD-10-CM

## 2018-09-19 DIAGNOSIS — A419 Sepsis, unspecified organism: Secondary | ICD-10-CM

## 2018-09-19 DIAGNOSIS — Z4659 Encounter for fitting and adjustment of other gastrointestinal appliance and device: Secondary | ICD-10-CM

## 2018-09-19 LAB — BASIC METABOLIC PANEL
Anion gap: 11 (ref 5–15)
Anion gap: 11 (ref 5–15)
Anion gap: 12 (ref 5–15)
Anion gap: 18 — ABNORMAL HIGH (ref 5–15)
BUN: 71 mg/dL — ABNORMAL HIGH (ref 6–20)
BUN: 76 mg/dL — ABNORMAL HIGH (ref 6–20)
BUN: 81 mg/dL — ABNORMAL HIGH (ref 6–20)
BUN: 83 mg/dL — ABNORMAL HIGH (ref 6–20)
CALCIUM: 6.9 mg/dL — AB (ref 8.9–10.3)
CO2: 15 mmol/L — ABNORMAL LOW (ref 22–32)
CO2: 16 mmol/L — ABNORMAL LOW (ref 22–32)
CO2: 16 mmol/L — ABNORMAL LOW (ref 22–32)
CO2: 16 mmol/L — ABNORMAL LOW (ref 22–32)
CREATININE: 4.03 mg/dL — AB (ref 0.61–1.24)
Calcium: 6.2 mg/dL — CL (ref 8.9–10.3)
Calcium: 6.4 mg/dL — CL (ref 8.9–10.3)
Calcium: 7 mg/dL — ABNORMAL LOW (ref 8.9–10.3)
Chloride: 102 mmol/L (ref 98–111)
Chloride: 106 mmol/L (ref 98–111)
Chloride: 107 mmol/L (ref 98–111)
Chloride: 107 mmol/L (ref 98–111)
Creatinine, Ser: 3.39 mg/dL — ABNORMAL HIGH (ref 0.61–1.24)
Creatinine, Ser: 3.66 mg/dL — ABNORMAL HIGH (ref 0.61–1.24)
Creatinine, Ser: 3.87 mg/dL — ABNORMAL HIGH (ref 0.61–1.24)
GFR calc Af Amer: 18 mL/min — ABNORMAL LOW (ref >60)
GFR calc Af Amer: 19 mL/min — ABNORMAL LOW (ref >60)
GFR calc Af Amer: 20 mL/min — ABNORMAL LOW (ref >60)
GFR calc Af Amer: 22 mL/min — ABNORMAL LOW (ref >60)
GFR calc non Af Amer: 15 mL/min — ABNORMAL LOW (ref >60)
GFR, EST NON AFRICAN AMERICAN: 17 mL/min — AB (ref >60)
GFR, EST NON AFRICAN AMERICAN: 16 mL/min — AB (ref >60)
GFR, EST NON AFRICAN AMERICAN: 19 mL/min — AB (ref >60)
Glucose, Bld: 125 mg/dL — ABNORMAL HIGH (ref 70–99)
Glucose, Bld: 194 mg/dL — ABNORMAL HIGH (ref 70–99)
Glucose, Bld: 243 mg/dL — ABNORMAL HIGH (ref 70–99)
Glucose, Bld: 252 mg/dL — ABNORMAL HIGH (ref 70–99)
POTASSIUM: 3 mmol/L — AB (ref 3.5–5.1)
Potassium: 4 mmol/L (ref 3.5–5.1)
Potassium: 3.7 mmol/L (ref 3.5–5.1)
Potassium: 3.9 mmol/L (ref 3.5–5.1)
Sodium: 133 mmol/L — ABNORMAL LOW (ref 135–145)
Sodium: 134 mmol/L — ABNORMAL LOW (ref 135–145)
Sodium: 134 mmol/L — ABNORMAL LOW (ref 135–145)
Sodium: 136 mmol/L (ref 135–145)

## 2018-09-19 LAB — COMPREHENSIVE METABOLIC PANEL
ALK PHOS: 86 U/L (ref 38–126)
ALT: 47 U/L — ABNORMAL HIGH (ref 0–44)
AST: 77 U/L — ABNORMAL HIGH (ref 15–41)
Albumin: 1.7 g/dL — ABNORMAL LOW (ref 3.5–5.0)
Anion gap: 15 (ref 5–15)
BUN: 72 mg/dL — ABNORMAL HIGH (ref 6–20)
CALCIUM: 6.4 mg/dL — AB (ref 8.9–10.3)
CO2: 16 mmol/L — AB (ref 22–32)
Chloride: 105 mmol/L (ref 98–111)
Creatinine, Ser: 3.5 mg/dL — ABNORMAL HIGH (ref 0.61–1.24)
GFR calc non Af Amer: 18 mL/min — ABNORMAL LOW (ref >60)
GFR, EST AFRICAN AMERICAN: 21 mL/min — AB (ref >60)
Glucose, Bld: 128 mg/dL — ABNORMAL HIGH (ref 70–99)
Potassium: 3.5 mmol/L (ref 3.5–5.1)
Sodium: 136 mmol/L (ref 135–145)
Total Bilirubin: 0.3 mg/dL (ref 0.3–1.2)
Total Protein: 4.6 g/dL — ABNORMAL LOW (ref 6.5–8.1)

## 2018-09-19 LAB — GLUCOSE, CAPILLARY
GLUCOSE-CAPILLARY: 145 mg/dL — AB (ref 70–99)
GLUCOSE-CAPILLARY: 146 mg/dL — AB (ref 70–99)
GLUCOSE-CAPILLARY: 169 mg/dL — AB (ref 70–99)
GLUCOSE-CAPILLARY: 172 mg/dL — AB (ref 70–99)
GLUCOSE-CAPILLARY: 219 mg/dL — AB (ref 70–99)
Glucose-Capillary: 150 mg/dL — ABNORMAL HIGH (ref 70–99)
Glucose-Capillary: 133 mg/dL — ABNORMAL HIGH (ref 70–99)
Glucose-Capillary: 119 mg/dL — ABNORMAL HIGH (ref 70–99)
Glucose-Capillary: 118 mg/dL — ABNORMAL HIGH (ref 70–99)
Glucose-Capillary: 124 mg/dL — ABNORMAL HIGH (ref 70–99)
Glucose-Capillary: 124 mg/dL — ABNORMAL HIGH (ref 70–99)
Glucose-Capillary: 147 mg/dL — ABNORMAL HIGH (ref 70–99)
Glucose-Capillary: 144 mg/dL — ABNORMAL HIGH (ref 70–99)
Glucose-Capillary: 164 mg/dL — ABNORMAL HIGH (ref 70–99)
Glucose-Capillary: 162 mg/dL — ABNORMAL HIGH (ref 70–99)
Glucose-Capillary: 168 mg/dL — ABNORMAL HIGH (ref 70–99)
Glucose-Capillary: 179 mg/dL — ABNORMAL HIGH (ref 70–99)
Glucose-Capillary: 178 mg/dL — ABNORMAL HIGH (ref 70–99)
Glucose-Capillary: 180 mg/dL — ABNORMAL HIGH (ref 70–99)
Glucose-Capillary: 162 mg/dL — ABNORMAL HIGH (ref 70–99)
Glucose-Capillary: 172 mg/dL — ABNORMAL HIGH (ref 70–99)
Glucose-Capillary: 203 mg/dL — ABNORMAL HIGH (ref 70–99)
Glucose-Capillary: 219 mg/dL — ABNORMAL HIGH (ref 70–99)
Glucose-Capillary: 204 mg/dL — ABNORMAL HIGH (ref 70–99)
Glucose-Capillary: 157 mg/dL — ABNORMAL HIGH (ref 70–99)

## 2018-09-19 LAB — CK: Total CK: 1557 U/L — ABNORMAL HIGH (ref 49–397)

## 2018-09-19 LAB — CBC
HCT: 29.8 % — ABNORMAL LOW (ref 39.0–52.0)
Hemoglobin: 11 g/dL — ABNORMAL LOW (ref 13.0–17.0)
MCH: 29.1 pg (ref 26.0–34.0)
MCHC: 36.9 g/dL — ABNORMAL HIGH (ref 30.0–36.0)
MCV: 78.8 fL — ABNORMAL LOW (ref 80.0–100.0)
Platelets: 143 10*3/uL — ABNORMAL LOW (ref 150–400)
RBC: 3.78 MIL/uL — ABNORMAL LOW (ref 4.22–5.81)
RDW: 13.1 % (ref 11.5–15.5)
WBC: 14.6 10*3/uL — ABNORMAL HIGH (ref 4.0–10.5)
nRBC: 0 % (ref 0.0–0.2)

## 2018-09-19 LAB — URINALYSIS, ROUTINE W REFLEX MICROSCOPIC
Bilirubin Urine: NEGATIVE
Glucose, UA: 50 mg/dL — AB
Ketones, ur: NEGATIVE mg/dL
Leukocytes,Ua: NEGATIVE
Nitrite: NEGATIVE
PH: 5 (ref 5.0–8.0)
Protein, ur: 30 mg/dL — AB
Specific Gravity, Urine: 1.012 (ref 1.005–1.030)

## 2018-09-19 LAB — PROCALCITONIN: Procalcitonin: 48.69 ng/mL

## 2018-09-19 LAB — URINE CULTURE: Culture: NO GROWTH

## 2018-09-19 LAB — HEMOGLOBIN A1C
Hgb A1c MFr Bld: 11.4 % — ABNORMAL HIGH (ref 4.8–5.6)
Mean Plasma Glucose: 280.48 mg/dL

## 2018-09-19 LAB — SODIUM, URINE, RANDOM: Sodium, Ur: 58 mmol/L

## 2018-09-19 LAB — CREATININE, URINE, RANDOM: Creatinine, Urine: 88.47 mg/dL

## 2018-09-19 MED ORDER — POTASSIUM CHLORIDE 10 MEQ/50ML IV SOLN
10.0000 meq | INTRAVENOUS | Status: AC
  Administered 2018-09-19 (×6): 10 meq via INTRAVENOUS
  Filled 2018-09-19 (×6): qty 50

## 2018-09-19 MED ORDER — DEXMEDETOMIDINE HCL IN NACL 400 MCG/100ML IV SOLN
0.4000 ug/kg/h | INTRAVENOUS | Status: DC
  Administered 2018-09-19: 0.4 ug/kg/h via INTRAVENOUS
  Administered 2018-09-19: 1.2 ug/kg/h via INTRAVENOUS
  Administered 2018-09-20: 0.8 ug/kg/h via INTRAVENOUS
  Administered 2018-09-20: 0.4 ug/kg/h via INTRAVENOUS
  Administered 2018-09-20: 0.6 ug/kg/h via INTRAVENOUS
  Administered 2018-09-20: 1 ug/kg/h via INTRAVENOUS
  Administered 2018-09-21 (×4): 0.8 ug/kg/h via INTRAVENOUS
  Administered 2018-09-22: 0.6 ug/kg/h via INTRAVENOUS
  Filled 2018-09-19 (×2): qty 100
  Filled 2018-09-19: qty 200
  Filled 2018-09-19 (×6): qty 100

## 2018-09-19 MED ORDER — SODIUM CHLORIDE 0.9 % IV SOLN
1.0000 g | Freq: Once | INTRAVENOUS | Status: AC
  Administered 2018-09-19: 1 g via INTRAVENOUS
  Filled 2018-09-19: qty 10

## 2018-09-19 MED ORDER — SODIUM CHLORIDE 0.9 % IV SOLN
2.0000 g | INTRAVENOUS | Status: DC
  Administered 2018-09-19 – 2018-09-20 (×2): 2 g via INTRAVENOUS
  Filled 2018-09-19 (×2): qty 2

## 2018-09-19 NOTE — Progress Notes (Signed)
Patient was weaning on the vent when he became extremely anxious. Patient's O2 saturation dropped into the 70s and heart rate dropped into the 70s, where he has normally been in the 120s. RT was called and put back onto full support. Patient never lost consciousness.

## 2018-09-19 NOTE — Progress Notes (Signed)
NAME:  Omar Wood, MRN:  [id number], DOB:  08-09-1960, LOS: 2 ADMISSION DATE:  09/17/2018, CONSULTATION DATE:  09/17/18 REFERRING MD:  Lanier Prude, CHIEF COMPLAINT: AMS  Brief History   58 y/o M w/ PMH significant for T2DM, bipolar d/o with anxiety, polysubstance use (MJ, BZD, tobacco), HTN who was transferred from [facility] 2/26 after being found down for unknown time. AMS, DKA, hypotension requiring pressors and intubated in ED.  Significant Hospital Events   2/26  Admit to [facility] from [facility], DKA, hypotensive   Consults:   Procedures:  L IJ 2/26 >>  LR Art line 2/26 >>  Significant Diagnostic Tests:  R Femoral Arterial Doppler 2/26 >> negative for guide wire CT Head 2/26 >> NAICA EEG 2/27 >> negative for seizure  Micro Data:  BCx2 2/26 >> no growth  Sputum 2/26 >>  UC 2/26 >> no growth  Antimicrobials:  Rocephin 2/26 >>  Zithromax 2/26 >>  Interim history/subjective:  Intermittently febrile. Almost 1L NG output. Insulin drip continued overnight.  Objective   Blood pressure 94/66, pulse (!) 118, temperature 98.8 F (37.1 C), temperature source Oral, resp. rate (!) 29, height 5' 11.65" (1.82 m), weight 79.6 kg, SpO2 100 %. CVP:  [3 mmHg-14 mmHg] 9 mmHg  Vent Mode: PRVC FiO2 (%):  [40 %] 40 % Set Rate:  [16 bmp] 16 bmp Vt Set:  [610 mL] 610 mL PEEP:  [5 cmH20] 5 cmH20 Plateau Pressure:  [19 cmH20-26 cmH20] 19 cmH20   Intake/Output Summary (Last 24 hours) at 09/19/2018 0741 Last data filed at 09/19/2018 0602 Gross per 24 hour  Intake 2356.84 ml  Output 2250 ml  Net 106.84 ml   Filed Weights   09/17/18 1530 09/19/18 0438  Weight: 76.9 kg 79.6 kg    Examination: General: more alert, following commands HENT: ETT and OG in place Lungs: CTAB Cardiovascular: tachycardic, regular rhythm Abdomen: distended, nonTTP, +BS Extremities: warm and dry Neuro: alert, following commands  Resolved Hospital Problem list   Malpositioned Central access,  resolved.  Assessment & Plan:   DKA  H/o poor control, A1c 11.4. P: Continue insulin gtt and transition to subq once AG and bicarb normalizes. Monitor BMP Start TF once transitioning  AGMA  AKI  Hyponatremia  2/2 DKA. Cr back up this am. CK downtrending. Good UOP. P: Fluids per DKA protocol  Trend CK Trend BMP / urinary output Replace electrolytes as indicated Avoid nephrotoxic agents, ensure adequate renal perfusion  Acute Metabolic Encephalopathy  In setting of hyperglycemia, hyponatremia, AKI. CT head NAICA. EEG negative for seizure. Tox screen negative. P: Correct DKA  IVF's per protocol   Acute Respiratory Insufficiency  In setting of DKA, Metabolic Derangements. F/u CXR.  Hx Tobacco, Marijuana Use  P: PRVC 8 cc/kg  Rate 16  Wean PEEP / FiO2 for sats > 90% PRN albuterol with smoking history   Shock, Leukocytosis  Intermittently febrile, likely 2/2 mild rhabdo. WBC uptrending. LA normal. Sputum cx pending. CXR slightly improved this am. Still requiring pressors. Can consider additional GI imaging given distension if continues to fever. P: Levophed, vasopressin for MAP >65  IVF as above  ICU monitoring  Trend CBC, fever curve  F/u culture Continue abx, consider broadening if continues to fever  Hx HTN, HLD P: Hold home medications > propranolol, amlodipine, atorvastatin  Requiring pressors.  Mild Elevation LFT's  -baseline statin, shock  P: Trend LFTs  Best practice:  Diet: NPO  Pain/Anxiety/Delirium protocol (if indicated): Fentanyl  VAP protocol (if indicated): Ordered  DVT prophylaxis: Heparin  GI prophylaxis: PPI  Glucose control: DKA protocol  Mobility: As tolerated  Code Status: Full Code  Family Communication: no family at bedside 2/28  Disposition: ICU   Labs   CBC: Recent Labs  Lab 09/17/18 1640 09/17/18 1750 09/19/18 0434  WBC 11.7*  --  14.6*  HGB 12.8* 12.2* 11.0*  HCT 36.1* 36.0* 29.8*  MCV 81.7  --  78.8*  PLT 270  --   143*    Basic Metabolic Panel: Recent Labs  Lab 09/18/18 0012  09/18/18 1141 09/18/18 1632 09/18/18 1958 09/18/18 2341 09/19/18 0434  NA 133*   < > 134* 133* 132* 133* 136  K 3.1*   < > 3.8 3.9 3.9 3.0* 3.5  CL 103   < > 104 103 102 106 105  CO2 16*   < > 17* 15* 14* 16* 16*  GLUCOSE 270*   < > 193* 206* 330* 252* 128*  BUN 66*   < > 69* 69* 72* 71* 72*  CREATININE 3.39*   < > 3.04* 3.21* 3.51* 3.39* 3.50*  CALCIUM 7.0*   < > 6.8* 6.8* 6.7* 6.4* 6.4*  MG 1.3*  --   --   --   --   --   --    < > = values in this interval not displayed.   GFR: Estimated Creatinine Clearance: 25.3 mL/min (A) (by C-G formula based on SCr of 3.5 mg/dL (H)). Recent Labs  Lab 09/17/18 1640 09/17/18 1645 09/17/18 1650 09/18/18 0012 09/19/18 0434  PROCALCITON  --  32.99  --  38.94 48.69  WBC 11.7*  --   --   --  14.6*  LATICACIDVEN  --   --  1.7  --   --     Liver Function Tests: Recent Labs  Lab 09/18/18 0012 09/19/18 0434  AST 95* 77*  ALT 48* 47*  ALKPHOS 104 86  BILITOT 1.1 0.3  PROT 4.2* 4.6*  ALBUMIN 1.7* 1.7*   Recent Labs  Lab 09/18/18 0012  LIPASE 388*   No results for input(s): AMMONIA in the last 168 hours.  ABG    Component Value Date/Time   PHART 7.338 (L) 09/17/2018 2315   PCO2ART 28.0 (L) 09/17/2018 2315   PO2ART 80.6 (L) 09/17/2018 2315   HCO3 14.5 (L) 09/17/2018 2315   TCO2 8 (L) 09/17/2018 1750   ACIDBASEDEF 10.0 (H) 09/17/2018 2315   O2SAT 97.1 09/17/2018 2315     Coagulation Profile: No results for input(s): INR, PROTIME in the last 168 hours.  Cardiac Enzymes: Recent Labs  Lab 09/17/18 2015 09/18/18 1141 09/19/18 0434  CKTOTAL 2,482* 2,047* 1,557*    HbA1C: Hgb A1c MFr Bld  Date/Time Value Ref Range Status  09/19/2018 04:34 AM 11.4 (H) 4.8 - 5.6 % Final    Comment:    (NOTE) Pre diabetes:          5.7%-6.4% Diabetes:              >6.4% Glycemic control for   <7.0% adults with diabetes     CBG: Recent Labs  Lab 09/19/18 0334  09/19/18 0433 09/19/18 0531 09/19/18 0626 09/19/18 0731  GLUCAP 133* 119* 118* 124* 124*    Critical care time:      Ellwood Dense, DO PGY-2, [facility] 09/19/2018 7:41 AM

## 2018-09-19 NOTE — Progress Notes (Signed)
CRITICAL VALUE ALERT  Critical Value:  Calcium 6.2  Date & Time Notied:  09/19/2018 1100  Provider Notified: Dr. Tonia Brooms  Orders Received/Actions taken: MD notified and will order an ionized calcium

## 2018-09-19 NOTE — Progress Notes (Signed)
CRITICAL VALUE ALERT  Critical Value:  Calcium 6.4  Date & Time Notied:  09/19/2018 @ 0033  Provider Notified: Pola Corn (MD Deterding)  Orders Received/Actions taken:

## 2018-09-19 NOTE — Progress Notes (Signed)
CRITICAL VALUE ALERT  Critical Value:  Ca-6.9  Date & Time Notied:  09/19/18 2300  Provider Notified: Elink  Orders Received/Actions taken: None at this time.

## 2018-09-19 NOTE — Progress Notes (Signed)
CRITICAL VALUE ALERT  Critical Value:  Calcium 6.4  Date & Time Notied:  09/19/2018 @ 0543  Provider Notified: Pola Corn ( MD Deterding)  Orders Received/Actions taken:

## 2018-09-19 NOTE — Progress Notes (Addendum)
Pharmacy Antibiotic Note  Omar Wood is a 58 y.o. male admitted on 09/17/2018 with pneumonia. He has been treated with ceftriaxone and azithromycin but has not significantly improved. Tracheal aspirate culture now growing few GNRs and moderate beta hemolytic organism, ID pending. Pharmacy has been consulted for cefepime dosing. WBC up 14.6, current AF (temp 99s). LA WNL, PCT elevated in the setting of significant renal dysfunction. Scr up to 3.66 (BL < 1), estimated CrCL ~24 L/min.   Plan: Cefepime 2g IV q24h F/u clinical status, C&S, renal function, de-escalation, LOT  Height: 5' 11.65" (182 cm) Weight: 175 lb 7.8 oz (79.6 kg) IBW/kg (Calculated) : 76.8  Temp (24hrs), Avg:99 F (37.2 C), Min:98.3 F (36.8 C), Max:100.7 F (38.2 C)  Recent Labs  Lab 09/17/18 1640 09/17/18 1650  09/18/18 1632 09/18/18 1958 09/18/18 2341 09/19/18 0434 09/19/18 0802  WBC 11.7*  --   --   --   --   --  14.6*  --   CREATININE 3.96*  --    < > 3.21* 3.51* 3.39* 3.50* 3.66*  LATICACIDVEN  --  1.7  --   --   --   --   --   --    < > = values in this interval not displayed.    Estimated Creatinine Clearance: 24.2 mL/min (A) (by C-G formula based on SCr of 3.66 mg/dL (H)).    No Known Allergies  Antimicrobials this admission: Azithro 2/26 >> 2/28 CTX 2/26 >> 2/28 Cefepime 2/28 >>  Microbiology results: 2/26 MRSA PCR: neg 2/26 Bcx: NG < 24h 2/26 Ucx: collected 2/26 TA cx: GNR, beta hemolytic organism  Thank you for allowing pharmacy to be a part of this patient's care.  Roderic Scarce Zigmund Daniel, PharmD, BCPS PGY2 Infectious Diseases Pharmacy Resident Phone: [phone] 09/19/2018 11:28 AM

## 2018-09-20 ENCOUNTER — Other Ambulatory Visit: Payer: Self-pay

## 2018-09-20 ENCOUNTER — Encounter (HOSPITAL_COMMUNITY): Payer: Self-pay

## 2018-09-20 DIAGNOSIS — N170 Acute kidney failure with tubular necrosis: Secondary | ICD-10-CM

## 2018-09-20 LAB — CULTURE, RESPIRATORY W GRAM STAIN

## 2018-09-20 LAB — GLUCOSE, CAPILLARY
Glucose-Capillary: 102 mg/dL — ABNORMAL HIGH (ref 70–99)
Glucose-Capillary: 116 mg/dL — ABNORMAL HIGH (ref 70–99)
Glucose-Capillary: 118 mg/dL — ABNORMAL HIGH (ref 70–99)
Glucose-Capillary: 122 mg/dL — ABNORMAL HIGH (ref 70–99)
Glucose-Capillary: 124 mg/dL — ABNORMAL HIGH (ref 70–99)
Glucose-Capillary: 133 mg/dL — ABNORMAL HIGH (ref 70–99)
Glucose-Capillary: 140 mg/dL — ABNORMAL HIGH (ref 70–99)
Glucose-Capillary: 146 mg/dL — ABNORMAL HIGH (ref 70–99)
Glucose-Capillary: 196 mg/dL — ABNORMAL HIGH (ref 70–99)
Glucose-Capillary: 69 mg/dL — ABNORMAL LOW (ref 70–99)
Glucose-Capillary: 87 mg/dL (ref 70–99)
Glucose-Capillary: 93 mg/dL (ref 70–99)

## 2018-09-20 LAB — CBC
HEMATOCRIT: 30.3 % — AB (ref 39.0–52.0)
Hemoglobin: 10.7 g/dL — ABNORMAL LOW (ref 13.0–17.0)
MCH: 28.7 pg (ref 26.0–34.0)
MCHC: 35.3 g/dL (ref 30.0–36.0)
MCV: 81.2 fL (ref 80.0–100.0)
Platelets: 141 10*3/uL — ABNORMAL LOW (ref 150–400)
RBC: 3.73 MIL/uL — ABNORMAL LOW (ref 4.22–5.81)
RDW: 13.6 % (ref 11.5–15.5)
WBC: 13.7 10*3/uL — ABNORMAL HIGH (ref 4.0–10.5)
nRBC: 0 % (ref 0.0–0.2)

## 2018-09-20 LAB — BASIC METABOLIC PANEL
Anion gap: 12 (ref 5–15)
Anion gap: 14 (ref 5–15)
Anion gap: 12 (ref 5–15)
BUN: 83 mg/dL — ABNORMAL HIGH (ref 6–20)
BUN: 90 mg/dL — ABNORMAL HIGH (ref 6–20)
BUN: 101 mg/dL — ABNORMAL HIGH (ref 6–20)
CHLORIDE: 109 mmol/L (ref 98–111)
CO2: 15 mmol/L — AB (ref 22–32)
CO2: 12 mmol/L — AB (ref 22–32)
CO2: 16 mmol/L — AB (ref 22–32)
CREATININE: 4.07 mg/dL — AB (ref 0.61–1.24)
Calcium: 6.9 mg/dL — ABNORMAL LOW (ref 8.9–10.3)
Calcium: 6.8 mg/dL — ABNORMAL LOW (ref 8.9–10.3)
Calcium: 7.3 mg/dL — ABNORMAL LOW (ref 8.9–10.3)
Chloride: 110 mmol/L (ref 98–111)
Chloride: 109 mmol/L (ref 98–111)
Creatinine, Ser: 4.1 mg/dL — ABNORMAL HIGH (ref 0.61–1.24)
Creatinine, Ser: 4.39 mg/dL — ABNORMAL HIGH (ref 0.61–1.24)
GFR calc Af Amer: 18 mL/min — ABNORMAL LOW (ref >60)
GFR calc Af Amer: 18 mL/min — ABNORMAL LOW (ref >60)
GFR calc Af Amer: 16 mL/min — ABNORMAL LOW (ref >60)
GFR calc non Af Amer: 15 mL/min — ABNORMAL LOW (ref >60)
GFR calc non Af Amer: 15 mL/min — ABNORMAL LOW (ref >60)
GFR calc non Af Amer: 14 mL/min — ABNORMAL LOW (ref >60)
Glucose, Bld: 154 mg/dL — ABNORMAL HIGH (ref 70–99)
Glucose, Bld: 209 mg/dL — ABNORMAL HIGH (ref 70–99)
Glucose, Bld: 125 mg/dL — ABNORMAL HIGH (ref 70–99)
Potassium: 4.2 mmol/L (ref 3.5–5.1)
Potassium: 4.2 mmol/L (ref 3.5–5.1)
Potassium: 3.7 mmol/L (ref 3.5–5.1)
Sodium: 137 mmol/L (ref 135–145)
Sodium: 135 mmol/L (ref 135–145)
Sodium: 137 mmol/L (ref 135–145)

## 2018-09-20 LAB — CK: Total CK: 655 U/L — ABNORMAL HIGH (ref 49–397)

## 2018-09-20 LAB — CALCIUM, IONIZED: Calcium, Ionized, Serum: 4.1 mg/dL — ABNORMAL LOW (ref 4.5–5.6)

## 2018-09-20 MED ORDER — STERILE WATER FOR INJECTION IV SOLN
INTRAVENOUS | Status: DC
  Administered 2018-09-20 – 2018-09-22 (×6): via INTRAVENOUS
  Filled 2018-09-20 (×8): qty 850

## 2018-09-20 MED ORDER — SODIUM CHLORIDE 0.9 % IV SOLN
1.0000 g | INTRAVENOUS | Status: AC
  Administered 2018-09-20 – 2018-09-23 (×4): 1 g via INTRAVENOUS
  Filled 2018-09-20 (×4): qty 10

## 2018-09-20 MED ORDER — INSULIN DETEMIR 100 UNIT/ML ~~LOC~~ SOLN
10.0000 [IU] | Freq: Two times a day (BID) | SUBCUTANEOUS | Status: DC
  Administered 2018-09-20 (×2): 10 [IU] via SUBCUTANEOUS
  Filled 2018-09-20 (×3): qty 0.1

## 2018-09-20 MED ORDER — INSULIN ASPART 100 UNIT/ML ~~LOC~~ SOLN
2.0000 [IU] | SUBCUTANEOUS | Status: DC
  Administered 2018-09-20 (×3): 2 [IU] via SUBCUTANEOUS
  Administered 2018-09-20: 4 [IU] via SUBCUTANEOUS

## 2018-09-20 MED ORDER — SODIUM CHLORIDE 0.9 % IV SOLN
1.0000 g | Freq: Once | INTRAVENOUS | Status: AC
  Administered 2018-09-20: 1 g via INTRAVENOUS
  Filled 2018-09-20: qty 10

## 2018-09-20 NOTE — Progress Notes (Signed)
NAME:  Omar Wood, MRN:  [id number], DOB:  12-09-1960, LOS: 3 ADMISSION DATE:  09/17/2018, CONSULTATION DATE:  09/17/18 REFERRING MD:  Lanier Prude, CHIEF COMPLAINT: AMS  Brief History   58 y/o M w/ PMH significant for T2DM, bipolar d/o with anxiety, polysubstance use (MJ, BZD, tobacco), HTN who was transferred from [facility] 2/26 after being found down for unknown time. AMS, DKA, hypotension requiring pressors and intubated in ED.  Significant Hospital Events   2/26  Admit to [facility] from [facility], DKA, hypotensive   Consults:   Procedures:  L IJ 2/26 >>  LR Art line 2/26 >>  Significant Diagnostic Tests:  R Femoral Arterial Doppler 2/26 >> negative for guide wire CT Head 2/26 >> NAICA EEG 2/27 >> negative for seizure  Micro Data:  BCx2 2/26 >> no growth  Sputum 2/26 >>  UC 2/26 >> no growth  Antimicrobials:  Rocephin 2/26 >>  Zithromax 2/26 >>  Interim history/subjective:  I intubated on mechanical ventilation no issues overnight care discussed with nephrology.  Objective   Blood pressure 106/84, pulse (!) 102, temperature 99.6 F (37.6 C), temperature source Oral, resp. rate (!) 31, height 5' 11.65" (1.82 m), weight 78.7 kg, SpO2 100 %.    Vent Mode: CPAP;PSV FiO2 (%):  [40 %] 40 % Set Rate:  [16 bmp] 16 bmp Vt Set:  [610 mL] 610 mL PEEP:  [5 cmH20] 5 cmH20 Pressure Support:  [8 cmH20] 8 cmH20 Plateau Pressure:  [20 cmH20-22 cmH20] 20 cmH20   Intake/Output Summary (Last 24 hours) at 09/20/2018 1235 Last data filed at 09/20/2018 1200 Gross per 24 hour  Intake 2348.33 ml  Output 925 ml  Net 1423.33 ml   Filed Weights   09/17/18 1530 09/19/18 0438 09/20/18 0407  Weight: 76.9 kg 79.6 kg 78.7 kg    Examination: General: Elderly male, chronically ill-appearing, no acute distress, intubated and sedated on mechanical ventilation HENT: NCAT, sclera clear Lungs: Bilateral ventilated breath sounds, no crackles no wheeze Cardiovascular: Cardiac, regular,  S1-S2 Abdomen: Mildly distended, nontender to palpation, bowel sounds present,  Extremities: Warm dry no rash Neuro: Alert, invasive commands, on light sedation.,  Resolved Hospital Problem list   Malpositioned Central access, resolved.  Assessment & Plan:   Acute metabolic toxic encephalopathy secondary to DKA  AGMA  AKI  Hyponatremia  P: Avoiding nephrotoxic drugs No acute indication for dialysis Bicarbonate drip started  Acute hypoxemic respiratory failure requiring intubation mechanical ventilation Acute Respiratory Insufficiency  In setting of DKA, Metabolic Derangements. F/u CXR.  Hx Tobacco, Marijuana Use  P: Patient remains on mechanical ventilation. Compensating for his progressive acidosis. Unable to liberate from mechanical ventilator at this moment. Would like to see his acidosis improved before we consider removal of ventilator as a suspect he will tire out from his respiratory rate.  Shock, Leukocytosis  P: Overall multifactorial, in the sputum as well as strep agalactiae -Continue ceftriaxone at this time. -Wean vasopressors to maintain mean arterial pressure greater than 65  Hx HTN, HLD P:  Home blood pressure medications to requiring vasopressors  Mild Elevation LFT's  -baseline statin, shock  P: We will follow Best practice:  Diet: NPO  Pain/Anxiety/Delirium protocol (if indicated): Fentanyl   VAP protocol (if indicated): Ordered  DVT prophylaxis: Heparin  GI prophylaxis: PPI  Glucose control: DKA protocol  Mobility: As tolerated  Code Status: Full Code  Family Communication: no family at bedside 2/28  Disposition: ICU   Labs   CBC: Recent Labs  Lab 09/17/18 1640 09/17/18 1750 09/19/18 0434 09/20/18 0250  WBC 11.7*  --  14.6* 13.7*  HGB 12.8* 12.2* 11.0* 10.7*  HCT 36.1* 36.0* 29.8* 30.3*  MCV 81.7  --  78.8* 81.2  PLT 270  --  143* 141*    Basic Metabolic Panel: Recent Labs  Lab 09/18/18 0012  09/19/18 0802 09/19/18 1534  09/19/18 2046 09/20/18 0250 09/20/18 0933  NA 133*   < > 136 134* 134* 137 135  K 3.1*   < > 4.0 3.7 3.9 4.2 4.2  CL 103   < > 102 107 107 110 109  CO2 16*   < > 16* 15* 16* 15* 12*  GLUCOSE 270*   < > 125* 194* 243* 154* 209*  BUN 66*   < > 76* 81* 83* 83* 90*  CREATININE 3.39*   < > 3.66* 3.87* 4.03* 4.10* 4.07*  CALCIUM 7.0*   < > 6.2* 7.0* 6.9* 6.9* 6.8*  MG 1.3*  --   --   --   --   --   --    < > = values in this interval not displayed.   GFR: Estimated Creatinine Clearance: 21.8 mL/min (A) (by C-G formula based on SCr of 4.07 mg/dL (H)). Recent Labs  Lab 09/17/18 1640 09/17/18 1645 09/17/18 1650 09/18/18 0012 09/19/18 0434 09/20/18 0250  PROCALCITON  --  32.99  --  38.94 48.69  --   WBC 11.7*  --   --   --  14.6* 13.7*  LATICACIDVEN  --   --  1.7  --   --   --     Liver Function Tests: Recent Labs  Lab 09/18/18 0012 09/19/18 0434  AST 95* 77*  ALT 48* 47*  ALKPHOS 104 86  BILITOT 1.1 0.3  PROT 4.2* 4.6*  ALBUMIN 1.7* 1.7*   Recent Labs  Lab 09/18/18 0012  LIPASE 388*   No results for input(s): AMMONIA in the last 168 hours.  ABG    Component Value Date/Time   PHART 7.338 (L) 09/17/2018 2315   PCO2ART 28.0 (L) 09/17/2018 2315   PO2ART 80.6 (L) 09/17/2018 2315   HCO3 14.5 (L) 09/17/2018 2315   TCO2 8 (L) 09/17/2018 1750   ACIDBASEDEF 10.0 (H) 09/17/2018 2315   O2SAT 97.1 09/17/2018 2315     Coagulation Profile: No results for input(s): INR, PROTIME in the last 168 hours.  Cardiac Enzymes: Recent Labs  Lab 09/17/18 2015 09/18/18 1141 09/19/18 0434 09/20/18 0250  CKTOTAL 2,482* 2,047* 1,557* 655*    HbA1C: Hgb A1c MFr Bld  Date/Time Value Ref Range Status  09/19/2018 04:34 AM 11.4 (H) 4.8 - 5.6 % Final    Comment:    (NOTE) Pre diabetes:          5.7%-6.4% Diabetes:              >6.4% Glycemic control for   <7.0% adults with diabetes     CBG: Recent Labs  Lab 09/20/18 0450 09/20/18 0511 09/20/18 0548 09/20/18 0816  09/20/18 1148  GLUCAP 69* 124* 133* 196* 146*    This patient is critically ill with multiple organ system failure; which, requires frequent high complexity decision making, assessment, support, evaluation, and titration of therapies. This was completed through the application of advanced monitoring technologies and extensive interpretation of multiple databases. During this encounter critical care time was devoted to patient care services described in this note for 37 minutes.   Josephine Igo, DO [facility] 09/20/2018 12:35 PM  Personal pager: [phone] If unanswered, please page CCM On-call: [phone]

## 2018-09-20 NOTE — Progress Notes (Signed)
Blood sugar rechecked and is 124. Elink notified and insulin gtt dc/d. CBG's now Q4.

## 2018-09-20 NOTE — Consult Note (Signed)
Reason for Consult: AKI Referring Physician: Valeta Harms, MD  Omar Wood is an 58 y.o. male.  HPI: Omar Wood has a PMH significant for tobacco abuse, polysubstance abuse (marijuana and benzos), HTN, bipolar disorder, and DM who presented to [facility] ED on 09/17/18 after being found unresponsive for an unknown period of time.  He was intubated upon arrival and was hypotensive on pressors.  Workup revealed DKA related to aspiration pneumonia/sepsis.  He was transferred to [facility] ICU on 09/17/18 and has been maintained on the vent as well as pressors.  He has suffered multi-organ failure and we were consulted to help further evaluate and manage his worsening ARF and metabolic acidosis.  The trend in Scr is seen below.  He was also found to have mild rhabdo with peak CK total of 2,482 but has continued to improve and was 655 today.  He was oliguric upon initial presentation but has started to have some increase in UOP over the past 48 hours.  He has been maintained on levo and vasopressin.    Of note, he had been on an ACE inhibitor prior to admission as well as metformin.   Trend in Creatinine: Creatinine, Ser  Date/Time Value Ref Range Status  09/20/2018 09:33 AM 4.07 (H) 0.61 - 1.24 mg/dL Final  09/20/2018 02:50 AM 4.10 (H) 0.61 - 1.24 mg/dL Final  09/19/2018 08:46 PM 4.03 (H) 0.61 - 1.24 mg/dL Final  09/19/2018 03:34 PM 3.87 (H) 0.61 - 1.24 mg/dL Final  09/19/2018 08:02 AM 3.66 (H) 0.61 - 1.24 mg/dL Final  09/19/2018 04:34 AM 3.50 (H) 0.61 - 1.24 mg/dL Final  09/18/2018 11:41 PM 3.39 (H) 0.61 - 1.24 mg/dL Final  09/18/2018 07:58 PM 3.51 (H) 0.61 - 1.24 mg/dL Final  09/18/2018 04:32 PM 3.21 (H) 0.61 - 1.24 mg/dL Final  09/18/2018 11:41 AM 3.04 (H) 0.61 - 1.24 mg/dL Final  09/18/2018 06:53 AM 3.08 (H) 0.61 - 1.24 mg/dL Final  09/18/2018 03:32 AM 3.11 (H) 0.61 - 1.24 mg/dL Final  09/18/2018 12:12 AM 3.39 (H) 0.61 - 1.24 mg/dL Final  09/17/2018 08:15 PM 3.77 (H) 0.61 - 1.24 mg/dL Final   09/17/2018 04:40 PM 3.96 (H) 0.61 - 1.24 mg/dL Final  08/13/2017 08:49 PM 0.73 0.61 - 1.24 mg/dL Final  08/13/2017 05:05 PM 0.75 0.61 - 1.24 mg/dL Final  02/15/2014 05:00 PM 0.46 (L) 0.50 - 1.35 mg/dL Final  12/30/2008 05:05 AM 0.67 0.4 - 1.5 mg/dL Final  12/29/2008 05:40 AM 0.64 0.4 - 1.5 mg/dL Final  12/28/2008 02:32 PM 0.59 0.4 - 1.5 mg/dL Final  12/22/2006 10:10 AM 0.77  Final    PMH:   Past Medical History:  Diagnosis Date  . Bipolar disorder ([place])   . Diabetes mellitus without complication ([place])   . Hypercholesteremia   . Hypertension   . Marijuana user   . Scrotal injury   . Tobacco abuse     PSH:   Past Surgical History:  Procedure Laterality Date  . APPENDECTOMY      Allergies: No Known Allergies  Medications:   Prior to Admission medications   Medication Sig Start Date End Date Taking? Authorizing Provider  atorvastatin (LIPITOR) 40 MG tablet Take 40 mg by mouth daily.   Yes [provider]  DULoxetine (CYMBALTA) 60 MG capsule Take 60 mg by mouth 2 (two) times daily. 07/30/18  Yes [provider]  esomeprazole (NEXIUM) 40 MG capsule Take 40 mg by mouth daily at 12 noon.   Yes [provider]  Insulin  Glargine (BASAGLAR KWIKPEN) 100 UNIT/ML SOPN Inject 40 Units into the skin at bedtime. 07/30/18  Yes [provider]  insulin lispro (HUMALOG) 100 UNIT/ML injection Inject 15 Units into the skin 3 (three) times daily before meals.   Yes [provider]  lisinopril (PRINIVIL,ZESTRIL) 5 MG tablet Take 5 mg by mouth daily. 07/30/18  Yes [provider]  Melatonin 3 MG TABS Take 3 mg by mouth at bedtime.   Yes [provider]  metFORMIN (GLUCOPHAGE-XR) 500 MG 24 hr tablet Take 1,000 mg by mouth 2 (two) times daily. 07/30/18  Yes [provider]  ondansetron (ZOFRAN-ODT) 4 MG disintegrating tablet Take 4 mg by mouth every 6 (six) hours as needed for nausea or vomiting.   Yes [provider]   propranolol (INDERAL) 10 MG tablet Take 10 mg by mouth 3 (three) times daily.   Yes [provider]  QUEtiapine (SEROQUEL) 400 MG tablet Take 400 mg by mouth at bedtime. 07/30/18  Yes [provider]  traZODone (DESYREL) 50 MG tablet Take 50 mg by mouth at bedtime. 07/30/18  Yes [provider]    Inpatient medications: . chlorhexidine gluconate (MEDLINE KIT)  15 mL Mouth Rinse BID  . Chlorhexidine Gluconate Cloth  6 each Topical Daily  . heparin  5,000 Units Subcutaneous Q8H  . insulin aspart  2-6 Units Subcutaneous Q4H  . insulin detemir  10 Units Subcutaneous Q12H  . mouth rinse  15 mL Mouth Rinse 10 times per day  . pantoprazole (PROTONIX) IV  40 mg Intravenous Daily  . sodium chloride flush  10-40 mL Intracatheter Q12H    Discontinued Meds:   Medications Discontinued During This Encounter  Medication Reason  . insulin detemir (LEVEMIR) 100 UNIT/ML injection Discontinued by provider  . lisinopril (PRINIVIL,ZESTRIL) 10 MG tablet Dose change  . phenylephrine (NEOSYNEPHRINE) 10-0.9 MG/250ML-% infusion   . norepinephrine (LEVOPHED) 72m in 2561mpremix infusion   . angiotensin II acetate (GIAPREZA) 2,500 mcg in sodium chloride 0.9 % 500 mL (5 mcg/mL) infusion   . sodium bicarbonate 150 mEq in sterile water 1,000 mL infusion   . insulin regular, human (MYXREDLIN) 100 units/ 100 mL infusion   . LORazepam (ATIVAN) 1 MG tablet Patient Preference  . amLODipine (NORVASC) 5 MG tablet Patient Preference  . dicyclomine (BENTYL) 10 MG capsule Patient Preference  . diphenhydramine-acetaminophen (TYLENOL PM) 25-500 MG TABS tablet Patient Preference  . gabapentin (NEURONTIN) 600 MG tablet Patient Preference  . insulin glargine (LANTUS) injection 15 Units   . midazolam (VERSED) injection 2 mg   . feeding supplement (VITAL HIGH PROTEIN) liquid 1,8,295L Duplicate  . feeding supplement (VITAL 1.5 CAL) liquid 1,000 mL   . feeding supplement (PRO-STAT SUGAR FREE 64) liquid 30  mL   . insulin aspart (novoLOG) injection 0-15 Units   . insulin glargine (LANTUS) injection 30 Units   . dextrose 5 %-0.45 % sodium chloride infusion Duplicate  . phenylephrine (NEOSYNEPHRINE) 40-0.9 MG/250ML-% infusion   . cefTRIAXone (ROCEPHIN) 2 g in sodium chloride 0.9 % 100 mL IVPB   . azithromycin (ZITHROMAX) 500 mg in sodium chloride 0.9 % 250 mL IVPB   . midazolam (VERSED) injection 2 mg   . midazolam (VERSED) injection 2 mg   . insulin regular, human (MYXREDLIN) 100 units/ 100 mL infusion   . insulin regular bolus via infusion 0-10 Units     Social History:  reports that he has quit smoking. His smoking use included cigarettes. He has never used smokeless tobacco.  He reports that he does not drink alcohol or use drugs.  Family History:  No family history on file.  Review of systems not obtained due to patient factors. Weight change: -0.9 kg  Intake/Output Summary (Last 24 hours) at 09/20/2018 1050 Last data filed at 09/20/2018 1000 Gross per 24 hour  Intake 2347.73 ml  Output 1200 ml  Net 1147.73 ml   BP 101/81   Pulse 100   Temp 99.5 F (37.5 C) (Oral)   Resp (!) 31   Ht 5' 11.65" (1.82 m)   Wt 78.7 kg   SpO2 100%   BMI 23.76 kg/m  Vitals:   09/20/18 0837 09/20/18 0900 09/20/18 0930 09/20/18 1000  BP: 111/70 102/82 96/78 101/81  Pulse:  100 100 100  Resp: (!) 25 (!) 30 (!) 28 (!) 31  Temp:      TempSrc:      SpO2:  100% 100% 100%  Weight:      Height:         General appearance: intubated but awake and able to follow simple commands Head: Normocephalic, without obvious abnormality, atraumatic Resp: clear to auscultation bilaterally Cardio: tachy, no rub GI: soft, non-tender; bowel sounds normal; no masses,  no organomegaly Extremities: edema trace edema in gravity dependent distribution  Labs: Basic Metabolic Panel: Recent Labs  Lab 09/18/18 0012  09/18/18 2341 09/19/18 0434 09/19/18 0802 09/19/18 1534 09/19/18 2046 09/20/18 0250  09/20/18 0933  NA 133*   < > 133* 136 136 134* 134* 137 135  K 3.1*   < > 3.0* 3.5 4.0 3.7 3.9 4.2 4.2  CL 103   < > 106 105 102 107 107 110 109  CO2 16*   < > 16* 16* 16* 15* 16* 15* 12*  GLUCOSE 270*   < > 252* 128* 125* 194* 243* 154* 209*  BUN 66*   < > 71* 72* 76* 81* 83* 83* 90*  CREATININE 3.39*   < > 3.39* 3.50* 3.66* 3.87* 4.03* 4.10* 4.07*  ALBUMIN 1.7*  --   --  1.7*  --   --   --   --   --   CALCIUM 7.0*   < > 6.4* 6.4* 6.2* 7.0* 6.9* 6.9* 6.8*   < > = values in this interval not displayed.   Liver Function Tests: Recent Labs  Lab 09/18/18 0012 09/19/18 0434  AST 95* 77*  ALT 48* 47*  ALKPHOS 104 86  BILITOT 1.1 0.3  PROT 4.2* 4.6*  ALBUMIN 1.7* 1.7*   Recent Labs  Lab 09/18/18 0012  LIPASE 388*   No results for input(s): AMMONIA in the last 168 hours. CBC: Recent Labs  Lab 09/17/18 1640 09/17/18 1750 09/19/18 0434 09/20/18 0250  WBC 11.7*  --  14.6* 13.7*  HGB 12.8* 12.2* 11.0* 10.7*  HCT 36.1* 36.0* 29.8* 30.3*  MCV 81.7  --  78.8* 81.2  PLT 270  --  143* 141*   PT/INR: _0 (inr:5) Cardiac Enzymes: ) Recent Labs  Lab 09/17/18 2015 09/18/18 1141 09/19/18 0434 09/20/18 0250  CKTOTAL 2,482* 2,047* 1,557* 655*   CBG: Recent Labs  Lab 09/20/18 0350 09/20/18 0450 09/20/18 0511 09/20/18 0548 09/20/18 0816  GLUCAP 93 69* 124* 133* 196*    Iron Studies: No results for input(s): IRON, TIBC, TRANSFERRIN, FERRITIN in the last 168 hours.  Xrays/Other Studies: Dg Abd 1 View  Result Date: 09/18/2018 CLINICAL DATA:  NG tube placement EXAM: ABDOMEN - 1 VIEW COMPARISON:  09/17/2018 FINDINGS: NG tube tip  is in the proximal stomach. Gaseous distention of large bowel is similar prior study. IMPRESSION: NG tube tip in the proximal stomach. Electronically Signed   By: Rolm Baptise M.D.   On: 09/18/2018 16:29   Dg Chest Port 1 View  Result Date: 09/19/2018 CLINICAL DATA:  Respiratory insufficiency. Endotracheal tube present. EXAM: PORTABLE CHEST  1 VIEW COMPARISON:  09/17/2018 FINDINGS: Endotracheal tube is 3.3 cm above the carina. Left jugular central line tip at the superior cavoatrial junction. Nasogastric tube extends into the abdomen. Patchy interstitial lung densities are again noted and minimally changed. Slightly improved aeration at the right costophrenic angle region. Heart size is upper limits of normal but stable. Negative for a pneumothorax. Evidence for old left clavicle fracture. IMPRESSION: Patchy interstitial lung densities are stable. Findings could represent edema and/or infection. Support apparatuses as described. Electronically Signed   By: Markus Daft M.D.   On: 09/19/2018 12:08     Assessment/Plan: 1.  ARF, non-oliguric.  Presumably ischemic ATN in setting of sepsis with ongoing need for pressors and ACE inhibition.  Thankfully his urine output increased over the past 48 hours and the rate of rise in Scr has slowed.   1. No indication for CVVHD at this time and will continue to follow closely. 2. If metabolic acidosis does not improve with isotonic bicarb, will need to initiate CVVHD. 2. Metabolic acidosis- due to ARF and metformin.  Will restart isotonic bicarb and follow 3. VDRF- per PCCM 4. Acute metabolic encephalopathy- appears to be improving 5. DKA- improved with insulin, however persistent acidosis likely due to ARF and metformin.  Per PCCM 6. Severe sepsis- presumably from aspiration pna. On pressors and abx per PCCM 7. Hypocalcemia- corrected in 8.6.  Will need to replete before starting isotonic bicarb.   Broadus John A Lanijah Warzecha 09/20/2018, 10:50 AM

## 2018-09-20 NOTE — Progress Notes (Signed)
Blood sugar 69. 1/2 amp of D50 given. Insulin drip stopped. Will recheck in 15 minutes.

## 2018-09-21 LAB — GLUCOSE, CAPILLARY
GLUCOSE-CAPILLARY: 110 mg/dL — AB (ref 70–99)
Glucose-Capillary: 119 mg/dL — ABNORMAL HIGH (ref 70–99)
Glucose-Capillary: 124 mg/dL — ABNORMAL HIGH (ref 70–99)
Glucose-Capillary: 125 mg/dL — ABNORMAL HIGH (ref 70–99)
Glucose-Capillary: 133 mg/dL — ABNORMAL HIGH (ref 70–99)
Glucose-Capillary: 59 mg/dL — ABNORMAL LOW (ref 70–99)
Glucose-Capillary: 62 mg/dL — ABNORMAL LOW (ref 70–99)
Glucose-Capillary: 67 mg/dL — ABNORMAL LOW (ref 70–99)
Glucose-Capillary: 82 mg/dL (ref 70–99)
Glucose-Capillary: 87 mg/dL (ref 70–99)
Glucose-Capillary: 90 mg/dL (ref 70–99)

## 2018-09-21 MED ORDER — INSULIN DETEMIR 100 UNIT/ML ~~LOC~~ SOLN
10.0000 [IU] | Freq: Every day | SUBCUTANEOUS | Status: DC
  Administered 2018-09-22 – 2018-09-23 (×2): 10 [IU] via SUBCUTANEOUS
  Filled 2018-09-21 (×4): qty 0.1

## 2018-09-21 MED ORDER — DEXTROSE 5 % IV SOLN
INTRAVENOUS | Status: DC
  Administered 2018-09-21 – 2018-09-22 (×2): via INTRAVENOUS

## 2018-09-21 MED ORDER — PANTOPRAZOLE SODIUM 40 MG PO PACK
40.0000 mg | PACK | Freq: Every day | ORAL | Status: DC
  Administered 2018-09-22 – 2018-09-23 (×2): 40 mg
  Filled 2018-09-21 (×2): qty 20

## 2018-09-21 MED ORDER — PRO-STAT SUGAR FREE PO LIQD
30.0000 mL | Freq: Two times a day (BID) | ORAL | Status: DC
  Administered 2018-09-21 – 2018-09-23 (×5): 30 mL
  Filled 2018-09-21 (×5): qty 30

## 2018-09-21 MED ORDER — INSULIN ASPART 100 UNIT/ML ~~LOC~~ SOLN
2.0000 [IU] | SUBCUTANEOUS | Status: DC
  Administered 2018-09-21 – 2018-09-22 (×2): 2 [IU] via SUBCUTANEOUS
  Administered 2018-09-22 (×2): 4 [IU] via SUBCUTANEOUS
  Administered 2018-09-22: 2 [IU] via SUBCUTANEOUS
  Administered 2018-09-23: 6 [IU] via SUBCUTANEOUS
  Administered 2018-09-23 (×2): 4 [IU] via SUBCUTANEOUS
  Administered 2018-09-23 (×2): 2 [IU] via SUBCUTANEOUS
  Administered 2018-09-24: 4 [IU] via SUBCUTANEOUS
  Administered 2018-09-24: 6 [IU] via SUBCUTANEOUS
  Administered 2018-09-24: 4 [IU] via SUBCUTANEOUS
  Administered 2018-09-24 – 2018-09-25 (×2): 6 [IU] via SUBCUTANEOUS

## 2018-09-21 MED ORDER — ONDANSETRON HCL 4 MG/2ML IJ SOLN
4.0000 mg | Freq: Four times a day (QID) | INTRAMUSCULAR | Status: DC | PRN
  Administered 2018-09-21 – 2018-09-28 (×9): 4 mg via INTRAVENOUS
  Filled 2018-09-21 (×10): qty 2

## 2018-09-21 MED ORDER — VITAL HIGH PROTEIN PO LIQD
1000.0000 mL | ORAL | Status: DC
  Administered 2018-09-21 – 2018-09-22 (×5): 1000 mL

## 2018-09-21 MED ORDER — INSULIN DETEMIR 100 UNIT/ML ~~LOC~~ SOLN
10.0000 [IU] | Freq: Two times a day (BID) | SUBCUTANEOUS | Status: DC
  Administered 2018-09-21: 10 [IU] via SUBCUTANEOUS
  Filled 2018-09-21: qty 0.1

## 2018-09-21 NOTE — Progress Notes (Signed)
NAME:  Omar Wood, MRN:  [id number], DOB:  12/16/1960, LOS: 4 ADMISSION DATE:  09/17/2018, CONSULTATION DATE:  09/17/18 REFERRING MD:  Lanier Prude, CHIEF COMPLAINT: AMS  Brief History   58 y/o M w/ PMH significant for T2DM, bipolar d/o with anxiety, polysubstance use (MJ, BZD, tobacco), HTN who was transferred from [facility] 2/26 after being found down for unknown time. AMS, DKA, hypotension requiring pressors and intubated in ED.  Significant Hospital Events   2/26  Admit to [facility] from [facility], DKA, hypotensive   Consults:   Procedures:  L IJ 2/26 >>  LR Art line 2/26 >>  Significant Diagnostic Tests:  R Femoral Arterial Doppler 2/26 >> negative for guide wire CT Head 2/26 >> NAICA EEG 2/27 >> negative for seizure  Micro Data:  BCx2 2/26 >> no growth  Sputum 2/26 >>  UC 2/26 >> no growth  Antimicrobials:  Rocephin 2/26 >>  Zithromax 2/26 >>  Interim history/subjective:  norepi weaned to off, but remains on vasopressin at shock dose.  Started making urine, 600 UOP today.  Remains on bicarb 125cc/h Wakes easily on precedex.   Objective   Blood pressure 91/74, pulse 82, temperature 98.1 F (36.7 C), temperature source Axillary, resp. rate 16, height 5' 11.65" (1.82 m), weight 80.5 kg, SpO2 100 %.    Vent Mode: PRVC FiO2 (%):  [40 %] 40 % Set Rate:  [16 bmp] 16 bmp Vt Set:  [610 mL] 610 mL PEEP:  [5 cmH20] 5 cmH20 Pressure Support:  [8 cmH20] 8 cmH20 Plateau Pressure:  [20 cmH20-25 cmH20] 24 cmH20   Intake/Output Summary (Last 24 hours) at 09/21/2018 1827 Last data filed at 09/21/2018 1800 Gross per 24 hour  Intake 4228.1 ml  Output 1400 ml  Net 2828.1 ml   Filed Weights   09/19/18 0438 09/20/18 0407 09/21/18 0359  Weight: 79.6 kg 78.7 kg 80.5 kg    Examination: General: Ill-appearing man, ventilated, comfortable HENT: ET tube in place, OG tube in place with tube feeds running, mild scleral edema Lungs: Somewhat coarse bilaterally but no wheezing  or crackles Cardiovascular: Regular, no murmur, 1+ bilateral pretibial edema Abdomen: Slightly distended, nontender, positive bowel sounds Extremities: Some mild erythema both feet, warm, no overt lesion Neuro: Wake and alert on Precedex, follows commands, nods to questions, moves all extremities  Resolved Hospital Problem list   Malpositioned Central access, resolved.  Assessment & Plan:   Acute metabolic toxic encephalopathy secondary to DKA  AGMA  AKI  Hyponatremia  P: Appreciate nephrology's assistance.  His serum creatinine rate of rise is improved, beginning to make urine Continue bicarbonate drip, same rate Follow urine output, BMP closely Avoid nephrotoxins and ensure adequate renal perfusion  Acute hypoxemic respiratory failure requiring intubation mechanical ventilation Acute Respiratory Insufficiency  In setting of DKA, Metabolic Derangements. F/u CXR.  Hx Tobacco, Marijuana Use  P: Mechanical ventilation PRVC 8 cc/kg Initiate spontaneous breathing trials as his hemodynamics acidosis and volume status improve VAP prevention order set  Shock, Leukocytosis  P: Overall multifactorial, Klebsiella in the sputum as well as strep agalactiae Continue ceftriaxone as ordered Weaning pressors effectively.  He is currently on vasopressin alone and I will restart low-dose norepinephrine in order to stop this.  Wean the norepinephrine as able  Hx HTN, HLD P:  Home antihypertensive regimen is on hold  Mild Elevation LFT's  Follow LFT intermittently for resolution  P: We will follow Best practice:  Diet: NPO  Pain/Anxiety/Delirium protocol (if indicated): Fentanyl  VAP protocol (if indicated): Ordered  DVT prophylaxis: Heparin  GI prophylaxis: PPI  Glucose control: Sliding scale insulin per protocol Mobility: As tolerated  Code Status: Full Code  Family Communication: No family present currently 3/1 Disposition: ICU   Labs   CBC: Recent Labs  Lab 09/17/18 1640  09/17/18 1750 09/19/18 0434 09/20/18 0250  WBC 11.7*  --  14.6* 13.7*  HGB 12.8* 12.2* 11.0* 10.7*  HCT 36.1* 36.0* 29.8* 30.3*  MCV 81.7  --  78.8* 81.2  PLT 270  --  143* 141*    Basic Metabolic Panel: Recent Labs  Lab 09/18/18 0012  09/19/18 1534 09/19/18 2046 09/20/18 0250 09/20/18 0933 09/20/18 1954  NA 133*   < > 134* 134* 137 135 137  K 3.1*   < > 3.7 3.9 4.2 4.2 3.7  CL 103   < > 107 107 110 109 109  CO2 16*   < > 15* 16* 15* 12* 16*  GLUCOSE 270*   < > 194* 243* 154* 209* 125*  BUN 66*   < > 81* 83* 83* 90* 101*  CREATININE 3.39*   < > 3.87* 4.03* 4.10* 4.07* 4.39*  CALCIUM 7.0*   < > 7.0* 6.9* 6.9* 6.8* 7.3*  MG 1.3*  --   --   --   --   --   --    < > = values in this interval not displayed.   GFR: Estimated Creatinine Clearance: 20.2 mL/min (A) (by C-G formula based on SCr of 4.39 mg/dL (H)). Recent Labs  Lab 09/17/18 1640 09/17/18 1645 09/17/18 1650 09/18/18 0012 09/19/18 0434 09/20/18 0250  PROCALCITON  --  32.99  --  38.94 48.69  --   WBC 11.7*  --   --   --  14.6* 13.7*  LATICACIDVEN  --   --  1.7  --   --   --     Liver Function Tests: Recent Labs  Lab 09/18/18 0012 09/19/18 0434  AST 95* 77*  ALT 48* 47*  ALKPHOS 104 86  BILITOT 1.1 0.3  PROT 4.2* 4.6*  ALBUMIN 1.7* 1.7*   Recent Labs  Lab 09/18/18 0012  LIPASE 388*   No results for input(s): AMMONIA in the last 168 hours.  ABG    Component Value Date/Time   PHART 7.338 (L) 09/17/2018 2315   PCO2ART 28.0 (L) 09/17/2018 2315   PO2ART 80.6 (L) 09/17/2018 2315   HCO3 14.5 (L) 09/17/2018 2315   TCO2 8 (L) 09/17/2018 1750   ACIDBASEDEF 10.0 (H) 09/17/2018 2315   O2SAT 97.1 09/17/2018 2315     Coagulation Profile: No results for input(s): INR, PROTIME in the last 168 hours.  Cardiac Enzymes: Recent Labs  Lab 09/17/18 2015 09/18/18 1141 09/19/18 0434 09/20/18 0250  CKTOTAL 2,482* 2,047* 1,557* 655*    HbA1C: Hgb A1c MFr Bld  Date/Time Value Ref Range Status    09/19/2018 04:34 AM 11.4 (H) 4.8 - 5.6 % Final    Comment:    (NOTE) Pre diabetes:          5.7%-6.4% Diabetes:              >6.4% Glycemic control for   <7.0% adults with diabetes     CBG: Recent Labs  Lab 09/21/18 0811 09/21/18 1147 09/21/18 1225 09/21/18 1619 09/21/18 1647  GLUCAP 82 62* 110* 67* 125*    Independent CC time 32 minutes  Levy Pupa, MD, PhD 09/21/2018, 6:39 PM [facility] [phone]  or if no answer [phone]

## 2018-09-21 NOTE — Progress Notes (Signed)
Patient ID: Omar Wood, male   DOB: [birth year], 58 y.o.   MRN: [id number] S:Urine output has improved. O:BP 119/83   Pulse 94   Temp 98.7 F (37.1 C) (Oral)   Resp (!) 23   Ht 5' 11.65" (1.82 m)   Wt 80.5 kg   SpO2 100%   BMI 24.30 kg/m   Intake/Output Summary (Last 24 hours) at 09/21/2018 0937 Last data filed at 09/21/2018 0900 Gross per 24 hour  Intake 3561.14 ml  Output 1200 ml  Net 2361.14 ml   Intake/Output: I/O last 3 completed shifts: In: 4633.4 [I.V.:4323.4; IV Piggyback:309.9] Out: 7829 [Urine:1350; Emesis/NG output:225]  Intake/Output this shift:  Total I/O In: 363.2 [I.V.:363.2] Out: -  Weight change: 1.8 kg Gen: intubated and resting comfortably CVS: no rub Resp: occ rhonchi Abd: +BS, soft, NT Ext: 1+ pretibial edema  Recent Labs  Lab 09/18/18 0012  09/19/18 0434 09/19/18 0802 09/19/18 1534 09/19/18 2046 09/20/18 0250 09/20/18 0933 09/20/18 1954  NA 133*   < > 136 136 134* 134* 137 135 137  K 3.1*   < > 3.5 4.0 3.7 3.9 4.2 4.2 3.7  CL 103   < > 105 102 107 107 110 109 109  CO2 16*   < > 16* 16* 15* 16* 15* 12* 16*  GLUCOSE 270*   < > 128* 125* 194* 243* 154* 209* 125*  BUN 66*   < > 72* 76* 81* 83* 83* 90* 101*  CREATININE 3.39*   < > 3.50* 3.66* 3.87* 4.03* 4.10* 4.07* 4.39*  ALBUMIN 1.7*  --  1.7*  --   --   --   --   --   --   CALCIUM 7.0*   < > 6.4* 6.2* 7.0* 6.9* 6.9* 6.8* 7.3*  AST 95*  --  77*  --   --   --   --   --   --   ALT 48*  --  47*  --   --   --   --   --   --    < > = values in this interval not displayed.   Liver Function Tests: Recent Labs  Lab 09/18/18 0012 09/19/18 0434  AST 95* 77*  ALT 48* 47*  ALKPHOS 104 86  BILITOT 1.1 0.3  PROT 4.2* 4.6*  ALBUMIN 1.7* 1.7*   Recent Labs  Lab 09/18/18 0012  LIPASE 388*   No results for input(s): AMMONIA in the last 168 hours. CBC: Recent Labs  Lab 09/17/18 1640 09/17/18 1750 09/19/18 0434 09/20/18 0250  WBC 11.7*  --  14.6* 13.7*  HGB 12.8* 12.2* 11.0* 10.7*  HCT  36.1* 36.0* 29.8* 30.3*  MCV 81.7  --  78.8* 81.2  PLT 270  --  143* 141*   Cardiac Enzymes: Recent Labs  Lab 09/17/18 2015 09/18/18 1141 09/19/18 0434 09/20/18 0250  CKTOTAL 2,482* 2,047* 1,557* 655*   CBG: Recent Labs  Lab 09/21/18 0244 09/21/18 0309 09/21/18 0431 09/21/18 0542 09/21/18 0811  GLUCAP 59* 119* 90 87 82    Iron Studies: No results for input(s): IRON, TIBC, TRANSFERRIN, FERRITIN in the last 72 hours. Studies/Results: Dg Chest Port 1 View  Result Date: 09/19/2018 CLINICAL DATA:  Respiratory insufficiency. Endotracheal tube present. EXAM: PORTABLE CHEST 1 VIEW COMPARISON:  09/17/2018 FINDINGS: Endotracheal tube is 3.3 cm above the carina. Left jugular central line tip at the superior cavoatrial junction. Nasogastric tube extends into the abdomen. Patchy interstitial lung densities are again noted and minimally  changed. Slightly improved aeration at the right costophrenic angle region. Heart size is upper limits of normal but stable. Negative for a pneumothorax. Evidence for old left clavicle fracture. IMPRESSION: Patchy interstitial lung densities are stable. Findings could represent edema and/or infection. Support apparatuses as described. Electronically Signed   By: Markus Daft M.D.   On: 09/19/2018 12:08   . chlorhexidine gluconate (MEDLINE KIT)  15 mL Mouth Rinse BID  . Chlorhexidine Gluconate Cloth  6 each Topical Daily  . heparin  5,000 Units Subcutaneous Q8H  . insulin aspart  2-6 Units Subcutaneous Q4H  . insulin detemir  10 Units Subcutaneous Q12H  . mouth rinse  15 mL Mouth Rinse 10 times per day  . pantoprazole (PROTONIX) IV  40 mg Intravenous Daily  . sodium chloride flush  10-40 mL Intracatheter Q12H    BMET    Component Value Date/Time   NA 137 09/20/2018 1954   K 3.7 09/20/2018 1954   CL 109 09/20/2018 1954   CO2 16 (L) 09/20/2018 1954   GLUCOSE 125 (H) 09/20/2018 1954   BUN 101 (H) 09/20/2018 1954   CREATININE 4.39 (H) 09/20/2018 1954    CALCIUM 7.3 (L) 09/20/2018 1954   GFRNONAA 14 (L) 09/20/2018 1954   GFRAA 16 (L) 09/20/2018 1954   CBC    Component Value Date/Time   WBC 13.7 (H) 09/20/2018 0250   RBC 3.73 (L) 09/20/2018 0250   HGB 10.7 (L) 09/20/2018 0250   HCT 30.3 (L) 09/20/2018 0250   PLT 141 (L) 09/20/2018 0250   MCV 81.2 09/20/2018 0250   MCH 28.7 09/20/2018 0250   MCHC 35.3 09/20/2018 0250   RDW 13.6 09/20/2018 0250   LYMPHSABS 2.9 08/13/2017 1705   MONOABS 0.7 08/13/2017 1705   EOSABS 0.0 08/13/2017 1705   BASOSABS 0.0 08/13/2017 1705    Assessment/Plan: 1.  ARF, non-oliguric.  Presumably ischemic ATN in setting of sepsis with ongoing need for pressors and ACE inhibition.  Thankfully his urine output increased over the past 48 hours and the rate of rise in Scr has slowed.   1. UOP improving 2. No indication for CVVHD at this time and will continue to follow closely. 3. If metabolic acidosis does not improve with isotonic bicarb, will need to initiate CVVHD, however CO2 is up to 16. 2. Metabolic acidosis- due to ARF and metformin.   Responding to isotonic bicarb.  Continue to follow 3. VDRF- per PCCM 4. Acute metabolic encephalopathy- appears to be improving 5. DKA- improved with insulin, however persistent acidosis likely due to ARF and metformin.  Per PCCM 6. Severe sepsis- presumably from aspiration pna. On pressors and abx per PCCM 7. Hypocalcemia- received IV calcium prior to starting isotonic bicarb.  Ca improved to 7.3 which is corrected to 9 with his hypoalbuminemia.  Donetta Potts, MD [employer] [phone]

## 2018-09-22 ENCOUNTER — Inpatient Hospital Stay (HOSPITAL_COMMUNITY): Payer: Medicaid Other

## 2018-09-22 LAB — COMPREHENSIVE METABOLIC PANEL
ALT: 815 U/L — ABNORMAL HIGH (ref 0–44)
ANION GAP: 12 (ref 5–15)
AST: 781 U/L — ABNORMAL HIGH (ref 15–41)
Albumin: 1.2 g/dL — ABNORMAL LOW (ref 3.5–5.0)
Alkaline Phosphatase: 179 U/L — ABNORMAL HIGH (ref 38–126)
BUN: 94 mg/dL — ABNORMAL HIGH (ref 6–20)
CO2: 30 mmol/L (ref 22–32)
Calcium: 6.3 mg/dL — CL (ref 8.9–10.3)
Chloride: 99 mmol/L (ref 98–111)
Creatinine, Ser: 3.95 mg/dL — ABNORMAL HIGH (ref 0.61–1.24)
GFR calc Af Amer: 18 mL/min — ABNORMAL LOW (ref >60)
GFR calc non Af Amer: 16 mL/min — ABNORMAL LOW (ref >60)
GLUCOSE: 122 mg/dL — AB (ref 70–99)
Potassium: 2.3 mmol/L — CL (ref 3.5–5.1)
Sodium: 141 mmol/L (ref 135–145)
Total Bilirubin: 0.7 mg/dL (ref 0.3–1.2)
Total Protein: 4.3 g/dL — ABNORMAL LOW (ref 6.5–8.1)

## 2018-09-22 LAB — CULTURE, BLOOD (ROUTINE X 2)
Culture: NO GROWTH
Culture: NO GROWTH

## 2018-09-22 LAB — BASIC METABOLIC PANEL
Anion gap: 8 (ref 5–15)
BUN: 89 mg/dL — ABNORMAL HIGH (ref 6–20)
CALCIUM: 6.4 mg/dL — AB (ref 8.9–10.3)
CO2: 34 mmol/L — ABNORMAL HIGH (ref 22–32)
Chloride: 100 mmol/L (ref 98–111)
Creatinine, Ser: 3.73 mg/dL — ABNORMAL HIGH (ref 0.61–1.24)
GFR calc non Af Amer: 17 mL/min — ABNORMAL LOW (ref >60)
GFR, EST AFRICAN AMERICAN: 20 mL/min — AB (ref >60)
Glucose, Bld: 139 mg/dL — ABNORMAL HIGH (ref 70–99)
Potassium: 3 mmol/L — ABNORMAL LOW (ref 3.5–5.1)
Sodium: 142 mmol/L (ref 135–145)

## 2018-09-22 LAB — CBC
HCT: 26.8 % — ABNORMAL LOW (ref 39.0–52.0)
Hemoglobin: 9.4 g/dL — ABNORMAL LOW (ref 13.0–17.0)
MCH: 29.2 pg (ref 26.0–34.0)
MCHC: 35.1 g/dL (ref 30.0–36.0)
MCV: 83.2 fL (ref 80.0–100.0)
Platelets: DECREASED 10*3/uL (ref 150–400)
RBC: 3.22 MIL/uL — ABNORMAL LOW (ref 4.22–5.81)
RDW: 13.2 % (ref 11.5–15.5)
WBC: 8.3 10*3/uL (ref 4.0–10.5)
nRBC: 0 % (ref 0.0–0.2)

## 2018-09-22 LAB — GLUCOSE, CAPILLARY
Glucose-Capillary: 101 mg/dL — ABNORMAL HIGH (ref 70–99)
Glucose-Capillary: 112 mg/dL — ABNORMAL HIGH (ref 70–99)
Glucose-Capillary: 138 mg/dL — ABNORMAL HIGH (ref 70–99)
Glucose-Capillary: 143 mg/dL — ABNORMAL HIGH (ref 70–99)
Glucose-Capillary: 148 mg/dL — ABNORMAL HIGH (ref 70–99)
Glucose-Capillary: 159 mg/dL — ABNORMAL HIGH (ref 70–99)

## 2018-09-22 LAB — MAGNESIUM: Magnesium: 1.5 mg/dL — ABNORMAL LOW (ref 1.7–2.4)

## 2018-09-22 LAB — POTASSIUM: POTASSIUM: 2.5 mmol/L — AB (ref 3.5–5.1)

## 2018-09-22 LAB — PHOSPHORUS: Phosphorus: 1.6 mg/dL — ABNORMAL LOW (ref 2.5–4.6)

## 2018-09-22 MED ORDER — VITAL AF 1.2 CAL PO LIQD
1000.0000 mL | ORAL | Status: DC
  Administered 2018-09-22: 1000 mL

## 2018-09-22 MED ORDER — POTASSIUM CHLORIDE 10 MEQ/100ML IV SOLN
10.0000 meq | INTRAVENOUS | Status: AC
  Administered 2018-09-22 (×3): 10 meq via INTRAVENOUS
  Filled 2018-09-22 (×3): qty 100

## 2018-09-22 MED ORDER — SODIUM CHLORIDE 0.9 % IV SOLN
INTRAVENOUS | Status: DC
  Administered 2018-09-22 – 2018-09-23 (×2): via INTRAVENOUS

## 2018-09-22 MED ORDER — POTASSIUM CHLORIDE 10 MEQ/100ML IV SOLN
10.0000 meq | INTRAVENOUS | Status: AC
  Administered 2018-09-22 – 2018-09-23 (×4): 10 meq via INTRAVENOUS
  Filled 2018-09-22 (×3): qty 100

## 2018-09-22 MED ORDER — QUETIAPINE FUMARATE 100 MG PO TABS
100.0000 mg | ORAL_TABLET | Freq: Every day | ORAL | Status: DC
  Administered 2018-09-22: 100 mg
  Filled 2018-09-22: qty 1

## 2018-09-22 MED ORDER — TRAZODONE HCL 50 MG PO TABS
50.0000 mg | ORAL_TABLET | Freq: Every day | ORAL | Status: DC
  Administered 2018-09-22: 50 mg
  Filled 2018-09-22: qty 1

## 2018-09-22 MED ORDER — CALCIUM GLUCONATE-NACL 1-0.675 GM/50ML-% IV SOLN
1.0000 g | Freq: Once | INTRAVENOUS | Status: AC
  Administered 2018-09-22: 1000 mg via INTRAVENOUS
  Filled 2018-09-22: qty 50

## 2018-09-22 MED ORDER — CALCIUM GLUCONATE-NACL 1-0.675 GM/50ML-% IV SOLN
1.0000 g | Freq: Once | INTRAVENOUS | Status: AC
  Administered 2018-09-22: 1000 mg via INTRAVENOUS
  Filled 2018-09-22 (×2): qty 50

## 2018-09-22 MED ORDER — POTASSIUM CHLORIDE 20 MEQ/15ML (10%) PO SOLN
40.0000 meq | ORAL | Status: AC
  Administered 2018-09-22 (×2): 40 meq
  Filled 2018-09-22 (×2): qty 30

## 2018-09-22 MED ORDER — MIDAZOLAM HCL 2 MG/2ML IJ SOLN
2.0000 mg | INTRAMUSCULAR | Status: DC | PRN
  Administered 2018-09-22 – 2018-09-23 (×7): 2 mg via INTRAVENOUS
  Filled 2018-09-22 (×7): qty 2

## 2018-09-22 NOTE — Progress Notes (Signed)
NAME:  Omar Wood, MRN:  [id number], DOB:  1961/05/01, LOS: 5 ADMISSION DATE:  09/17/2018, CONSULTATION DATE:  09/17/18 REFERRING MD:  Lanier Prude, CHIEF COMPLAINT: AMS  Brief History   58 yo male smoker transferred from [facility] 2/26 with DKA with altered mental status, hypotension, and respiratory failure.  Past Medical History  DM, Bipolar, Anxiety, Polysubstance abuse, HTN, HLD  Significant Hospital Events   2/26  Admit to [facility] from [facility], DKA, hypotensive   Consults:  Nephrology 2/29 >>  Procedures:  ETT 2/26 >>  Lt IJ 2/26 >>  Lt radial A line 2/26 >>  Significant Diagnostic Tests:  R Femoral Arterial Doppler 2/26 >> negative for guide wire CT Head 2/26 >> mild volume loss, no acute findings EEG 2/27 >> negative for seizure  Micro Data:  Urine 2/26 >> negative Sputum 2/26 >> Group B Strep, few Klebsiella Blood 2/26 >> negative  Antimicrobials:  Rocephin 2/26 >>  Zithromax 2/26 >> 2/27 Cefepime 2/28 >> 2/29  Interim history/subjective:  Still needing pressors intermittently.  Remains on full vent support, sedation.  Bradycardia with precedex.  Objective   Blood pressure 95/71, pulse 66, temperature 98.7 F (37.1 C), temperature source Axillary, resp. rate 16, height 5' 11.65" (1.82 m), weight 82.2 kg, SpO2 100 %.    Vent Mode: PRVC FiO2 (%):  [40 %] 40 % Set Rate:  [16 bmp] 16 bmp Vt Set:  [610 mL] 610 mL PEEP:  [5 cmH20] 5 cmH20 Plateau Pressure:  [16 cmH20-24 cmH20] 16 cmH20   Intake/Output Summary (Last 24 hours) at 09/22/2018 1049 Last data filed at 09/22/2018 2355 Gross per 24 hour  Intake 4877.9 ml  Output 2100 ml  Net 2777.9 ml   Filed Weights   09/20/18 0407 09/21/18 0359 09/22/18 0341  Weight: 78.7 kg 80.5 kg 82.2 kg    Examination:  General - sedated Eyes - pupils reactive ENT - ETT in place Cardiac - regular rate/rhythm, no murmur Chest - b/l crackles Abdomen - soft, non tender, + bowel sounds Extremities - no  cyanosis, clubbing, or edema Skin - no rashes Neuro - RASS -1  CXR - b/l ASD (reviewed by me)  Assessment & Plan:   Acute respiratory failure with hypoxia from PNA with Group B strep and Klebsiella. Plan - full vent support - f/u CXR - day 6 of ABx  Septic shock from PNA. Plan - pressors to keep MAP > 65  Acute renal failure from ATN. Plan - f/u BMET - HCO3 d/c'ed from IV fluid  DM type II. Plan - SSI - hold outpt metformin  Hx of HTN, HLD. Plan - hold outpt lipitor, lisinopril, inderal  Elevated LFTs in setting of shock. Plan - f/u ABd u/s  Acute metabolic encephalopathy. Hx of bipolar disease. Bradycardia with precedex. Plan - RASS goal 0 to -1 - add seroquel 100 mg qhs and trazodone 50 mg qhs - hold outpt cymbalta   Best practice:  Diet: NPO  Pain/Anxiety/Delirium protocol (if indicated): Fentanyl   VAP protocol (if indicated): Ordered  DVT prophylaxis: Heparin  GI prophylaxis: PPI  Glucose control: Sliding scale insulin per protocol Mobility: As tolerated  Code Status: Full Code  Family Communication: No family present currently 3/1 Disposition: ICU   Labs    CMP Latest Ref Rng & Units 09/22/2018 09/20/2018 09/20/2018  Glucose 70 - 99 mg/dL 732(K) 025(K) 270(W)  BUN 6 - 20 mg/dL 23(J) 628(B) 15(V)  Creatinine 0.61 - 1.24 mg/dL 7.61(Y) 0.73(X) 1.06(Y)  Sodium 135 - 145 mmol/L 141 137 135  Potassium 3.5 - 5.1 mmol/L 2.3(LL) 3.7 4.2  Chloride 98 - 111 mmol/L 99 109 109  CO2 22 - 32 mmol/L 30 16(L) 12(L)  Calcium 8.9 - 10.3 mg/dL 6.3(LL) 7.3(L) 6.8(L)  Total Protein 6.5 - 8.1 g/dL 4.3(L) - -  Total Bilirubin 0.3 - 1.2 mg/dL 0.7 - -  Alkaline Phos 38 - 126 U/L 179(H) - -  AST 15 - 41 U/L 781(H) - -  ALT 0 - 44 U/L 815(H) - -   CBC Latest Ref Rng & Units 09/22/2018 09/20/2018 09/19/2018  WBC 4.0 - 10.5 K/uL 8.3 13.7(H) 14.6(H)  Hemoglobin 13.0 - 17.0 g/dL 7.8(G) 10.7(L) 11.0(L)  Hematocrit 39.0 - 52.0 % 26.8(L) 30.3(L) 29.8(L)  Platelets 150 - 400  K/uL PLATELET CLUMPS NOTED ON SMEAR, COUNT APPEARS DECREASED 141(L) 143(L)   CBG (last 3)  Recent Labs    09/21/18 2352 09/22/18 0410 09/22/18 0755  GLUCAP 124* 112* 101*   ABG    Component Value Date/Time   PHART 7.338 (L) 09/17/2018 2315   PCO2ART 28.0 (L) 09/17/2018 2315   PO2ART 80.6 (L) 09/17/2018 2315   HCO3 14.5 (L) 09/17/2018 2315   TCO2 8 (L) 09/17/2018 1750   ACIDBASEDEF 10.0 (H) 09/17/2018 2315   O2SAT 97.1 09/17/2018 2315   CC time 33 minutes  Coralyn Helling, MD [facility] Pulmonary/Critical Care 09/22/2018, 11:10 AM

## 2018-09-22 NOTE — Progress Notes (Signed)
Omar Wood NEPHROLOGY PROGRESS NOTE  Assessment/ Plan: Pt is a 58 y.o. yo male male with anxiety bipolar, polysubstance abuse, type 2 diabetes, hypertension transferred from [facility] rocking him after found down for unknown time with AMS, DKA and shock.  #Nonoliguric AKI due to ATN in the setting of septic shock and ACE inhibition.  Patient with urine output of 2.1 L in 24 hours with serum creatinine level trending down to 3.9 today.  No need for dialysis.  Monitor urine output, electrolytes and serum creatinine level.  Avoid IV contrast NSAIDs. -Discontinue sodium bicarb and fluid and change to normal saline.  #Hypokalemia in the setting of recovering AKI with increased urinary loss.  Received IV potassium chloride.  Monitor lab.  #Metabolic acidosis improved.  Discontinue sodium bicarb  #Ventilator dependent respiratory failure per PCCM  #Acute metabolic encephalopathy: Following commands  #DKA improved  #Hypotension: Blood pressure is better now. off Pressors this morning.  Subjective: Seen and examined in ICU.  Remains intubated.  Has significant amount of urine output.  Following simple commands. Objective Vital signs in last 24 hours: Vitals:   09/22/18 0630 09/22/18 0700 09/22/18 0738 09/22/18 0800  BP: 117/72 102/72  92/63  Pulse: 68 67 68 65  Resp: 16 16 (!) 27 (!) 22  Temp:   98.7 F (37.1 C)   TempSrc:   Axillary   SpO2: 100% 100% 100% 99%  Weight:      Height:       Weight change: 1.7 kg  Intake/Output Summary (Last 24 hours) at 09/22/2018 0945 Last data filed at 09/22/2018 0800 Gross per 24 hour  Intake 5061.09 ml  Output 2100 ml  Net 2961.09 ml       Labs: Basic Metabolic Panel: Recent Labs  Lab 09/20/18 0933 09/20/18 1954 09/22/18 0409  NA 135 137 141  K 4.2 3.7 2.3*  CL 109 109 99  CO2 12* 16* 30  GLUCOSE 209* 125* 122*  BUN 90* 101* 94*  CREATININE 4.07* 4.39* 3.95*  CALCIUM 6.8* 7.3* 6.3*   Liver Function Tests: Recent Labs   Lab 09/18/18 0012 09/19/18 0434 09/22/18 0409  AST 95* 77* 781*  ALT 48* 47* 815*  ALKPHOS 104 86 179*  BILITOT 1.1 0.3 0.7  PROT 4.2* 4.6* 4.3*  ALBUMIN 1.7* 1.7* 1.2*   Recent Labs  Lab 09/18/18 0012  LIPASE 388*   No results for input(s): AMMONIA in the last 168 hours. CBC: Recent Labs  Lab 09/17/18 1640  09/19/18 0434 09/20/18 0250 09/22/18 0409  WBC 11.7*  --  14.6* 13.7* 8.3  HGB 12.8*   < > 11.0* 10.7* 9.4*  HCT 36.1*   < > 29.8* 30.3* 26.8*  MCV 81.7  --  78.8* 81.2 83.2  PLT 270  --  143* 141* PLATELET CLUMPS NOTED ON SMEAR, COUNT APPEARS DECREASED   < > = values in this interval not displayed.   Cardiac Enzymes: Recent Labs  Lab 09/17/18 2015 09/18/18 1141 09/19/18 0434 09/20/18 0250  CKTOTAL 2,482* 2,047* 1,557* 655*   CBG: Recent Labs  Lab 09/21/18 1647 09/21/18 1956 09/21/18 2352 09/22/18 0410 09/22/18 0755  GLUCAP 125* 133* 124* 112* 101*    Iron Studies: No results for input(s): IRON, TIBC, TRANSFERRIN, FERRITIN in the last 72 hours. Studies/Results: Dg Chest Port 1 View  Result Date: 09/22/2018 CLINICAL DATA:  Respiratory failure with hypoxia EXAM: PORTABLE CHEST 1 VIEW COMPARISON:  09/19/2018, 09/17/2018, 09/05/2018 FINDINGS: Endotracheal tube tip is about 1.5 cm superior to the  carina. Esophageal tube tip overlies the proximal stomach. Left-sided central venous catheter tip over the mid right atrium. Worsening consolidation at the left base. Worsening bilateral ground-glass opacity. Probable small left effusion. Stable cardiomediastinal silhouette. Old left clavicle fracture IMPRESSION: 1. Endotracheal tube tip about 1.5 cm superior to carina. Left IJ central venous catheter tip projects over the mid right atrium 2. Interval worsening of bilateral ground-glass opacity with worsened consolidation at the left lung base. Electronically Signed   By: Donavan Foil M.D.   On: 09/22/2018 03:58    Medications: Infusions: . cefTRIAXone (ROCEPHIN)   IV Stopped (09/21/18 1545)  . dexmedetomidine (PRECEDEX) IV infusion Stopped (09/22/18 0506)  . dextrose 30 mL/hr at 09/22/18 0700  . fentaNYL infusion INTRAVENOUS 100 mcg/hr (09/22/18 0926)  . norepinephrine (LEVOPHED) Adult infusion Stopped (09/22/18 0016)  . potassium chloride 10 mEq (09/22/18 0904)  .  sodium bicarbonate (isotonic) infusion in sterile water 125 mL/hr at 09/22/18 0800  . vasopressin (PITRESSIN) infusion - *FOR SHOCK* Stopped (09/21/18 1833)    Scheduled Medications: . chlorhexidine gluconate (MEDLINE KIT)  15 mL Mouth Rinse BID  . Chlorhexidine Gluconate Cloth  6 each Topical Daily  . feeding supplement (PRO-STAT SUGAR FREE 64)  30 mL Per Tube BID  . feeding supplement (VITAL HIGH PROTEIN)  1,000 mL Per Tube Q24H  . heparin  5,000 Units Subcutaneous Q8H  . insulin aspart  2-6 Units Subcutaneous Q4H  . insulin detemir  10 Units Subcutaneous Daily  . mouth rinse  15 mL Mouth Rinse 10 times per day  . pantoprazole sodium  40 mg Per Tube Daily  . sodium chloride flush  10-40 mL Intracatheter Q12H    have reviewed scheduled and prn medications.  Physical Exam: General:NAD, comfortable Heart:RRR, s1s2 nl Lungs:clear b/l, no crackle or wheeze Abdomen:soft, Non-tender, non-distended Extremities:No edema   Omar Wood 09/22/2018,9:45 AM  LOS: 5 days

## 2018-09-22 NOTE — Progress Notes (Signed)
Elink notified of patients bradycardia X2, as low as 45. Precedex stopped. Elink also made aware of K 2.3 and Ca 6.3. Awaiting new orders and will continue to monitor.

## 2018-09-22 NOTE — Progress Notes (Signed)
eLink Physician-Brief Progress Note Patient Name: Omar Wood DOB: [birth year] MRN: [id number]   Date of Service  09/22/2018  HPI/Events of Note  1. Low calcium and K 2.3. s/p DKA. On long acting and ssi. 2. Elevated AST/ALT. On Vent. Off of precedex. Discussed with RN. Camera evaluation done. MAP ok. No abdominal pain or distension.  eICU Interventions  1. Kcl 10 meq q1 hr x 3 and follow level 2. Calcium 1 gm IV 3. RUQ abdomen . Consider GI consult in AM.      Intervention Category Intermediate Interventions: Diagnostic test evaluation  Ranee Gosselin 09/22/2018, 6:20 AM

## 2018-09-22 NOTE — Progress Notes (Signed)
Nutrition Follow-up  DOCUMENTATION CODES:   Not applicable  INTERVENTION:   Tube feeding: Vital AF 1.2 @ 60 ml/hr via OGT (1440 ml) 30 ml Prostat BID  Provides: 1928 kcal, 138 g protein, and 1168 ml free water. Meets 104% of calorie needs and 100% of protein needs.   NUTRITION DIAGNOSIS:   Inadequate oral intake related to inability to eat as evidenced by NPO status.  Ongoing  GOAL:   Provide needs based on ASPEN/SCCM guidelines  Addressed via TF  MONITOR:   Labs, TF tolerance, I & O's  REASON FOR ASSESSMENT:   Ventilator    ASSESSMENT:   58 year old male w/ PMH of T2DM, HTN, and bipolar disorder. Presented to [facility] ER after being found unresponsive. Once he arrived to ER he was intubated. Admitted to ICU for DKA.    Pt off TF this am for CT scan. RN to re-start once able. Pt tolerating Vital High Protein at 40 ml/hr + 60 ml Prostat ([facility] protocol). Will change formula to better meet pt's needs.   Pt had four runs of K this am. Will check Mg & phosphorus. Monitor closely.   Weight noted to increase from 79.6 kg on 2/28 to 82.2 kg today. Will utilize EDW of 76.9 kg to estimate needs.   Patient is currently intubated on ventilator support MV: 11.7 L/min Temp (24hrs), Avg:98.3 F (36.8 C), Min:97.9 F (36.6 C), Max:98.8 F (37.1 C) BP: 137/61 MAP: 79  I/O: +11.3 L since admit UOP: 2100 ml x 24 hrs   Medications reviewed and include: SS novolog, Levemir, NS @ 50 ml/hr, D5 in 30 ml/hr, levophed Labs reviewed: K 2.5 (L) corrected calcium 8.5 (L) CBG 67-159 elevated LFTs   Diet Order:   Diet Order            Diet NPO time specified  Diet effective now              EDUCATION NEEDS:   No education needs have been identified at this time  Skin:  Skin Assessment: Reviewed RN Assessment  Last BM:  3/1  Height:   Ht Readings from Last 1 Encounters:  09/17/18 5' 11.65" (1.82 m)    Weight:   Wt Readings from Last 1 Encounters:   09/22/18 82.2 kg    Ideal Body Weight:  78.18 kg  BMI:  Body mass index is 24.82 kg/m.  Estimated Nutritional Needs:   Kcal:  1859 kcal  Protein:  125-140 grams  Fluid:  >/= 1.8 L/day    Vanessa Kick RD, LDN Clinical Nutrition Pager # - [phone]

## 2018-09-22 NOTE — Progress Notes (Signed)
CRITICAL VALUE ALERT  Critical Value: Potassium and Calcium   Date & Time Notied: 09/22/2018 05:20  Provider Notified: Pola Corn RN and bedside RN.   Orders Received/Actions taken: This RN notified bedside RN.

## 2018-09-22 NOTE — Progress Notes (Addendum)
eLink Physician-Brief Progress Note Patient Name: Omar Wood DOB: Oct 21, 1960 MRN: [id number]   Date of Service  09/22/2018  HPI/Events of Note  The patient noted to have K 3 and Ca 6.4.    eICU Interventions  Replete K and Ca.       Intervention Category Intermediate Interventions: Electrolyte abnormality - evaluation and management  Larinda Buttery 09/22/2018, 9:21 PM    K remains at 3 despite KCL IV.   Plan> Give KCl via OGT and KCl IV.

## 2018-09-23 ENCOUNTER — Inpatient Hospital Stay (HOSPITAL_COMMUNITY): Payer: Medicaid Other

## 2018-09-23 LAB — GLUCOSE, CAPILLARY
Glucose-Capillary: 176 mg/dL — ABNORMAL HIGH (ref 70–99)
Glucose-Capillary: 147 mg/dL — ABNORMAL HIGH (ref 70–99)
Glucose-Capillary: 212 mg/dL — ABNORMAL HIGH (ref 70–99)
Glucose-Capillary: 173 mg/dL — ABNORMAL HIGH (ref 70–99)
Glucose-Capillary: 98 mg/dL (ref 70–99)

## 2018-09-23 LAB — COMPREHENSIVE METABOLIC PANEL
ALT: 654 U/L — ABNORMAL HIGH (ref 0–44)
AST: 543 U/L — ABNORMAL HIGH (ref 15–41)
Albumin: 1.3 g/dL — ABNORMAL LOW (ref 3.5–5.0)
Alkaline Phosphatase: 253 U/L — ABNORMAL HIGH (ref 38–126)
Anion gap: 8 (ref 5–15)
BILIRUBIN TOTAL: 0.6 mg/dL (ref 0.3–1.2)
BUN: 83 mg/dL — ABNORMAL HIGH (ref 6–20)
CO2: 32 mmol/L (ref 22–32)
Calcium: 6.6 mg/dL — ABNORMAL LOW (ref 8.9–10.3)
Chloride: 102 mmol/L (ref 98–111)
Creatinine, Ser: 3.38 mg/dL — ABNORMAL HIGH (ref 0.61–1.24)
GFR calc Af Amer: 22 mL/min — ABNORMAL LOW (ref >60)
GFR calc non Af Amer: 19 mL/min — ABNORMAL LOW (ref >60)
Glucose, Bld: 157 mg/dL — ABNORMAL HIGH (ref 70–99)
Potassium: 3 mmol/L — ABNORMAL LOW (ref 3.5–5.1)
Sodium: 142 mmol/L (ref 135–145)
TOTAL PROTEIN: 4.4 g/dL — AB (ref 6.5–8.1)

## 2018-09-23 LAB — CBC
HCT: 29.6 % — ABNORMAL LOW (ref 39.0–52.0)
Hemoglobin: 9.8 g/dL — ABNORMAL LOW (ref 13.0–17.0)
MCH: 28.8 pg (ref 26.0–34.0)
MCHC: 33.1 g/dL (ref 30.0–36.0)
MCV: 87.1 fL (ref 80.0–100.0)
Platelets: 173 10*3/uL (ref 150–400)
RBC: 3.4 MIL/uL — ABNORMAL LOW (ref 4.22–5.81)
RDW: 13.8 % (ref 11.5–15.5)
WBC: 12.5 10*3/uL — ABNORMAL HIGH (ref 4.0–10.5)
nRBC: 0.3 % — ABNORMAL HIGH (ref 0.0–0.2)

## 2018-09-23 LAB — POTASSIUM: Potassium: 3.6 mmol/L (ref 3.5–5.1)

## 2018-09-23 MED ORDER — FENTANYL CITRATE (PF) 100 MCG/2ML IJ SOLN
50.0000 ug | INTRAMUSCULAR | Status: DC | PRN
  Administered 2018-09-24: 100 ug via INTRAVENOUS
  Administered 2018-09-24: 50 ug via INTRAVENOUS
  Filled 2018-09-23 (×2): qty 2

## 2018-09-23 MED ORDER — POTASSIUM CHLORIDE 20 MEQ/15ML (10%) PO SOLN: 40.0000 meq | ORAL | Status: DC

## 2018-09-23 MED ORDER — QUETIAPINE FUMARATE 25 MG PO TABS
100.0000 mg | ORAL_TABLET | Freq: Every day | ORAL | Status: DC
  Administered 2018-09-24 – 2018-10-03 (×6): 100 mg via ORAL
  Filled 2018-09-23 (×2): qty 4
  Filled 2018-09-23 (×2): qty 1
  Filled 2018-09-23 (×4): qty 4

## 2018-09-23 MED ORDER — MAGNESIUM SULFATE 2 GM/50ML IV SOLN
2.0000 g | Freq: Once | INTRAVENOUS | Status: AC
  Administered 2018-09-23: 2 g via INTRAVENOUS
  Filled 2018-09-23: qty 50

## 2018-09-23 MED ORDER — POTASSIUM CHLORIDE 10 MEQ/50ML IV SOLN
10.0000 meq | INTRAVENOUS | Status: AC
  Administered 2018-09-23 (×4): 10 meq via INTRAVENOUS
  Filled 2018-09-23 (×4): qty 50

## 2018-09-23 MED ORDER — POTASSIUM CHLORIDE 20 MEQ/15ML (10%) PO SOLN
40.0000 meq | Freq: Once | ORAL | Status: AC
  Administered 2018-09-23: 40 meq
  Filled 2018-09-23: qty 30

## 2018-09-23 MED ORDER — POTASSIUM CHLORIDE 10 MEQ/100ML IV SOLN
10.0000 meq | INTRAVENOUS | Status: AC
  Administered 2018-09-23 (×2): 10 meq via INTRAVENOUS
  Filled 2018-09-23 (×2): qty 100

## 2018-09-23 MED ORDER — TRAZODONE HCL 50 MG PO TABS
50.0000 mg | ORAL_TABLET | Freq: Every day | ORAL | Status: DC
  Administered 2018-09-24 – 2018-10-03 (×5): 50 mg via ORAL
  Filled 2018-09-23 (×8): qty 1

## 2018-09-23 NOTE — Progress Notes (Signed)
Pt extubated to with RT at bedside. No complications, o2 sats WNL on 5L 02 [place]. Will continue to monitor.

## 2018-09-23 NOTE — Progress Notes (Addendum)
[facility] NEPHROLOGY PROGRESS NOTE  Assessment/ Plan: Pt is a 58 y.o. yo male male with anxiety bipolar, polysubstance abuse, type 2 diabetes, hypertension transferred from [facility] rocking him after found down for unknown time with AMS, DKA and shock.  #Nonoliguric AKI due to ATN in the setting of septic shock and ACE inhibition.  Patient with urine output of 2.9 L in 24 hours with serum creatinine level trending down to 3.3 today.  Monitor urine output, electrolytes and serum creatinine level.  Avoid IV contrast NSAIDs. Renal function is improving.  Continue to monitor.  #Hypokalemia in the setting of recovering AKI with increased urinary loss.  Continue potassium chloride through IV and tube feeding.  Monitor labs.  #Metabolic acidosis improved.  Discontinued sodium bicarb  #Ventilator dependent respiratory failure per PCCM  #Acute metabolic encephalopathy: Following commands  #DKA improved  #Hypotension: Blood pressure is better now. On low dose levo.  Subjective: Seen and examined in ICU.  Low-grade temperature this morning.  No new event. Objective Vital signs in last 24 hours: Vitals:   09/23/18 0600 09/23/18 0615 09/23/18 0752 09/23/18 0800  BP: 102/67   100/70  Pulse: (!) 109 (!) 109 (!) 123 (!) 121  Resp: _0 Temp:    (!) 100.7 F (38.2 C)  TempSrc:    Axillary  SpO2: 100% 100% 98% 100%  Weight:      Height:       Weight change: 2.5 kg  Intake/Output Summary (Last 24 hours) at 09/23/2018 1057 Last data filed at 09/23/2018 0800 Gross per 24 hour  Intake 3914.13 ml  Output 2700 ml  Net 1214.13 ml       Labs: Basic Metabolic Panel: Recent Labs  Lab 09/22/18 0409 09/22/18 0930 09/22/18 1800 09/23/18 0514  NA 141  --  142 142  K 2.3* 2.5* 3.0* 3.0*  CL 99  --  100 102  CO2 30  --  34* 32  GLUCOSE 122*  --  139* 157*  BUN 94*  --  89* 83*  CREATININE 3.95*  --  3.73* 3.38*  CALCIUM 6.3*  --  6.4* 6.6*  PHOS  --   --  1.6*  --     Liver Function Tests: Recent Labs  Lab 09/19/18 0434 09/22/18 0409 09/23/18 0514  AST 77* 781* 543*  ALT 47* 815* 654*  ALKPHOS 86 179* 253*  BILITOT 0.3 0.7 0.6  PROT 4.6* 4.3* 4.4*  ALBUMIN 1.7* 1.2* 1.3*   Recent Labs  Lab 09/18/18 0012  LIPASE 388*   No results for input(s): AMMONIA in the last 168 hours. CBC: Recent Labs  Lab 09/17/18 1640  09/19/18 0434 09/20/18 0250 09/22/18 0409 09/23/18 0514  WBC 11.7*  --  14.6* 13.7* 8.3 12.5*  HGB 12.8*   < > 11.0* 10.7* 9.4* 9.8*  HCT 36.1*   < > 29.8* 30.3* 26.8* 29.6*  MCV 81.7  --  78.8* 81.2 83.2 87.1  PLT 270  --  143* 141* PLATELET CLUMPS NOTED ON SMEAR, COUNT APPEARS DECREASED 173   < > = values in this interval not displayed.   Cardiac Enzymes: Recent Labs  Lab 09/17/18 2015 09/18/18 1141 09/19/18 0434 09/20/18 0250  CKTOTAL 2,482* 2,047* 1,557* 655*   CBG: Recent Labs  Lab 09/22/18 1533 09/22/18 1939 09/22/18 2351 09/23/18 0346 09/23/18 0749  GLUCAP 138* 148* 143* 176* 147*    Iron Studies: No results for input(s): IRON, TIBC, TRANSFERRIN, FERRITIN in the last 72 hours.  Studies/Results: Dg Chest Port 1 View  Result Date: 09/23/2018 CLINICAL DATA:  58 year old male with hepatitis, hyperglycemia. Respiratory failure. EXAM: PORTABLE CHEST 1 VIEW COMPARISON:  09/22/2018 and earlier. FINDINGS: Portable AP semi upright view at 0543 hours. Endotracheal tube tip is stable at the level the clavicles. Enteric tube is stable with side hole at the level of the gastric cardia. Stable left IJ central line. Stable lung volumes and mediastinal contours. Coarse and confluent scattered bilateral pulmonary opacity persists. This is most pronounced in both lower lungs, and some of the left mediastinal and diaphragm contour are obscured as before. Right upper lobe ventilation may be mildly improved since yesterday. No superimposed pneumothorax or large pleural effusion. Paucity bowel gas in the upper abdomen. IMPRESSION:  1. Stable lines and tubes. 2. Continued coarse and confluent bilateral pulmonary opacity. Mild if any improvement in right upper lung ventilation since yesterday. Electronically Signed   By: Genevie Ann M.D.   On: 09/23/2018 06:56   Dg Chest Port 1 View  Result Date: 09/22/2018 CLINICAL DATA:  Respiratory failure with hypoxia EXAM: PORTABLE CHEST 1 VIEW COMPARISON:  09/19/2018, 09/17/2018, 09/05/2018 FINDINGS: Endotracheal tube tip is about 1.5 cm superior to the carina. Esophageal tube tip overlies the proximal stomach. Left-sided central venous catheter tip over the mid right atrium. Worsening consolidation at the left base. Worsening bilateral ground-glass opacity. Probable small left effusion. Stable cardiomediastinal silhouette. Old left clavicle fracture IMPRESSION: 1. Endotracheal tube tip about 1.5 cm superior to carina. Left IJ central venous catheter tip projects over the mid right atrium 2. Interval worsening of bilateral ground-glass opacity with worsened consolidation at the left lung base. Electronically Signed   By: Donavan Foil M.D.   On: 09/22/2018 03:58   US Abdomen Limited Ruq  Result Date: 09/23/2018 CLINICAL DATA:  58 year old male with hepatitis, hyperglycemia. EXAM: ULTRASOUND ABDOMEN LIMITED RIGHT UPPER QUADRANT COMPARISON:  [facility] CT Abdomen and Pelvis 07/06/2018. FINDINGS: Gallbladder: No gallstones or wall thickening visualized. No sonographic Murphy sign noted by sonographer. Common bile duct: Diameter: 6 millimeters, upper limits of normal. Liver: Liver echogenicity appears within normal limits (image 20). No discrete liver lesion. No intrahepatic biliary ductal dilatation. Portal vein is patent on color Doppler imaging with normal direction of blood flow towards the liver. Other findings: Negative visible right kidney. Small right pleural effusion is visible on image 37. No ascites identified. IMPRESSION: 1. Ultrasound appearance of the liver and right upper quadrant  within normal limits. 2. Small right pleural effusion. Electronically Signed   By: Genevie Ann M.D.   On: 09/23/2018 06:32    Medications: Infusions: . sodium chloride 50 mL/hr at 09/22/18 1141  . cefTRIAXone (ROCEPHIN)  IV Stopped (09/22/18 1445)  . feeding supplement (VITAL AF 1.2 CAL) 60 mL/hr at 09/23/18 0535  . fentaNYL infusion INTRAVENOUS 300 mcg/hr (09/23/18 0800)  . magnesium sulfate 1 - 4 g bolus IVPB    . norepinephrine (LEVOPHED) Adult infusion 2 mcg/min (09/23/18 0759)  . potassium chloride    . potassium chloride 10 mEq (09/23/18 0957)  . vasopressin (PITRESSIN) infusion - *FOR SHOCK* Stopped (09/21/18 1833)    Scheduled Medications: . chlorhexidine gluconate (MEDLINE KIT)  15 mL Mouth Rinse BID  . Chlorhexidine Gluconate Cloth  6 each Topical Daily  . feeding supplement (PRO-STAT SUGAR FREE 64)  30 mL Per Tube BID  . heparin  5,000 Units Subcutaneous Q8H  . insulin aspart  2-6 Units Subcutaneous Q4H  . insulin detemir  10 Units Subcutaneous  Daily  . mouth rinse  15 mL Mouth Rinse 10 times per day  . pantoprazole sodium  40 mg Per Tube Daily  . QUEtiapine  100 mg Per Tube QHS  . sodium chloride flush  10-40 mL Intracatheter Q12H  . traZODone  50 mg Per Tube QHS    have reviewed scheduled and prn medications.  Physical Exam: General:NAD, comfortable, intubated Heart:RRR, s1s2 nl Lungs:clear b/l, no crackle or wheeze Abdomen:soft, Non-tender, non-distended Extremities:No edema   Maleeyah Mccaughey Prasad Ahmaya Ostermiller 09/23/2018,10:57 AM  LOS: 6 days

## 2018-09-23 NOTE — Procedures (Signed)
Extubation Procedure Note  Patient Details:   Name: Omar Wood DOB: [birth year] MRN: [id number]   Airway Documentation:    Vent end date: 09/23/18 Vent end time: 1559   Evaluation  O2 sats: stable throughout Complications: No apparent complications Patient did tolerate procedure well. Bilateral Breath Sounds: Diminished   Yes  Peggye Form 09/23/2018, 4:00 PM

## 2018-09-23 NOTE — Progress Notes (Signed)
NAME:  Omar Wood, MRN:  [id number], DOB:  1960/08/08, LOS: 6 ADMISSION DATE:  09/17/2018, CONSULTATION DATE:  09/17/18 REFERRING MD:  Lanier Prude, CHIEF COMPLAINT: AMS  Brief History   58 yo male smoker transferred from [facility] 2/26 with DKA with altered mental status, hypotension, and respiratory failure.  Past Medical History  DM, Bipolar, Anxiety, Polysubstance abuse, HTN, HLD  Significant Hospital Events   2/26  Admit to [facility] from [facility], DKA, hypotensive   Consults:  Nephrology 2/29 >>  Procedures:  ETT 2/26 >>  Lt IJ 2/26 >>  Lt radial A line 2/26 >>  Significant Diagnostic Tests:  R Femoral Arterial Doppler 2/26 >> negative for guide wire CT Head 2/26 >> mild volume loss, no acute findings EEG 2/27 >> negative for seizure  Micro Data:  Urine 2/26 >> negative Sputum 2/26 >> Group B Strep, few Klebsiella Blood 2/26 >> negative  Antimicrobials:  Rocephin 2/26 >>  Zithromax 2/26 >> 2/27 Cefepime 2/28 >> 2/29  Interim history/subjective:  NO acute events overnight. Remains on vent. Remains on pressors, but seem to be sedation related. Precedex had to be DC due to brady, agitation on WUA.   Objective   Blood pressure 100/70, pulse (!) 121, temperature (!) 100.7 F (38.2 C), temperature source Axillary, resp. rate 16, height 5' 11.65" (1.82 m), weight 84.7 kg, SpO2 100 %.    Vent Mode: PRVC FiO2 (%):  [40 %] 40 % Set Rate:  [16 bmp] 16 bmp Vt Set:  [610 mL] 610 mL PEEP:  [5 cmH20] 5 cmH20 Plateau Pressure:  [19 cmH20-24 cmH20] 24 cmH20   Intake/Output Summary (Last 24 hours) at 09/23/2018 0936 Last data filed at 09/23/2018 0800 Gross per 24 hour  Intake 3914.13 ml  Output 2700 ml  Net 1214.13 ml   Filed Weights   09/21/18 0359 09/22/18 0341 09/23/18 0500  Weight: 80.5 kg 82.2 kg 84.7 kg    Examination:  General:  Middle aged male on vent.  Neuro:  Sedated, but arouses to verbal stimuli and follows commands.  HEENT:  Shorter/AT, No JVD  noted, PERRL Cardiovascular:  RRR, no MRG Lungs: bibasilar crackles   Abdomen:  Soft, non-distended, non-tender Musculoskeletal:  No acute deformity or ROM limitation. No edema Skin:  Intact, MMM   I&O 12L positive.    Assessment & Plan:   Acute respiratory failure with hypoxia from PNA with Group B strep and Klebsiella. Plan - full vent support - f/u CXR - day 7 of ABX. Will plan to DC after todays dose - Would like to diurese, however, complicate by renal failure. He is 12L positive for admission. Likely inhibiting weaning/extubation.  - Will try to WUA and SBT today. RN and RT to coordinate.   Septic shock from PNA, now sedation medications seem to be driving force behind hypotension.  Plan - pressors to keep MAP > 65. (levo )  Acute renal failure from ATN. Plan - f/u BMET - Nephrology following.   DM type II. Plan - SSI - hold outpt metformin  Hx of HTN, HLD. Plan - hold outpt lipitor, lisinopril, inderal  Elevated LFTs in setting of shock. Plan - f/u ABd u/s  Acute metabolic encephalopathy. Hx of bipolar disease. Bradycardia with precedex. Plan - RASS goal 0 to -1 - continue seroquel 100 mg qhs and trazodone 50 mg qhs - Consider adding VPA as next line for agitation, although hope for some meaningful weaning today.  - hold outpt cymbalta.  Best practice:  Diet: NPO  Pain/Anxiety/Delirium protocol (if indicated): Fentanyl infusion for RASS 0 to -1.  VAP protocol (if indicated): Ordered  DVT prophylaxis: Heparin  GI prophylaxis: PPI  Glucose control: Sliding scale insulin per protocol Mobility: As tolerated  Code Status: Full Code  Family Communication: No family present currently 3/3 Disposition: ICU   Labs    CMP Latest Ref Rng & Units 09/23/2018 09/22/2018 09/22/2018  Glucose 70 - 99 mg/dL 631(S) 970(Y) -  BUN 6 - 20 mg/dL 63(Z) 85(Y) -  Creatinine 0.61 - 1.24 mg/dL 8.50(Y) 7.74(J) -  Sodium 135 - 145 mmol/L 142 142 -  Potassium 3.5 - 5.1  mmol/L 3.0(L) 3.0(L) 2.5(LL)  Chloride 98 - 111 mmol/L 102 100 -  CO2 22 - 32 mmol/L 32 34(H) -  Calcium 8.9 - 10.3 mg/dL 6.6(L) 6.4(LL) -  Total Protein 6.5 - 8.1 g/dL 2.8(N) - -  Total Bilirubin 0.3 - 1.2 mg/dL 0.6 - -  Alkaline Phos 38 - 126 U/L 253(H) - -  AST 15 - 41 U/L 543(H) - -  ALT 0 - 44 U/L 654(H) - -   CBC Latest Ref Rng & Units 09/23/2018 09/22/2018 09/20/2018  WBC 4.0 - 10.5 K/uL 12.5(H) 8.3 13.7(H)  Hemoglobin 13.0 - 17.0 g/dL 8.6(V) 6.7(M) 10.7(L)  Hematocrit 39.0 - 52.0 % 29.6(L) 26.8(L) 30.3(L)  Platelets 150 - 400 K/uL 173 PLATELET CLUMPS NOTED ON SMEAR, COUNT APPEARS DECREASED 141(L)   CBG (last 3)  Recent Labs    09/22/18 2351 09/23/18 0346 09/23/18 0749  GLUCAP 143* 176* 147*   ABG    Component Value Date/Time   PHART 7.338 (L) 09/17/2018 2315   PCO2ART 28.0 (L) 09/17/2018 2315   PO2ART 80.6 (L) 09/17/2018 2315   HCO3 14.5 (L) 09/17/2018 2315   TCO2 8 (L) 09/17/2018 1750   ACIDBASEDEF 10.0 (H) 09/17/2018 2315   O2SAT 97.1 09/17/2018 2315   CC time 30 mins.    Joneen Roach, AGACNP-BC [facility] Pulmonary/Critical Care Pager [phone] or [phone]  09/23/2018 9:49 AM

## 2018-09-23 NOTE — Progress Notes (Signed)
Reported critical lab values, K+ 3.0 and Ca 6.4. Received orders. Will continue to monitor.

## 2018-09-24 ENCOUNTER — Inpatient Hospital Stay (HOSPITAL_COMMUNITY): Payer: Medicaid Other

## 2018-09-24 LAB — BASIC METABOLIC PANEL
Anion gap: 17 — ABNORMAL HIGH (ref 5–15)
BUN: 56 mg/dL — ABNORMAL HIGH (ref 6–20)
CO2: 27 mmol/L (ref 22–32)
Calcium: 6.7 mg/dL — ABNORMAL LOW (ref 8.9–10.3)
Chloride: 107 mmol/L (ref 98–111)
Creatinine, Ser: 2.52 mg/dL — ABNORMAL HIGH (ref 0.61–1.24)
GFR calc Af Amer: 32 mL/min — ABNORMAL LOW (ref >60)
GFR calc non Af Amer: 27 mL/min — ABNORMAL LOW (ref >60)
Glucose, Bld: 268 mg/dL — ABNORMAL HIGH (ref 70–99)
Potassium: 4.4 mmol/L (ref 3.5–5.1)
Sodium: 151 mmol/L — ABNORMAL HIGH (ref 135–145)

## 2018-09-24 LAB — COMPREHENSIVE METABOLIC PANEL
ALT: 509 U/L — ABNORMAL HIGH (ref 0–44)
ANION GAP: 9 (ref 5–15)
AST: 397 U/L — ABNORMAL HIGH (ref 15–41)
Albumin: 1.3 g/dL — ABNORMAL LOW (ref 3.5–5.0)
Alkaline Phosphatase: 275 U/L — ABNORMAL HIGH (ref 38–126)
BUN: 63 mg/dL — ABNORMAL HIGH (ref 6–20)
CO2: 33 mmol/L — ABNORMAL HIGH (ref 22–32)
Calcium: 6.8 mg/dL — ABNORMAL LOW (ref 8.9–10.3)
Chloride: 108 mmol/L (ref 98–111)
Creatinine, Ser: 2.55 mg/dL — ABNORMAL HIGH (ref 0.61–1.24)
GFR calc Af Amer: 31 mL/min — ABNORMAL LOW (ref >60)
GFR calc non Af Amer: 27 mL/min — ABNORMAL LOW (ref >60)
GLUCOSE: 136 mg/dL — AB (ref 70–99)
Potassium: 3.4 mmol/L — ABNORMAL LOW (ref 3.5–5.1)
Sodium: 150 mmol/L — ABNORMAL HIGH (ref 135–145)
Total Bilirubin: 0.8 mg/dL (ref 0.3–1.2)
Total Protein: 4.6 g/dL — ABNORMAL LOW (ref 6.5–8.1)

## 2018-09-24 LAB — CBC
HCT: 29.1 % — ABNORMAL LOW (ref 39.0–52.0)
Hemoglobin: 9.5 g/dL — ABNORMAL LOW (ref 13.0–17.0)
MCH: 29.6 pg (ref 26.0–34.0)
MCHC: 32.6 g/dL (ref 30.0–36.0)
MCV: 90.7 fL (ref 80.0–100.0)
Platelets: 233 10*3/uL (ref 150–400)
RBC: 3.21 MIL/uL — ABNORMAL LOW (ref 4.22–5.81)
RDW: 14.2 % (ref 11.5–15.5)
WBC: 14.8 10*3/uL — ABNORMAL HIGH (ref 4.0–10.5)
nRBC: 0.1 % (ref 0.0–0.2)

## 2018-09-24 LAB — NA AND K (SODIUM & POTASSIUM), RAND UR
Potassium Urine: 22 mmol/L
SODIUM UR: 73 mmol/L

## 2018-09-24 LAB — GLUCOSE, CAPILLARY
GLUCOSE-CAPILLARY: 95 mg/dL (ref 70–99)
Glucose-Capillary: 119 mg/dL — ABNORMAL HIGH (ref 70–99)
Glucose-Capillary: 158 mg/dL — ABNORMAL HIGH (ref 70–99)
Glucose-Capillary: 171 mg/dL — ABNORMAL HIGH (ref 70–99)
Glucose-Capillary: 199 mg/dL — ABNORMAL HIGH (ref 70–99)
Glucose-Capillary: 206 mg/dL — ABNORMAL HIGH (ref 70–99)
Glucose-Capillary: 234 mg/dL — ABNORMAL HIGH (ref 70–99)

## 2018-09-24 LAB — PHOSPHORUS: PHOSPHORUS: 2.5 mg/dL (ref 2.5–4.6)

## 2018-09-24 LAB — MAGNESIUM: Magnesium: 2 mg/dL (ref 1.7–2.4)

## 2018-09-24 LAB — OSMOLALITY, URINE: Osmolality, Ur: 429 mOsm/kg (ref 300–900)

## 2018-09-24 MED ORDER — DEXTROSE 5 % IV SOLN: INTRAVENOUS | Status: DC

## 2018-09-24 MED ORDER — DULOXETINE HCL 60 MG PO CPEP
60.0000 mg | ORAL_CAPSULE | Freq: Two times a day (BID) | ORAL | Status: DC
  Administered 2018-09-24 – 2018-10-04 (×11): 60 mg via ORAL
  Filled 2018-09-24 (×15): qty 1

## 2018-09-24 MED ORDER — STERILE WATER FOR INJECTION IV SOLN
INTRAVENOUS | Status: DC
  Filled 2018-09-24: qty 4.81

## 2018-09-24 MED ORDER — ORAL CARE MOUTH RINSE
15.0000 mL | Freq: Two times a day (BID) | OROMUCOSAL | Status: DC
  Administered 2018-09-25 – 2018-09-30 (×7): 15 mL via OROMUCOSAL

## 2018-09-24 MED ORDER — DEXTROSE 5 % IV SOLN
INTRAVENOUS | Status: AC
  Administered 2018-09-24: 13:00:00 via INTRAVENOUS

## 2018-09-24 MED ORDER — PNEUMOCOCCAL VAC POLYVALENT 25 MCG/0.5ML IJ INJ
0.5000 mL | INJECTION | INTRAMUSCULAR | Status: AC
  Administered 2018-09-25: 0.5 mL via INTRAMUSCULAR
  Filled 2018-09-24: qty 0.5

## 2018-09-24 MED ORDER — POTASSIUM CHLORIDE 10 MEQ/50ML IV SOLN
10.0000 meq | INTRAVENOUS | Status: AC
  Administered 2018-09-24 (×4): 10 meq via INTRAVENOUS
  Filled 2018-09-24 (×2): qty 50

## 2018-09-24 NOTE — Evaluation (Signed)
Clinical/Bedside Swallow Evaluation Patient Details  Name: Omar Wood MRN: [id number] Date of Birth: October 27, 1960  Today's Date: 09/24/2018 Time: SLP Start Time (ACUTE ONLY): 1033 SLP Stop Time (ACUTE ONLY): 1100 SLP Time Calculation (min) (ACUTE ONLY): 27 min  Past Medical History:  Past Medical History:  Diagnosis Date  . Bipolar disorder (HCC)   . Diabetes mellitus without complication (HCC)   . Hypercholesteremia   . Hypertension   . Marijuana user   . Scrotal injury   . Tobacco abuse    Past Surgical History:  Past Surgical History:  Procedure Laterality Date  . APPENDECTOMY     HPI:  Pt is a 58 yo male smoker transferred from [facility] 2/26 with PMH of DM, Bipolar, Anxiety, Polysubstance abuse, HTN and HLD presenting with DKA with altered mental status, hypotension, and respiratory failure. ETT:  2/26-3/3. Reported by MD to be coughing on ice chips post extubation.   Assessment / Plan / Recommendation Clinical Impression  Pt seen for bedside swallow evaluation post extubation. He displayed hoarse, breathy and low intensity vocal quality secondary to prolonged intubation. Oral examination revealed short lingual frenum and poor dental condition, however these findings were not observed to impact swallow function but is still probable. Immediate weak coughing of thin liquids increased with bolus size and exacerbated even more with intake through straw. Spoonfuls of puree tolerated with no observable signs of dysphagia. Mastication and bolus formation appeared appropriate considering missing dentition and short lingual frenum, however consumption of regular solid was followed by immediate coughing. Given prolonged intubation and dysphonic vocal quality pt is at increased risk for aspiration. Recommend NPO with meds crushed in puree and MBS to confirm swallow function and safe diet advancment.   SLP Visit Diagnosis: Dysphagia, unspecified (R13.10)    Aspiration Risk  Severe  aspiration risk    Diet Recommendation NPO except meds   Medication Administration: Crushed with puree Supervision: Staff to assist with self feeding;Full supervision/cueing for compensatory strategies Compensations: Slow rate;Small sips/bites Postural Changes: Seated upright at 90 degrees;Remain upright for at least 30 minutes after po intake    Other  Recommendations Oral Care Recommendations: Oral care QID   Follow up Recommendations Other (comment)(TBD)      Frequency and Duration min 2x/week          Prognosis Prognosis for Safe Diet Advancement: Good      Swallow Study   General HPI: Pt is a 58 yo male smoker transferred from [facility] 2/26 with PMH of DM, Bipolar, Anxiety, Polysubstance abuse, HTN and HLD presenting with DKA with altered mental status, hypotension, and respiratory failure. ETT:  2/26-3/3. Reported by MD to be coughing on ice chips post extubation. Type of Study: Bedside Swallow Evaluation Diet Prior to this Study: NPO Temperature Spikes Noted: No Respiratory Status: Nasal cannula History of Recent Intubation: Yes Length of Intubations (days): 7 days Date extubated: 09/23/18 Behavior/Cognition: Alert;Cooperative Oral Cavity Assessment: Within Functional Limits; short lingual frenum Oral Care Completed by SLP: Yes Oral Cavity - Dentition: Missing dentition;Poor condition Vision: Functional for self-feeding Self-Feeding Abilities: Total assist Patient Positioning: Upright in bed Baseline Vocal Quality: Breathy;Hoarse;Low vocal intensity Volitional Cough: Weak Volitional Swallow: Able to elicit    Oral/Motor/Sensory Function Overall Oral Motor/Sensory Function: Within functional limits   Ice Chips Ice chips: Within functional limits Presentation: Spoon   Thin Liquid Thin Liquid: Impaired Presentation: Cup;Spoon;Straw Pharyngeal  Phase Impairments: Cough - Immediate    Nectar Thick Nectar Thick Liquid: Not tested  Honey Thick Honey Thick  Liquid: Not tested   Puree Puree: Within functional limits Presentation: Spoon   Solid     Solid: Impaired Presentation: Self Fed Pharyngeal Phase Impairments: Cough - Immediate      Gardiner Ramus, Student SLP 09/24/2018,11:15 AM

## 2018-09-24 NOTE — Progress Notes (Signed)
[facility] PROGRESS NOTE  Assessment/ Plan: Pt is a 58 y.o. yo male male with anxiety bipolar, polysubstance abuse, type 2 diabetes, hypertension transferred from [facility] after found down for unknown time with AMS, DKA and shock.  #Nonoliguric AKI due to ATN in the setting of septic shock and ACE inhibition.  Patient with urine output of 4.8 L in 24 hours with serum creatinine level trending down to 2.5 today.  Monitor urine output, electrolytes and serum creatinine level.  Avoid IV contrast NSAIDs. Renal function is improving.  Continue to monitor.  # Hypernatremia: He has post ATN diuresis and decreased oral intake.  He has free water deficit.  Unable to use D5 because of diabetes.  He is n.p.o. and has no tube for free water intake.  I am starting hypotonic saline.  Monitor labs. -Check urine lites and osmolality  #Hypokalemia in the setting of recovering AKI with increased urinary loss.  Continue potassium chloride through IV.  Monitor labs.  #Metabolic acidosis improved.  Discontinued sodium bicarb  #Ventilator dependent respiratory failure per PCCM patient is extubated  #Acute metabolic encephalopathy: Following commands, mental status improving  #DKA improved  #Hypotension: Blood pressure is better now. On low dose levo.  Subjective: Seen and examined in ICU.  He is extubated, alert awake.  Has a dry mucous membrane and wants to eat something.  Objective Vital signs in last 24 hours: Vitals:   09/24/18 0700 09/24/18 0736 09/24/18 0800 09/24/18 0900  BP: 139/73  95/72 124/66  Pulse: (!) 110  (!) 109 (!) 106  Resp: (!) 37  (!) 23 (!) 27  Temp:  98.2 F (36.8 C)    TempSrc:  Oral    SpO2: 100%  95% 100%  Weight:      Height:       Weight change: 0.1 kg  Intake/Output Summary (Last 24 hours) at 09/24/2018 1029 Last data filed at 09/24/2018 0900 Gross per 24 hour  Intake 1394.19 ml  Output 5270 ml  Net -3875.81 ml       Labs: Basic Metabolic  Panel: Recent Labs  Lab 09/22/18 1800 09/23/18 0514 09/23/18 1941 09/24/18 0306  NA 142 142  --  150*  K 3.0* 3.0* 3.6 3.4*  CL 100 102  --  108  CO2 34* 32  --  33*  GLUCOSE 139* 157*  --  136*  BUN 89* 83*  --  63*  CREATININE 3.73* 3.38*  --  2.55*  CALCIUM 6.4* 6.6*  --  6.8*  PHOS 1.6*  --   --  2.5   Liver Function Tests: Recent Labs  Lab 09/22/18 0409 09/23/18 0514 09/24/18 0306  AST 781* 543* 397*  ALT 815* 654* 509*  ALKPHOS 179* 253* 275*  BILITOT 0.7 0.6 0.8  PROT 4.3* 4.4* 4.6*  ALBUMIN 1.2* 1.3* 1.3*   Recent Labs  Lab 09/18/18 0012  LIPASE 388*   No results for input(s): AMMONIA in the last 168 hours. CBC: Recent Labs  Lab 09/19/18 0434 09/20/18 0250 09/22/18 0409 09/23/18 0514 09/24/18 0306  WBC 14.6* 13.7* 8.3 12.5* 14.8*  HGB 11.0* 10.7* 9.4* 9.8* 9.5*  HCT 29.8* 30.3* 26.8* 29.6* 29.1*  MCV 78.8* 81.2 83.2 87.1 90.7  PLT 143* 141* PLATELET CLUMPS NOTED ON SMEAR, COUNT APPEARS DECREASED 173 233   Cardiac Enzymes: Recent Labs  Lab 09/17/18 2015 09/18/18 1141 09/19/18 0434 09/20/18 0250  CKTOTAL 2,482* 2,047* 1,557* 655*   CBG: Recent Labs  Lab 09/23/18 1606 09/23/18  2000 09/24/18 0005 09/24/18 0343 09/24/18 0741  GLUCAP 173* 98 95 119* 158*    Iron Studies: No results for input(s): IRON, TIBC, TRANSFERRIN, FERRITIN in the last 72 hours. Studies/Results: Dg Chest Port 1 View  Result Date: 09/24/2018 CLINICAL DATA:  Community-acquired pneumonia EXAM: PORTABLE CHEST 1 VIEW COMPARISON:  09/23/2018 FINDINGS: Cardiac shadow is stable. Left jugular central line is again noted in satisfactory position. The endotracheal tube and nasogastric catheter have been removed in the interval. Increasing patchy infiltrates are noted bilaterally as well as central vascular congestion and edema. IMPRESSION: Increasing patchy infiltrates bilaterally. Central vascular congestion and interstitial edema. Electronically Signed   By: Inez Catalina M.D.    On: 09/24/2018 07:16   Dg Chest Port 1 View  Result Date: 09/23/2018 CLINICAL DATA:  58 year old male with hepatitis, hyperglycemia. Respiratory failure. EXAM: PORTABLE CHEST 1 VIEW COMPARISON:  09/22/2018 and earlier. FINDINGS: Portable AP semi upright view at 0543 hours. Endotracheal tube tip is stable at the level the clavicles. Enteric tube is stable with side hole at the level of the gastric cardia. Stable left IJ central line. Stable lung volumes and mediastinal contours. Coarse and confluent scattered bilateral pulmonary opacity persists. This is most pronounced in both lower lungs, and some of the left mediastinal and diaphragm contour are obscured as before. Right upper lobe ventilation may be mildly improved since yesterday. No superimposed pneumothorax or large pleural effusion. Paucity bowel gas in the upper abdomen. IMPRESSION: 1. Stable lines and tubes. 2. Continued coarse and confluent bilateral pulmonary opacity. Mild if any improvement in right upper lung ventilation since yesterday. Electronically Signed   By: Genevie Ann M.D.   On: 09/23/2018 06:56   US Abdomen Limited Ruq  Result Date: 09/23/2018 CLINICAL DATA:  58 year old male with hepatitis, hyperglycemia. EXAM: ULTRASOUND ABDOMEN LIMITED RIGHT UPPER QUADRANT COMPARISON:  [facility] CT Abdomen and Pelvis 07/06/2018. FINDINGS: Gallbladder: No gallstones or wall thickening visualized. No sonographic Murphy sign noted by sonographer. Common bile duct: Diameter: 6 millimeters, upper limits of normal. Liver: Liver echogenicity appears within normal limits (image 20). No discrete liver lesion. No intrahepatic biliary ductal dilatation. Portal vein is patent on color Doppler imaging with normal direction of blood flow towards the liver. Other findings: Negative visible right kidney. Small right pleural effusion is visible on image 37. No ascites identified. IMPRESSION: 1. Ultrasound appearance of the liver and right upper quadrant  within normal limits. 2. Small right pleural effusion. Electronically Signed   By: Genevie Ann M.D.   On: 09/23/2018 06:32    Medications: Infusions:   Scheduled Medications: . chlorhexidine gluconate (MEDLINE KIT)  15 mL Mouth Rinse BID  . Chlorhexidine Gluconate Cloth  6 each Topical Daily  . heparin  5,000 Units Subcutaneous Q8H  . insulin aspart  2-6 Units Subcutaneous Q4H  . insulin detemir  10 Units Subcutaneous Daily  . mouth rinse  15 mL Mouth Rinse 10 times per day  . pantoprazole sodium  40 mg Per Tube Daily  . [START ON 09/25/2018] pneumococcal 23 valent vaccine  0.5 mL Intramuscular Tomorrow-1000  . QUEtiapine  100 mg Oral QHS  . sodium chloride flush  10-40 mL Intracatheter Q12H  . traZODone  50 mg Oral QHS    have reviewed scheduled and prn medications.  Physical Exam: General:NAD, comfortable, extubated, dry crusty mucous membrane Heart:RRR, s1s2 nl, no rubs Lungs:clear b/l, no crackle or wheeze Abdomen:soft, Non-tender, non-distended Extremities: Trace edema Neurology: Alert awake and following commands  Ranveer Wahlstrom Reesa Chew  Denyla Cortese 09/24/2018,10:29 AM  LOS: 7 days

## 2018-09-24 NOTE — Progress Notes (Signed)
NAME:  Omar Wood, MRN:  [id number], DOB:  10-06-1960, LOS: 7 ADMISSION DATE:  09/17/2018, CONSULTATION DATE:  09/17/18 REFERRING MD:  Lanier Prude, CHIEF COMPLAINT: AMS  Brief History   58 yo male smoker transferred from [facility] 2/26 with DKA with altered mental status, hypotension, and respiratory failure.  Past Medical History  DM, Bipolar, Anxiety, Polysubstance abuse, HTN, HLD  Significant Hospital Events   2/26  Admit to [facility] from [facility], DKA, hypotensive   Consults:  Nephrology 2/29 >>  Procedures:  ETT 2/26 >>  Lt IJ 2/26 >>  Lt radial A line 2/26 >>  Significant Diagnostic Tests:  R Femoral Arterial Doppler 2/26 >> negative for guide wire CT Head 2/26 >> mild volume loss, no acute findings EEG 2/27 >> negative for seizure  Micro Data:  Urine 2/26 >> negative Sputum 2/26 >> Group B Strep, few Klebsiella Blood 2/26 >> negative  Antimicrobials:  Rocephin 2/26 >> 3/3 Zithromax 2/26 >> 2/27 Cefepime 2/28 >> 2/29  Interim history/subjective:  Na increased to 150 this morning, nephrology ordered hypotonic saline IVF   Objective   Blood pressure 124/66, pulse (!) 106, temperature 98.2 F (36.8 C), temperature source Oral, resp. rate (!) 27, height 5' 11.65" (1.82 m), weight 84.8 kg, SpO2 100 %.    Vent Mode: CPAP;PSV FiO2 (%):  [40 %] 40 % Set Rate:  [16 bmp] 16 bmp Vt Set:  [610 mL] 610 mL PEEP:  [5 cmH20] 5 cmH20 Pressure Support:  [5 cmH20-8 cmH20] 5 cmH20 Plateau Pressure:  [20 cmH20-21 cmH20] 20 cmH20   Intake/Output Summary (Last 24 hours) at 09/24/2018 1036 Last data filed at 09/24/2018 0900 Gross per 24 hour  Intake 1394.19 ml  Output 5270 ml  Net -3875.81 ml   Filed Weights   09/22/18 0341 09/23/18 0500 09/24/18 0311  Weight: 82.2 kg 84.7 kg 84.8 kg    Examination:    General:  Chronically ill appearing adult male, reclined in bed, NAD  Neuro:  AAOx3, following commands  HEENT:  NCAT, pink mmm, trachea midline    Cardiovascular: RRR s1s2 no r/g/m  Lungs: diminished bibasilar breath sounds. No accessory muscle recruitment Abdomen:  Soft, round, NDNT, bowel sounds x4  Musculoskeletal: symmetrical bulk and tone, no obvious joint deformity  Skin: clean, dry, warm, intact    Assessment & Plan:   Septic shock from PNA, now sedation medications seem to be driving force behind hypotension.  -s/p 7 day course of abx for group B strep, klebsiella  Plan -MAP goal > 65, achieving without pressors  -trend WBC, temperature   Acute renal failure from ATN -improving Cr  -non-oliguric -Hypernatremia post ATN diuresis Plan - Nephrology following, appreciate recs - 3/4 Na+ 150; nephrology changed mIVF to 0.225% saline  -Follow up BMP. Anticipate will be able to decrease IVF.  -Has free water deficit, did not pass swallow study so unable to address FW via PO intake at this time. SLP plan as below -continue to avoid NSAIDs, nephrotoxic agents -Check Urine Osm, Urine Na per nephrology   DM type II. Plan - SSI, levemir  - hold outpt metformin  Hx of HTN, HLD. Plan - hold outpt lipitor, lisinopril, inderal  Elevated LFTs in setting of shock. Plan - f/u ABd u/s  Hx of bipolar disease. Bradycardia with precedex. Plan - continue seroquel 100 mg qhs and trazodone 50 mg qhs - restart home cymbalta (60 mg BID)  - trazodone qHS  -Delirium precautions   Deconditioning -SLP  eval -PT/OT   Best practice:  Diet: NPO  Pain/Anxiety/Delirium protocol (if indicated):PRN fentanyl  VAP protocol (if indicated): n/a DVT prophylaxis: Heparin  GI prophylaxis: PPI  Glucose control: levemir, SSI  Mobility: As tolerated  Code Status: Full Code  Family Communication: none at bedside  Disposition: Currently ICU, can likely go to progressive   Labs    CMP Latest Ref Rng & Units 09/24/2018 09/23/2018 09/23/2018  Glucose 70 - 99 mg/dL 161(W) - 960(A)  BUN 6 - 20 mg/dL 54(U) - 98(J)  Creatinine 0.61 - 1.24 mg/dL  1.91(Y) - 7.82(N)  Sodium 135 - 145 mmol/L 150(H) - 142  Potassium 3.5 - 5.1 mmol/L 3.4(L) 3.6 3.0(L)  Chloride 98 - 111 mmol/L 108 - 102  CO2 22 - 32 mmol/L 33(H) - 32  Calcium 8.9 - 10.3 mg/dL 5.6(O) - 6.6(L)  Total Protein 6.5 - 8.1 g/dL 4.6(L) - 4.4(L)  Total Bilirubin 0.3 - 1.2 mg/dL 0.8 - 0.6  Alkaline Phos 38 - 126 U/L 275(H) - 253(H)  AST 15 - 41 U/L 397(H) - 543(H)  ALT 0 - 44 U/L 509(H) - 654(H)   CBC Latest Ref Rng & Units 09/24/2018 09/23/2018 09/22/2018  WBC 4.0 - 10.5 K/uL 14.8(H) 12.5(H) 8.3  Hemoglobin 13.0 - 17.0 g/dL 1.3(Y) 8.6(V) 7.8(I)  Hematocrit 39.0 - 52.0 % 29.1(L) 29.6(L) 26.8(L)  Platelets 150 - 400 K/uL 233 173 PLATELET CLUMPS NOTED ON SMEAR, COUNT APPEARS DECREASED   CBG (last 3)  Recent Labs    09/24/18 0005 09/24/18 0343 09/24/18 0741  GLUCAP 95 119* 158*   ABG    Component Value Date/Time   PHART 7.338 (L) 09/17/2018 2315   PCO2ART 28.0 (L) 09/17/2018 2315   PO2ART 80.6 (L) 09/17/2018 2315   HCO3 14.5 (L) 09/17/2018 2315   TCO2 8 (L) 09/17/2018 1750   ACIDBASEDEF 10.0 (H) 09/17/2018 2315   O2SAT 97.1 09/17/2018 2315    Tessie Fass MSN, AGACNP-BC [facility] 09/24/2018, 11:02 AM [phone]

## 2018-09-25 ENCOUNTER — Inpatient Hospital Stay (HOSPITAL_COMMUNITY): Payer: Medicaid Other

## 2018-09-25 DIAGNOSIS — E870 Hyperosmolality and hypernatremia: Secondary | ICD-10-CM

## 2018-09-25 LAB — CBC
HCT: 29.8 % — ABNORMAL LOW (ref 39.0–52.0)
Hemoglobin: 9.5 g/dL — ABNORMAL LOW (ref 13.0–17.0)
MCH: 29.6 pg (ref 26.0–34.0)
MCHC: 31.9 g/dL (ref 30.0–36.0)
MCV: 92.8 fL (ref 80.0–100.0)
Platelets: 377 10*3/uL (ref 150–400)
RBC: 3.21 MIL/uL — ABNORMAL LOW (ref 4.22–5.81)
RDW: 14.4 % (ref 11.5–15.5)
WBC: 15.3 10*3/uL — AB (ref 4.0–10.5)
nRBC: 0 % (ref 0.0–0.2)

## 2018-09-25 LAB — BASIC METABOLIC PANEL
Anion gap: 12 (ref 5–15)
BUN: 55 mg/dL — ABNORMAL HIGH (ref 6–20)
CO2: 29 mmol/L (ref 22–32)
Calcium: 7 mg/dL — ABNORMAL LOW (ref 8.9–10.3)
Chloride: 111 mmol/L (ref 98–111)
Creatinine, Ser: 2.19 mg/dL — ABNORMAL HIGH (ref 0.61–1.24)
GFR calc Af Amer: 37 mL/min — ABNORMAL LOW (ref >60)
GFR calc non Af Amer: 32 mL/min — ABNORMAL LOW (ref >60)
Glucose, Bld: 259 mg/dL — ABNORMAL HIGH (ref 70–99)
POTASSIUM: 3.4 mmol/L — AB (ref 3.5–5.1)
Sodium: 152 mmol/L — ABNORMAL HIGH (ref 135–145)

## 2018-09-25 LAB — HEPATIC FUNCTION PANEL
ALBUMIN: 1.4 g/dL — AB (ref 3.5–5.0)
ALT: 314 U/L — ABNORMAL HIGH (ref 0–44)
AST: 136 U/L — ABNORMAL HIGH (ref 15–41)
Alkaline Phosphatase: 220 U/L — ABNORMAL HIGH (ref 38–126)
Bilirubin, Direct: 0.1 mg/dL (ref 0.0–0.2)
Indirect Bilirubin: 1 mg/dL — ABNORMAL HIGH (ref 0.3–0.9)
Total Bilirubin: 1.1 mg/dL (ref 0.3–1.2)
Total Protein: 4.7 g/dL — ABNORMAL LOW (ref 6.5–8.1)

## 2018-09-25 LAB — CK: Total CK: 60 U/L (ref 49–397)

## 2018-09-25 LAB — GLUCOSE, CAPILLARY
GLUCOSE-CAPILLARY: 214 mg/dL — AB (ref 70–99)
Glucose-Capillary: 242 mg/dL — ABNORMAL HIGH (ref 70–99)
Glucose-Capillary: 281 mg/dL — ABNORMAL HIGH (ref 70–99)
Glucose-Capillary: 325 mg/dL — ABNORMAL HIGH (ref 70–99)
Glucose-Capillary: 278 mg/dL — ABNORMAL HIGH (ref 70–99)
Glucose-Capillary: 292 mg/dL — ABNORMAL HIGH (ref 70–99)

## 2018-09-25 MED ORDER — ENSURE ENLIVE PO LIQD
237.0000 mL | Freq: Three times a day (TID) | ORAL | Status: DC
  Administered 2018-09-25 – 2018-09-29 (×7): 237 mL via ORAL

## 2018-09-25 MED ORDER — INSULIN ASPART 100 UNIT/ML ~~LOC~~ SOLN
0.0000 [IU] | Freq: Every day | SUBCUTANEOUS | Status: DC
  Administered 2018-09-25: 3 [IU] via SUBCUTANEOUS
  Administered 2018-10-02: 2 [IU] via SUBCUTANEOUS

## 2018-09-25 MED ORDER — DEXTROSE 5 % IV SOLN
INTRAVENOUS | Status: DC
  Administered 2018-09-25: 09:00:00 via INTRAVENOUS

## 2018-09-25 MED ORDER — PANTOPRAZOLE SODIUM 40 MG PO PACK
40.0000 mg | PACK | Freq: Every day | ORAL | Status: DC
  Administered 2018-09-27: 40 mg via ORAL
  Filled 2018-09-25 (×2): qty 20

## 2018-09-25 MED ORDER — POTASSIUM CHLORIDE 10 MEQ/100ML IV SOLN
10.0000 meq | INTRAVENOUS | Status: AC
  Administered 2018-09-25 (×3): 10 meq via INTRAVENOUS
  Filled 2018-09-25 (×3): qty 100

## 2018-09-25 MED ORDER — INSULIN DETEMIR 100 UNIT/ML ~~LOC~~ SOLN
15.0000 [IU] | Freq: Every day | SUBCUTANEOUS | Status: DC
  Administered 2018-09-25: 15 [IU] via SUBCUTANEOUS
  Filled 2018-09-25 (×2): qty 0.15

## 2018-09-25 MED ORDER — INSULIN ASPART 100 UNIT/ML ~~LOC~~ SOLN
0.0000 [IU] | Freq: Three times a day (TID) | SUBCUTANEOUS | Status: DC
  Administered 2018-09-25 – 2018-09-26 (×2): 11 [IU] via SUBCUTANEOUS
  Administered 2018-09-26: 8 [IU] via SUBCUTANEOUS
  Administered 2018-09-27 (×2): 5 [IU] via SUBCUTANEOUS
  Administered 2018-09-27: 3 [IU] via SUBCUTANEOUS
  Administered 2018-09-28: 2 [IU] via SUBCUTANEOUS
  Administered 2018-09-28: 5 [IU] via SUBCUTANEOUS
  Administered 2018-09-29: 11 [IU] via SUBCUTANEOUS
  Administered 2018-09-29: 8 [IU] via SUBCUTANEOUS
  Administered 2018-09-30: 3 [IU] via SUBCUTANEOUS
  Administered 2018-09-30: 11 [IU] via SUBCUTANEOUS
  Administered 2018-10-01 – 2018-10-02 (×3): 5 [IU] via SUBCUTANEOUS
  Administered 2018-10-02: 15 [IU] via SUBCUTANEOUS
  Administered 2018-10-03: 20 [IU] via SUBCUTANEOUS
  Administered 2018-10-03: 11 [IU] via SUBCUTANEOUS
  Administered 2018-10-04: 3 [IU] via SUBCUTANEOUS
  Administered 2018-10-04: 11 [IU] via SUBCUTANEOUS

## 2018-09-25 MED ORDER — POTASSIUM CHLORIDE 10 MEQ/50ML IV SOLN
10.0000 meq | INTRAVENOUS | Status: AC
  Administered 2018-09-25 (×3): 10 meq via INTRAVENOUS
  Filled 2018-09-25 (×3): qty 50

## 2018-09-25 MED ORDER — MAGIC MOUTHWASH
10.0000 mL | Freq: Four times a day (QID) | ORAL | Status: DC | PRN
  Filled 2018-09-25: qty 10

## 2018-09-25 MED ORDER — FREE WATER
100.0000 mL | Status: DC
  Administered 2018-09-25 – 2018-09-29 (×19): 100 mL via ORAL

## 2018-09-25 MED ORDER — INSULIN ASPART 100 UNIT/ML ~~LOC~~ SOLN
0.0000 [IU] | SUBCUTANEOUS | Status: DC
  Administered 2018-09-25: 3 [IU] via SUBCUTANEOUS
  Administered 2018-09-25: 5 [IU] via SUBCUTANEOUS

## 2018-09-25 MED ORDER — NYSTATIN 100000 UNIT/GM EX CREA
TOPICAL_CREAM | Freq: Two times a day (BID) | CUTANEOUS | Status: DC
  Administered 2018-09-25 – 2018-09-28 (×8): via TOPICAL
  Filled 2018-09-25 (×2): qty 15

## 2018-09-25 NOTE — Progress Notes (Signed)
Inpatient Diabetes Program Recommendations  AACE/ADA: New Consensus Statement on Inpatient Glycemic Control  Target Ranges:  Prepandial:   less than 140 mg/dL      Peak postprandial:   less than 180 mg/dL (1-2 hours)      Critically ill patients:  140 - 180 mg/dL   Results for Omar Wood, Omar Wood (MRN [id number]) as of 09/25/2018 10:14  Ref. Range 09/24/2018 07:41 09/24/2018 11:31 09/24/2018 15:42 09/24/2018 19:52 09/24/2018 23:41 09/25/2018 03:25 09/25/2018 08:17  Glucose-Capillary Latest Ref Range: 70 - 99 mg/dL 128 (H) 786 (H) 767 (H) 171 (H) 206 (H) 214 (H) 242 (H)   Review of Glycemic Control  Current orders for Inpatient glycemic control: Levemir 15 units daily, Novolog 0-9 units Q4H; D5@75  ml/hr  Inpatient Diabetes Program Recommendations:   Insulin - Basal: Noted Levemir was held yesterday (per MD order) and glucose more elevated over past 24 hours as a result. Ordered Levemir 15 units daily and patient should receive today.  Thanks, Orlando Penner, RN, MSN, CDE Diabetes Coordinator Inpatient Diabetes Program [phone] (Team Pager from 8am to 5pm)

## 2018-09-25 NOTE — Evaluation (Addendum)
Physical Therapy Evaluation Patient Details Name: Omar Wood MRN: [id number] DOB: 1960-09-16 Today's Date: 09/25/2018   History of Present Illness  Patient is a 58 y/o male who presented to [facility] ED on 2/26 unresponsive. Intubated 2/26-3/3. Pt was admitted for AMS, DKA, hypotension, septic shock with PNA, acute respiratory failure and acute metabolic encephalopathy. PMH includes HTN, DM, bipolar, tobacco abuse, polysubstance abuse.  Clinical Impression  Patient presents with generalized weakness, deconditioning, dizziness, impaired balance, impulsivity and impaired mobility s/p above. Also noted to have some cognitive deficits- impaired memory and awareness of deficits and safety. Pt tolerated sitting EOB for ~15 mins with close min guard for safety as pt impulsive and dizzy. Able to stand and side step along side bed with Mod A for balance/safety. HR ranged from 100-130s bpm, Sp02 dropped to 86% on 3.5L/min 02. Supine BP 142/72, Sitting BP 121/86- symptomatic. Pt independent PTA and lives with sister per report. Encouraged increasing activity and upright tolerance. Pt declined transfer to chair due to dizziness. Would benefit from post acute rehab to maximize independence and mobility prior to return home. Will follow acutely.    Follow Up Recommendations CIR;Supervision for mobility/OOB    Equipment Recommendations  Other (comment)(defer)    Recommendations for Other Services       Precautions / Restrictions Precautions Precautions: Fall Precaution Comments: orthostatic, watch HR/CB63 Restrictions Weight Bearing Restrictions: No      Mobility  Bed Mobility Overal bed mobility: Needs Assistance Bed Mobility: Rolling;Sidelying to Sit;Sit to Supine Rolling: Min assist Sidelying to sit: Mod assist;HOB elevated   Sit to supine: Min guard;HOB elevated   General bed mobility comments: Rolling to right/left for pericare; assist to elevate trunk and bring LEs off bed. Able to  return to supine without assist. + dizziness.   Transfers Overall transfer level: Needs assistance Equipment used: 2 person hand held assist Transfers: Sit to/from Stand Sit to Stand: Mod assist;+2 physical assistance         General transfer comment: ASsist of 2 to power to standing; unsteady. Stood from EOB x3. + dizziness.   Ambulation/Gait Ambulation/Gait assistance: Mod assist Gait Distance (Feet): 5 Feet Assistive device: 1 person hand held assist Gait Pattern/deviations: Step-to pattern Gait velocity: decreased   General Gait Details: Able to side step along side bed with Mod A for support. Bil knee instability noted. + dizziness.   Stairs            Wheelchair Mobility    Modified Rankin (Stroke Patients Only)       Balance Overall balance assessment: Needs assistance Sitting-balance support: Feet supported;Bilateral upper extremity supported Sitting balance-Leahy Scale: Fair Sitting balance - Comments: Able to sit EOB statically with close Min guard due to rocking back and forth, dizziness and impulsivity.   Standing balance support: During functional activity Standing balance-Leahy Scale: Poor Standing balance comment: Requires external support in standing                             Pertinent Vitals/Pain Pain Assessment: Faces Faces Pain Scale: Hurts even more Pain Location: everywhere esp bottom with pericare Pain Descriptors / Indicators: Guarding;Grimacing;Moaning;Sore Pain Intervention(s): Monitored during session;Repositioned    Home Living Family/patient expects to be discharged to:: Private residence Living Arrangements: Other relatives(with sister) Available Help at Discharge: Family;Available PRN/intermittently(reports his sister cannot assist at home) Type of Home: Apartment Home Access: Stairs to enter Entrance Stairs-Rails: Right Entrance Stairs-Number of Steps: 10 Home  Layout: One level Home Equipment: None       Prior Function Level of Independence: Independent         Comments: Independent with ADls/IADLs.     Hand Dominance        Extremity/Trunk Assessment   Upper Extremity Assessment Upper Extremity Assessment: Defer to OT evaluation    Lower Extremity Assessment Lower Extremity Assessment: Generalized weakness       Communication   Communication: No difficulties  Cognition Arousal/Alertness: Awake/alert Behavior During Therapy: Impulsive Overall Cognitive Status: Impaired/Different from baseline Area of Impairment: Orientation;Memory;Following commands;Safety/judgement;Awareness;Problem solving                 Orientation Level: Disoriented to;Time;Situation   Memory: Decreased short-term memory Following Commands: Follows multi-step commands inconsistently Safety/Judgement: Decreased awareness of deficits;Decreased awareness of safety Awareness: Intellectual Problem Solving: Slow processing;Requires verbal cues General Comments: Knows it is "2020" Thinks it is February. Does not know why he is here. Found incontinent of stool without knowledge. Pt continually trying to get up despite saying he feels so dizzy.      General Comments General comments (skin integrity, edema, etc.): HR ranged from 100-130s bpm, Sp02 dropped to 86% on 3.5L/min 02. Supine BP 142/72, Sitting BP 121/86- symptomatic    Exercises     Assessment/Plan    PT Assessment Patient needs continued PT services  PT Problem List Decreased strength;Decreased balance;Decreased cognition;Pain;Cardiopulmonary status limiting activity;Decreased mobility;Decreased activity tolerance       PT Treatment Interventions Functional mobility training;Balance training;Patient/family education;Gait training;Therapeutic activities;Therapeutic exercise;DME instruction;Stair training    PT Goals (Current goals can be found in the Care Plan section)  Acute Rehab PT Goals Patient Stated Goal: to get better PT  Goal Formulation: With patient Time For Goal Achievement: 10/09/18 Potential to Achieve Goals: Good    Frequency Min 3X/week   Barriers to discharge Decreased caregiver support;Inaccessible home environment sister cannot help at home; 10 stairs to enter apt    Co-evaluation               AM-PAC PT "6 Clicks" Mobility  Outcome Measure Help needed turning from your back to your side while in a flat bed without using bedrails?: A Lot Help needed moving from lying on your back to sitting on the side of a flat bed without using bedrails?: A Lot Help needed moving to and from a bed to a chair (including a wheelchair)?: A Lot Help needed standing up from a chair using your arms (e.g., wheelchair or bedside chair)?: A Lot Help needed to walk in hospital room?: A Lot Help needed climbing 3-5 steps with a railing? : Total 6 Click Score: 11    End of Session Equipment Utilized During Treatment: Gait belt;Oxygen Activity Tolerance: Treatment limited secondary to medical complications (Comment)(dizziness) Patient left: in bed;with call bell/phone within reach;with nursing/sitter in room(nurse left in room to donn condom cath ) Nurse Communication: Mobility status PT Visit Diagnosis: Unsteadiness on feet (R26.81);Muscle weakness (generalized) (M62.81);Difficulty in walking, not elsewhere classified (R26.2);Pain Pain - part of body: (everywhere)    Time: 1340-1403 PT Time Calculation (min) (ACUTE ONLY): 23 min   Charges:   PT Evaluation $PT Eval Moderate Complexity: 1 Mod PT Treatments $Therapeutic Activity: 8-22 mins        Omar Wood, PT, DPT Acute Rehabilitation Services Pager [phone] Office [phone]      Omar Wood Ensign 09/25/2018, 2:24 PM

## 2018-09-25 NOTE — Progress Notes (Signed)
Modified Barium Swallow Progress Note  Patient Details  Name: Omar Wood MRN: [id number] Date of Birth: [birth year]  Today's Date: 09/25/2018  Modified Barium Swallow completed.  Full report located under Chart Review in the Imaging Section.  Brief recommendations include the following:  Clinical Impression  Pt presents with a primary oral phase dysphagia with dry lingual mucosa, lesions from ET tube and baseline short lingual frenum (tongue tie) that limits mobility of lingual tip. Due to these impairments pt needs extra time and effort to form bolus with consistent premature spillage of liquids and mixed consistencies to pyriforms. Very slight delay in swallow initiation also present at times. Only one instance of trace sensed aspiration with ejection occurred when taking pill with thin liquids. Pt is at increased risk given generalized weakness but expect oral function to improve with intake as mucosa will become more healthy with PO intake. Will follow for tolerance. Pt to start dys 2/thin, pills whole in puree.    Swallow Evaluation Recommendations       SLP Diet Recommendations: Dysphagia 2 (Fine chop) solids;Thin liquid   Liquid Administration via: Cup;Straw   Medication Administration: Whole meds with puree   Supervision: Patient able to self feed;Intermittent supervision to cue for compensatory strategies   Compensations: Slow rate;Small sips/bites   Postural Changes: Seated upright at 90 degrees   Oral Care Recommendations: Oral care BID   Other Recommendations: Have oral suction available   Harlon Ditty, MA CCC-SLP  Acute Rehabilitation Services Pager [phone] Office [phone]  Lanell Carpenter, Riley Nearing 09/25/2018,10:36 AM

## 2018-09-25 NOTE — Progress Notes (Signed)
PROGRESS NOTE   Omar Wood  HTX:[id number]    DOB: 05-Feb-1961    DOA: 09/17/2018  PCP: System, Pcp Not In   I have briefly reviewed patients previous medical records in [facility].  Brief Narrative:  58 year old male, PMH of DM 2, HTN, HLD, anxiety disorder/bipolar disorder, tobacco abuse, polysubstance abuse, presented to [facility] ED on 2/26 unresponsive with assisted ventilation by bag valve mask and was intubated upon arrival.  Initial work-up: UDS negative, lipase 1371, glucose 942, BUN 71, creatinine 3.88, sodium 114, potassium 5.5, chloride 67, BAL <10, WBC 27, urine microscopy positive for ketones.  He was hypotensive on pressors.  He was transferred to [facility] ICU under CCM care for further management.  He was admitted for DKA, acute metabolic encephalopathy, septic shock from pneumonia and acute hypoxic respiratory failure.  After improvement and stabilization, care transferred to [facility] on 3/5.   Assessment & Plan:   Active Problems:   DKA (diabetic ketoacidoses) (HCC)   Shock (HCC)   1. Septic shock due to group B strep and Klebsiella pneumonia: Required pressors early on in admission.  Completed 7 days course of antibiotics. 2. Acute renal failure due to ATN: Nonoliguric.  Nephrology follow-up appreciated.  Creatinine continues to improve, down to 2.19 today.  Avoid nephrotoxic's.  Continue to monitor BMP daily. 3. Hypernatremia: Likely due to post ATN diuresis.  Received brief IV D5W overnight.  Sodium up to 152.  Speech therapy has cleared for modified diet.  Start free water orally.  Currently on IV D5W which may worsen hyperglycemia and insulins may need to be adjusted or D5 infusion may have to be stopped eventually.  Follow BMP daily. 4. DM2 not at goal/DKA: Patient was on insulins PTA and claims compliance.  However A1c 2/28: 11.4 indicates very poor outpatient control.  Increased Levemir to 15 units daily, continue NovoLog SSI.  Monitor CBGs closely and uptitrate  insulins as needed.  DM coordinator input appreciated.  DKA resolved.  Metformin held. 5. Essential hypertension: Blood pressure starting to creep up.  Continue to hold lisinopril but resume Inderal. 6. Hyperlipidemia: Hold statins due to abnormal LFTs. 7. Abnormal LFTs: In the setting of septic shock.  AST and ALT peaked in the 781-815 range.  Continue to improve.  Follow LFTs daily.  RUQ ultrasound unremarkable. 8. Anxiety disorder/bipolar disorder: Continue Cymbalta, Seroquel and trazodone as per prior home dose.  Delirium precautions. 9. Dysphagia: Speech therapy evaluated 3/5 and recommend dysphagia 2 diet and thin liquids.  Monitor closely. 10. Normocytic anemia: Stable. 11. Thrombocytopenia: Transient and likely related to acute illness/infectious etiology.  Resolved. 12. Leukocytosis: Possibly stress response.  Completed antibiotic course for pneumonia.  Stable.  Follow CBC periodically. 13. Hypokalemia: Replace and follow.  Magnesium normal. 14. Deconditioning: Mobilize, PT and OT evaluation and may need SNF. 15. Rhabdomyolysis: Improved.  Follow CK.  This may also be a reason for abnormal LFTs. 16. Tobacco/polysubstance abuse: UDS negative. 17. Scrotal/groin fungal appearing rash: Topical nystatin cream. 18. Acute encephalopathy: Present on admission.  Likely related to DKA, septic shock, pneumonia and acute kidney injury.  Improved or even resolved.   DVT prophylaxis: Subcutaneous heparin Code Status: Full Family Communication: None at bedside Disposition: Admitted to [facility] ICU.  On 3/5 transferred to medical telemetry   Consultants:  CCM-signed off 3/4. Nephrology  Procedures:  ETT 2/26 >>  Lt IJ 2/26 >>  Lt radial A line 2/26 >>  Antimicrobials:  Rocephin 2/26 >> 3/3 Zithromax 2/26 >> 2/27  Cefepime 2/28 >> 2/29   Subjective: Patient interviewed and examined this morning along with RN.  Alert and oriented x3.  Denies complaints.  No dyspnea or pain reported.  Able to  tell the insulins that he takes at home and claims compliance.  However reports that he does not have a PCP.  ROS: As above, otherwise negative  Objective:  Vitals:   09/25/18 0600 09/25/18 0700 09/25/18 0800 09/25/18 0807  BP: 137/71 132/66 (!) 112/98   Pulse: (!) 102 (!) 111 (!) 114   Resp: (!) 26 (!) 27 (!) 26   Temp:    97.8 F (36.6 C)  TempSrc:    Oral  SpO2: 100% 93% 92%   Weight:      Height:        Examination:  General exam: Pleasant middle-age male, moderately built and nourished lying comfortably propped up in bed. Oral cavity: Poor hygiene.  Lips dry.  Poor dental hygiene, missing multiple teeth.  No thrush.  No acute findings noted. Respiratory system: Slightly diminished breath sounds in the bases but otherwise clear to auscultation.  No increased work of breathing. Cardiovascular system: S1 & S2 heard, RRR. No JVD, murmurs, rubs, gallops or clicks. No pedal edema.  Telemetry personally reviewed: Sinus tachycardia in the 100s. Gastrointestinal system: Abdomen is nondistended, soft and nontender. No organomegaly or masses felt. Normal bowel sounds heard. Central nervous system: Alert and oriented x3. No focal neurological deficits. Extremities: Symmetric 5 x 5 power. Skin: Fungal appearing rash with mild excoriation of scrotum and right side of groin. Psychiatry: Judgement and insight appear normal. Mood & affect appropriate.     Data Reviewed: I have personally reviewed following labs and imaging studies  CBC: Recent Labs  Lab 09/20/18 0250 09/22/18 0409 09/23/18 0514 09/24/18 0306 09/25/18 0520  WBC 13.7* 8.3 12.5* 14.8* 15.3*  HGB 10.7* 9.4* 9.8* 9.5* 9.5*  HCT 30.3* 26.8* 29.6* 29.1* 29.8*  MCV 81.2 83.2 87.1 90.7 92.8  PLT 141* PLATELET CLUMPS NOTED ON SMEAR, COUNT APPEARS DECREASED 173 233 377   Basic Metabolic Panel: Recent Labs  Lab 09/22/18 1800 09/23/18 0514 09/23/18 1941 09/24/18 0306 09/24/18 1651 09/25/18 0520  NA 142 142  --  150*  151* 152*  K 3.0* 3.0* 3.6 3.4* 4.4 3.4*  CL 100 102  --  108 107 111  CO2 34* 32  --  33* 27 29  GLUCOSE 139* 157*  --  136* 268* 259*  BUN 89* 83*  --  63* 56* 55*  CREATININE 3.73* 3.38*  --  2.55* 2.52* 2.19*  CALCIUM 6.4* 6.6*  --  6.8* 6.7* 7.0*  MG 1.5*  --   --  2.0  --   --   PHOS 1.6*  --   --  2.5  --   --    Liver Function Tests: Recent Labs  Lab 09/19/18 0434 09/22/18 0409 09/23/18 0514 09/24/18 0306  AST 77* 781* 543* 397*  ALT 47* 815* 654* 509*  ALKPHOS 86 179* 253* 275*  BILITOT 0.3 0.7 0.6 0.8  PROT 4.6* 4.3* 4.4* 4.6*  ALBUMIN 1.7* 1.2* 1.3* 1.3*    Cardiac Enzymes: Recent Labs  Lab 09/19/18 0434 09/20/18 0250  CKTOTAL 1,557* 655*   CBG: Recent Labs  Lab 09/24/18 1542 09/24/18 1952 09/24/18 2341 09/25/18 0325 09/25/18 0817  GLUCAP 234* 171* 206* 214* 242*    Recent Results (from the past 240 hour(s))  MRSA PCR Screening     Status: None  Collection Time: 09/17/18  3:30 PM  Result Value Ref Range Status   MRSA by PCR NEGATIVE NEGATIVE Final    Comment:        The GeneXpert MRSA Assay (FDA approved for NASAL specimens only), is one component of a comprehensive MRSA colonization surveillance program. It is not intended to diagnose MRSA infection nor to guide or monitor treatment for MRSA infections. Performed at [facility], [address], [place], [place] [address]   Culture, blood (Routine X 2) w Reflex to ID Panel     Status: None   Collection Time: 09/17/18  6:49 PM  Result Value Ref Range Status   Specimen Description BLOOD RIGHT HAND  Final   Special Requests   Final    BOTTLES DRAWN AEROBIC ONLY Blood Culture results may not be optimal due to an inadequate volume of blood received in culture bottles Performed at [facility], [address], [place], [place] [address]    Culture NO GROWTH 5 DAYS  Final   Report Status 09/22/2018 FINAL  Final  Culture, blood (Routine X 2) w Reflex to ID Panel     Status: None     Collection Time: 09/17/18  6:54 PM  Result Value Ref Range Status   Specimen Description BLOOD LEFT HAND  Final   Special Requests   Final    BOTTLES DRAWN AEROBIC ONLY Blood Culture results may not be optimal due to an inadequate volume of blood received in culture bottles Performed at [facility], [address], [place], [place] [address]    Culture NO GROWTH 5 DAYS  Final   Report Status 09/22/2018 FINAL  Final  Culture, Urine     Status: None   Collection Time: 09/17/18  8:36 PM  Result Value Ref Range Status   Specimen Description URINE, RANDOM  Final   Special Requests NONE  Final   Culture   Final    NO GROWTH Performed at [facility], [address], [place], [place] [address]    Report Status 09/19/2018 FINAL  Final  Culture, respiratory (non-expectorated)     Status: None   Collection Time: 09/17/18 10:32 PM  Result Value Ref Range Status   Specimen Description TRACHEAL ASPIRATE  Final   Special Requests NONE  Final   Gram Stain   Final    FEW WBC PRESENT, PREDOMINANTLY PMN ABUNDANT GRAM POSITIVE COCCI RARE GRAM POSITIVE RODS RARE YEAST    Culture   Final    FEW KLEBSIELLA PNEUMONIAE MODERATE GROUP B STREP(S.AGALACTIAE)ISOLATED TESTING AGAINST S. AGALACTIAE NOT ROUTINELY PERFORMED DUE TO PREDICTABILITY OF AMP/PEN/VAN SUSCEPTIBILITY. Performed at [facility], [address] [place], [place] [address]    Report Status 09/20/2018 FINAL  Final   Organism ID, Bacteria KLEBSIELLA PNEUMONIAE  Final      Susceptibility   Klebsiella pneumoniae - MIC*    AMPICILLIN RESISTANT Resistant     CEFAZOLIN <=4 SENSITIVE Sensitive     CEFEPIME <=1 SENSITIVE Sensitive     CEFTAZIDIME <=1 SENSITIVE Sensitive     CEFTRIAXONE <=1 SENSITIVE Sensitive     CIPROFLOXACIN <=0.25 SENSITIVE Sensitive     GENTAMICIN <=1 SENSITIVE Sensitive     IMIPENEM <=0.25 SENSITIVE Sensitive     TRIMETH/SULFA <=20 SENSITIVE Sensitive     AMPICILLIN/SULBACTAM 4 SENSITIVE  Sensitive     PIP/TAZO <=4 SENSITIVE Sensitive     Extended ESBL NEGATIVE Sensitive     * FEW KLEBSIELLA PNEUMONIAE  Radiology Studies: Dg Chest Port 1 View  Result Date: 09/24/2018 CLINICAL DATA:  Community-acquired pneumonia EXAM: PORTABLE CHEST 1 VIEW COMPARISON:  09/23/2018 FINDINGS: Cardiac shadow is stable. Left jugular central line is again noted in satisfactory position. The endotracheal tube and nasogastric catheter have been removed in the interval. Increasing patchy infiltrates are noted bilaterally as well as central vascular congestion and edema. IMPRESSION: Increasing patchy infiltrates bilaterally. Central vascular congestion and interstitial edema. Electronically Signed   By: Alcide Clever M.D.   On: 09/24/2018 07:16        Scheduled Meds: . Chlorhexidine Gluconate Cloth  6 each Topical Daily  . DULoxetine  60 mg Oral BID  . feeding supplement (ENSURE ENLIVE)  237 mL Oral TID BM  . heparin  5,000 Units Subcutaneous Q8H  . insulin aspart  0-9 Units Subcutaneous Q4H  . insulin detemir  15 Units Subcutaneous Daily  . mouth rinse  15 mL Mouth Rinse q12n4p  . nystatin cream   Topical BID  . pantoprazole sodium  40 mg Per Tube Daily  . QUEtiapine  100 mg Oral QHS  . sodium chloride flush  10-40 mL Intracatheter Q12H  . traZODone  50 mg Oral QHS   Continuous Infusions: . dextrose 75 mL/hr at 09/25/18 0853  . potassium chloride       LOS: 8 days     Marcellus Scott, MD, FACP, [facility]. [facility]  To contact the attending provider between 7A-7P or the covering provider during after hours 7P-7A, please log into the web site [email or web address] and access using universal [place] password for that web site. If you do not have the password, please call the hospital operator.  09/25/2018, 12:24 PM

## 2018-09-25 NOTE — Progress Notes (Signed)
[facility] NEPHROLOGY PROGRESS NOTE  Assessment/ Plan: Pt is a 58 y.o. yo male male with anxiety bipolar, polysubstance abuse, type 2 diabetes, hypertension transferred from [facility] after found down for unknown time with AMS, DKA and shock.  #Nonoliguric AKI due to ATN in the setting of septic shock and ACE inhibition.   -Good urine output and serum creatinine level trending down to 2.19 today. -Monitor urine output, electrolytes and serum creatinine level.  Avoid IV contrast NSAIDs.  # Hypernatremia: He has post ATN diuresis and decreased oral intake.  He has free water deficit.  Currently n.p.o. and has no feeding tube for free water.  He is going for swallow evaluation today.  He did not receive hypotonic saline yesterday.  Currently on D5W.  Continue to monitor lab.  #Hypokalemia in the setting of recovering AKI with increased urinary loss.  Ordered IV potassium chloride.  Monitor labs.  #Metabolic acidosis improved.    #Ventilator dependent respiratory failure per PCCM patient is extubated  #Acute metabolic encephalopathy:mental status improving  #DKA improved  #Hypotension: Blood pressure is better now.  Off pressor.  Subjective: Seen and examined in ICU.  Reports dry mouth and now waiting for swallow evaluation.  Did not receive hypotonic saline yesterday.  Currently on D5W.  Objective Vital signs in last 24 hours: Vitals:   09/25/18 0600 09/25/18 0700 09/25/18 0800 09/25/18 0807  BP: 137/71 132/66 (!) 112/98   Pulse: (!) 102 (!) 111 (!) 114   Resp: (!) 26 (!) 27 (!) 26   Temp:    97.8 F (36.6 C)  TempSrc:    Oral  SpO2: 100% 93% 92%   Weight:      Height:       Weight change: -5.4 kg  Intake/Output Summary (Last 24 hours) at 09/25/2018 1023 Last data filed at 09/25/2018 0800 Gross per 24 hour  Intake 853.51 ml  Output 850 ml  Net 3.51 ml       Labs: Basic Metabolic Panel: Recent Labs  Lab 09/22/18 1800  09/24/18 0306 09/24/18 1651  09/25/18 0520  NA 142   < > 150* 151* 152*  K 3.0*   < > 3.4* 4.4 3.4*  CL 100   < > 108 107 111  CO2 34*   < > 33* 27 29  GLUCOSE 139*   < > 136* 268* 259*  BUN 89*   < > 63* 56* 55*  CREATININE 3.73*   < > 2.55* 2.52* 2.19*  CALCIUM 6.4*   < > 6.8* 6.7* 7.0*  PHOS 1.6*  --  2.5  --   --    < > = values in this interval not displayed.   Liver Function Tests: Recent Labs  Lab 09/22/18 0409 09/23/18 0514 09/24/18 0306  AST 781* 543* 397*  ALT 815* 654* 509*  ALKPHOS 179* 253* 275*  BILITOT 0.7 0.6 0.8  PROT 4.3* 4.4* 4.6*  ALBUMIN 1.2* 1.3* 1.3*   No results for input(s): LIPASE, AMYLASE in the last 168 hours. No results for input(s): AMMONIA in the last 168 hours. CBC: Recent Labs  Lab 09/20/18 0250 09/22/18 0409 09/23/18 0514 09/24/18 0306 09/25/18 0520  WBC 13.7* 8.3 12.5* 14.8* 15.3*  HGB 10.7* 9.4* 9.8* 9.5* 9.5*  HCT 30.3* 26.8* 29.6* 29.1* 29.8*  MCV 81.2 83.2 87.1 90.7 92.8  PLT 141* PLATELET CLUMPS NOTED ON SMEAR, COUNT APPEARS DECREASED 173 233 377   Cardiac Enzymes: Recent Labs  Lab 09/18/18 1141 09/19/18 0434 09/20/18 0250  CKTOTAL 2,047* 1,557* 655*   CBG: Recent Labs  Lab 09/24/18 1542 09/24/18 1952 09/24/18 2341 09/25/18 0325 09/25/18 0817  GLUCAP 234* 171* 206* 214* 242*    Iron Studies: No results for input(s): IRON, TIBC, TRANSFERRIN, FERRITIN in the last 72 hours. Studies/Results: Dg Chest Port 1 View  Result Date: 09/24/2018 CLINICAL DATA:  Community-acquired pneumonia EXAM: PORTABLE CHEST 1 VIEW COMPARISON:  09/23/2018 FINDINGS: Cardiac shadow is stable. Left jugular central line is again noted in satisfactory position. The endotracheal tube and nasogastric catheter have been removed in the interval. Increasing patchy infiltrates are noted bilaterally as well as central vascular congestion and edema. IMPRESSION: Increasing patchy infiltrates bilaterally. Central vascular congestion and interstitial edema. Electronically Signed   By:  Alcide Clever M.D.   On: 09/24/2018 07:16    Medications: Infusions: . dextrose 75 mL/hr at 09/25/18 0853    Scheduled Medications: . Chlorhexidine Gluconate Cloth  6 each Topical Daily  . DULoxetine  60 mg Oral BID  . heparin  5,000 Units Subcutaneous Q8H  . insulin aspart  0-9 Units Subcutaneous Q4H  . insulin detemir  15 Units Subcutaneous Daily  . mouth rinse  15 mL Mouth Rinse q12n4p  . nystatin cream   Topical BID  . pantoprazole sodium  40 mg Per Tube Daily  . pneumococcal 23 valent vaccine  0.5 mL Intramuscular Tomorrow-1000  . QUEtiapine  100 mg Oral QHS  . sodium chloride flush  10-40 mL Intracatheter Q12H  . traZODone  50 mg Oral QHS    have reviewed scheduled and prn medications.  Physical Exam: General: Not in distress, dry crusted mucous membrane Heart:RRR, s1s2 nl, no rubs Lungs:clear b/l, no crackle or wheeze Abdomen:soft, Non-tender, non-distended Extremities: Trace edema Neurology: Alert awake and following commands  Harjit Douds Prasad [place]ina Stanfield 09/25/2018,10:23 AM  LOS: 8 days

## 2018-09-25 NOTE — Progress Notes (Signed)
Nutrition Follow-up  DOCUMENTATION CODES:   Not applicable  INTERVENTION:   Ensure Enlive po TID between meals, each supplement provides 350 kcal and 20 grams of protein  Magic cup TID with meals, each supplement provides 290 kcal and 9 grams of protein  Add whole milk and pudding, yogurt to meal trays  Add MVI  NUTRITION DIAGNOSIS:   Moderate Malnutrition related to chronic illness(poorly controlled DM, polysubstance abuse) as evidenced by mild fat depletion, mild muscle depletion.  Being addressed as diet advanced, supplements  GOAL:   Patient will meet greater than or equal to 90% of their needs  Progressing  MONITOR:   PO intake, Supplement acceptance, Labs, Weight trends, Skin  REASON FOR ASSESSMENT:   Ventilator    ASSESSMENT:   58 year old male w/ PMH of T2DM, HTN, and bipolar disorder. Presented to [facility] ER after being found unresponsive. Once he arrived to ER he was intubated. Admitted to ICU for DKA.   Diet advanced to Dysphagia II. Pt sipping on water today. Pt with sores inside of mouth and on lips; pt reports eating/drinking is extremely painful. Even hurts with water. Discussed with RN, RN to address with MD to see if there is something we can order to numb or soothe mouth pain  Hypernatremic, D5 at 75 ml/hr. Diet now advanced, encourage liquids by mouth  Current wt 79.4 kg; admission wt 76.9 kg. Net + 8 L per I/O flow sheet; however noted multiple urine occurrences. Plan to utilize admission wt as dry wt. Noted hands, LE edema present  Pt reports 50 pounds weight loss over the past 6 months  due to poor appetite; pt has only been eating 1 meal and reports this meal might consist of Ensure or soft foods like ice cream, pudding, milk, etc. Limited weight encounters do not reflect significant weight loss. Of note, pt with signs and symptoms of refeeding post initiation of TF while on vent indicating poor nutritional status/malnutrition at baseline  (hypokalemia, hypophosphatemia and hypomagnesemia)  Labs: sodium 152 (H), Creatinine 2.19, BUN 55, potassium 3.4, CBGs 171-242 Meds: ss novolog, levemir, KCl  NUTRITION - FOCUSED PHYSICAL EXAM:    Most Recent Value  Orbital Region  Mild depletion  Upper Arm Region  Mild depletion  Thoracic and Lumbar Region  Mild depletion  Buccal Region  Mild depletion  Temple Region  Mild depletion  Clavicle Bone Region  Mild depletion  Clavicle and Acromion Bone Region  Mild depletion  Scapular Bone Region  Mild depletion  Dorsal Hand  Unable to assess [hands swollen]  Patellar Region  Moderate depletion  Anterior Thigh Region  Moderate depletion  Posterior Calf Region  Unable to assess  Edema (RD Assessment)  Mild  Hair  Reviewed  Eyes  Unable to assess  Mouth  Unable to assess  Skin  Reviewed  Nails  Unable to assess       Diet Order:   Diet Order            DIET DYS 2 Room service appropriate? Yes with Assist; Fluid consistency: Thin  Diet effective now              EDUCATION NEEDS:   No education needs have been identified at this time  Skin:  Skin Assessment: Reviewed RN Assessment  Last BM:  3/05  Height:   Ht Readings from Last 1 Encounters:  09/17/18 5' 11.65" (1.82 m)    Weight:   Wt Readings from Last 1 Encounters:  09/25/18 79.4 kg    Ideal Body Weight:  78.18 kg  BMI:  Body mass index is 23.97 kg/m.  Estimated Nutritional Needs:   Kcal:  2000-2200 kcals   Protein:  100-120 g  Fluid:  >/= 2 L   Romelle Starcher MS, RD, LDN, CNSC [phone] Pager  [phone] Weekend/On-Call Pager

## 2018-09-25 NOTE — Progress Notes (Signed)
Rehab Admissions Coordinator Note:  Patient was screened by Trish Mage for appropriateness for an Inpatient Acute Rehab Consult.  At this time, we are recommending Inpatient Rehab consult.  Lelon Frohlich M 09/25/2018, 2:36 PM  I can be reached at [phone].

## 2018-09-26 LAB — GLUCOSE, CAPILLARY
GLUCOSE-CAPILLARY: 280 mg/dL — AB (ref 70–99)
Glucose-Capillary: 336 mg/dL — ABNORMAL HIGH (ref 70–99)
Glucose-Capillary: 132 mg/dL — ABNORMAL HIGH (ref 70–99)
Glucose-Capillary: 118 mg/dL — ABNORMAL HIGH (ref 70–99)
Glucose-Capillary: 83 mg/dL (ref 70–99)

## 2018-09-26 LAB — COMPREHENSIVE METABOLIC PANEL
ALT: 222 U/L — ABNORMAL HIGH (ref 0–44)
AST: 55 U/L — ABNORMAL HIGH (ref 15–41)
Albumin: 1.6 g/dL — ABNORMAL LOW (ref 3.5–5.0)
Alkaline Phosphatase: 192 U/L — ABNORMAL HIGH (ref 38–126)
Anion gap: 13 (ref 5–15)
BUN: 41 mg/dL — ABNORMAL HIGH (ref 6–20)
CO2: 27 mmol/L (ref 22–32)
Calcium: 7.3 mg/dL — ABNORMAL LOW (ref 8.9–10.3)
Chloride: 108 mmol/L (ref 98–111)
Creatinine, Ser: 1.73 mg/dL — ABNORMAL HIGH (ref 0.61–1.24)
GFR calc Af Amer: 50 mL/min — ABNORMAL LOW (ref >60)
GFR calc non Af Amer: 43 mL/min — ABNORMAL LOW (ref >60)
Glucose, Bld: 372 mg/dL — ABNORMAL HIGH (ref 70–99)
POTASSIUM: 4 mmol/L (ref 3.5–5.1)
Sodium: 148 mmol/L — ABNORMAL HIGH (ref 135–145)
Total Bilirubin: 0.5 mg/dL (ref 0.3–1.2)
Total Protein: 5 g/dL — ABNORMAL LOW (ref 6.5–8.1)

## 2018-09-26 LAB — CBC
HCT: 30.7 % — ABNORMAL LOW (ref 39.0–52.0)
Hemoglobin: 9.4 g/dL — ABNORMAL LOW (ref 13.0–17.0)
MCH: 28.8 pg (ref 26.0–34.0)
MCHC: 30.6 g/dL (ref 30.0–36.0)
MCV: 94.2 fL (ref 80.0–100.0)
Platelets: 365 10*3/uL (ref 150–400)
RBC: 3.26 MIL/uL — ABNORMAL LOW (ref 4.22–5.81)
RDW: 14.2 % (ref 11.5–15.5)
WBC: 10.5 10*3/uL (ref 4.0–10.5)
nRBC: 0 % (ref 0.0–0.2)

## 2018-09-26 MED ORDER — FLUCONAZOLE 100 MG PO TABS
200.0000 mg | ORAL_TABLET | Freq: Every day | ORAL | Status: DC
  Administered 2018-09-27 – 2018-09-28 (×2): 200 mg via ORAL
  Filled 2018-09-26 (×2): qty 2

## 2018-09-26 MED ORDER — INSULIN DETEMIR 100 UNIT/ML ~~LOC~~ SOLN
25.0000 [IU] | Freq: Every day | SUBCUTANEOUS | Status: DC
  Administered 2018-09-26 – 2018-09-27 (×2): 25 [IU] via SUBCUTANEOUS
  Filled 2018-09-26 (×2): qty 0.25

## 2018-09-26 MED ORDER — FLUCONAZOLE 200 MG PO TABS
400.0000 mg | ORAL_TABLET | Freq: Once | ORAL | Status: AC
  Administered 2018-09-26: 400 mg via ORAL
  Filled 2018-09-26: qty 2

## 2018-09-26 MED ORDER — ACETAMINOPHEN 325 MG PO TABS
325.0000 mg | ORAL_TABLET | Freq: Four times a day (QID) | ORAL | Status: DC | PRN
  Administered 2018-09-29 – 2018-10-02 (×2): 325 mg via ORAL
  Filled 2018-09-26 (×2): qty 1

## 2018-09-26 MED ORDER — PROPRANOLOL HCL 10 MG PO TABS
10.0000 mg | ORAL_TABLET | Freq: Three times a day (TID) | ORAL | Status: DC
  Administered 2018-09-26: 10 mg via ORAL
  Filled 2018-09-26 (×4): qty 1

## 2018-09-26 NOTE — Progress Notes (Signed)
Inpatient Rehabilitation-Admissions Coordinator    Met with patient at the bedside to discuss team's recommendation for inpatient rehabilitation. Pt agitated and distracted during conversation regarding CIR program with [facility] unsure if any of the  information provided was retained. Due to condition, AC spoke with pt's sister Katharine Look via phone. AC updated Katharine Look on recommendation and that we would follow up with pt Monday to determine most appropriate post acute venue at that time.  Jhonnie Garner, OTR/L  Rehab Admissions Coordinator  [phone] 09/26/2018 5:49 PM

## 2018-09-26 NOTE — Progress Notes (Signed)
Pharmacy Antibiotic Note  Omar Wood is a 58 y.o. male admitted on 09/17/2018 with DKA. Patient reports painful midsternal area swallowing to both solids and liquids. MD suspects esophageal candidiasis.  Pharmacy has been consulted for fluconazole dosing. WBC is wnl and patient is afebrile.  Plan: Start fluconazole 400 mg PO x1 today, then 200 mg PO daily x 14 days total Monitor clinical improvement and renal function  Height: 5' 11.65" (182 cm) Weight: 174 lb 6.1 oz (79.1 kg) IBW/kg (Calculated) : 76.8  Temp (24hrs), Avg:98 F (36.7 C), Min:97.6 F (36.4 C), Max:98.9 F (37.2 C)  Recent Labs  Lab 09/22/18 0409  09/23/18 0514 09/24/18 0306 09/24/18 1651 09/25/18 0520 09/26/18 0425  WBC 8.3  --  12.5* 14.8*  --  15.3* 10.5  CREATININE 3.95*   < > 3.38* 2.55* 2.52* 2.19* 1.73*   < > = values in this interval not displayed.    Estimated Creatinine Clearance: 51.2 mL/min (A) (by C-G formula based on SCr of 1.73 mg/dL (H)).    No Known Allergies  Antimicrobials this admission: Fluconazole 3/6 >> (3/19) Azithro 2/26 >> 2/28 CTX 2/26 >> 2/28; 2/29 > 3/3 Cefepime 2/28 >> 2/29  Microbiology results: 2/26 MRSA PCR: neg 2/26 BCx - negative 2/26 Ucx: negative 2/26 TA - few Klebsiella (pan sensitive except amp) + moderate group B strep  Thank you for allowing pharmacy to be a part of this patient's care.  Arvilla Market, PharmD PGY1 Pharmacy Resident Phone [phone] 09/26/2018     9:37 AM

## 2018-09-26 NOTE — Progress Notes (Signed)
Physical Therapy Treatment Patient Details Name: Omar Wood MRN: [id number] DOB: 1961/01/15 Today's Date: 09/26/2018    History of Present Illness Patient is a 58 y/o male who presented to [facility] ED on 2/26 unresponsive. Intubated 2/26-3/3. Pt was admitted for AMS, DKA, hypotension, septic shock with PNA, acute respiratory failure and acute metabolic encephalopathy. PMH includes HTN, DM, bipolar, tobacco abuse, polysubstance abuse.    PT Comments    Patient progressing with less assistance for bed mobility and transfer to chair. Very unsteady on feet, impulsive sitting with poor safety awareness. Cont to rec CIR at this time.     Follow Up Recommendations  CIR;Supervision for mobility/OOB     Equipment Recommendations  Other (comment)    Recommendations for Other Services       Precautions / Restrictions Precautions Precautions: Fall Precaution Comments: orthostatic, watch HC/WC37 Restrictions Weight Bearing Restrictions: No    Mobility  Bed Mobility Overal bed mobility: Needs Assistance Bed Mobility: Rolling;Sidelying to Sit;Sit to Supine Rolling: Min assist Sidelying to sit: Mod assist;HOB elevated   Sit to supine: Min guard;HOB elevated   General bed mobility comments: Rolling to right/left for pericare; assist to elevate trunk and bring LEs off bed. Able to return to supine without assist. + dizziness.   Transfers Overall transfer level: Needs assistance Equipment used: 2 person hand held assist Transfers: Sit to/from Stand Sit to Stand: Mod assist         General transfer comment: mod A to power up and provide stability during transfer   Ambulation/Gait             General Gait Details: side stepping along bed mulitple times   Stairs             Wheelchair Mobility    Modified Rankin (Stroke Patients Only)       Balance Overall balance assessment: Needs assistance Sitting-balance support: Feet supported;Bilateral upper  extremity supported Sitting balance-Leahy Scale: Fair Sitting balance - Comments: Able to sit EOB statically with close Min guard due to rocking back and forth, dizziness and impulsivity.   Standing balance support: During functional activity Standing balance-Leahy Scale: Poor Standing balance comment: Requires external support in standing                            Cognition Arousal/Alertness: Awake/alert Behavior During Therapy: Impulsive Overall Cognitive Status: Impaired/Different from baseline Area of Impairment: Orientation;Memory;Following commands;Safety/judgement;Awareness;Problem solving                 Orientation Level: Disoriented to;Time;Situation   Memory: Decreased short-term memory Following Commands: Follows multi-step commands inconsistently Safety/Judgement: Decreased awareness of deficits;Decreased awareness of safety Awareness: Intellectual Problem Solving: Slow processing;Requires verbal cues General Comments: very impulsive with poor safety awareness      Exercises      General Comments        Pertinent Vitals/Pain Pain Assessment: Faces Faces Pain Scale: Hurts little more Pain Location: everywhere esp bottom with pericare Pain Descriptors / Indicators: Guarding;Grimacing;Moaning;Sore Pain Intervention(s): Limited activity within patient's tolerance    Home Living                      Prior Function            PT Goals (current goals can now be found in the care plan section) Acute Rehab PT Goals Patient Stated Goal: to get better PT Goal Formulation: With patient Time For Goal  Achievement: 10/09/18 Potential to Achieve Goals: Good Progress towards PT goals: Progressing toward goals    Frequency    Min 3X/week      PT Plan Current plan remains appropriate    Co-evaluation              AM-PAC PT "6 Clicks" Mobility   Outcome Measure  Help needed turning from your back to your side while in a  flat bed without using bedrails?: A Lot Help needed moving from lying on your back to sitting on the side of a flat bed without using bedrails?: A Lot Help needed moving to and from a bed to a chair (including a wheelchair)?: A Lot Help needed standing up from a chair using your arms (e.g., wheelchair or bedside chair)?: A Lot Help needed to walk in hospital room?: A Lot Help needed climbing 3-5 steps with a railing? : Total 6 Click Score: 11    End of Session Equipment Utilized During Treatment: Gait belt;Oxygen Activity Tolerance: Treatment limited secondary to medical complications (Comment) Patient left: in bed;with call bell/phone within reach;with nursing/sitter in room Nurse Communication: Mobility status PT Visit Diagnosis: Unsteadiness on feet (R26.81);Muscle weakness (generalized) (M62.81);Difficulty in walking, not elsewhere classified (R26.2);Pain     Time: 1450-1515 PT Time Calculation (min) (ACUTE ONLY): 25 min  Charges:  $Therapeutic Activity: 23-37 mins                     Etta Grandchild, PT, DPT Acute Rehabilitation Services Pager: [phone] Office: [phone]     Etta Grandchild 09/26/2018, 3:54 PM

## 2018-09-26 NOTE — Progress Notes (Signed)
  Speech Language Pathology Treatment: Dysphagia  Patient Details Name: Omar Wood MRN: [id number] DOB: May 28, 1961 Today's Date: 09/26/2018 Time: 5615-3794 SLP Time Calculation (min) (ACUTE ONLY): 14 min  Assessment / Plan / Recommendation Clinical Impression  Following today for safety with recommendations after yesterday's MBS recommendation of Dys 2 (minced-ground) and thin liquids. Reported episode of emesis this morning versus aspiration with milk via straw per RN. Pt stated he "threw up" but cognition may be unreliable. Delayed cough with thin water via straw x 2, not present with cup sips. Labial leakage with cup but velocity to pharynx may (?) be slower. Reiterated importance of upright positioning and small sips. Manipulated graham cracker in applesauce timely. Continue Dys 2, thin via cup, no straws, pills crushed and rest breaks if needed due to deconditioning.    HPI HPI: Pt is a 58 yo male smoker transferred from [facility] 2/26 with PMH of DM, Bipolar, Anxiety, Polysubstance abuse, HTN and HLD presenting with DKA with altered mental status, hypotension, and respiratory failure. ETT:  2/26-3/3. Reported by MD to be coughing on ice chips post extubation.      SLP Plan  Continue with current plan of care       Recommendations  Diet recommendations: Dysphagia 2 (fine chop);Thin liquid Liquids provided via: Cup;No straw Medication Administration: Crushed with puree Supervision: Patient able to self feed;Intermittent supervision to cue for compensatory strategies Compensations: Slow rate;Small sips/bites Postural Changes and/or Swallow Maneuvers: Seated upright 90 degrees                Oral Care Recommendations: Oral care BID Follow up Recommendations: 24 hour supervision/assistance SLP Visit Diagnosis: Dysphagia, oral phase (R13.11) Plan: Continue with current plan of care       GO                Royce Macadamia 09/26/2018, 12:17 PM  Breck Coons  Lonell Face.Ed Nurse, children's [phone] Office [phone]

## 2018-09-26 NOTE — Progress Notes (Signed)
[facility] NEPHROLOGY PROGRESS NOTE  Assessment/ Plan: Pt is a 58 y.o. yo male male with anxiety bipolar, polysubstance abuse, type 2 diabetes, hypertension transferred from [facility] after found down for unknown time with AMS, DKA and shock.  #Nonoliguric AKI due to ATN in the setting of septic shock and ACE inhibition.   -Good urine output and serum creatinine level trending down to 1.73 today. -Monitor urine output, electrolytes and serum creatinine level.  Avoid IV contrast NSAIDs.  # Hypernatremia: He has post ATN diuresis and decreased oral intake.  He has free water deficit.  Serum sodium level improved with D5.  Patient is able to take orally now.  Continue free water and monitor labs.  Discussed with the primary team.  #Hypokalemia in the setting of recovering AKI with increased urinary loss.  Serum potassium level acceptable.  Encourage oral intake.  Monitor labs.  #Metabolic acidosis improved.    #Ventilator dependent respiratory failure per PCCM patient is extubated  #Acute metabolic encephalopathy:mental status improved  #DKA improved  #Hypotension: Blood pressure is better now.  Off pressor.  I will sign off at this time, call us with  question.  Discussed with primary team.  Subjective: Seen and examined in ICU.  Patient is able to take orally.  Mental status improved.  He is nonoliguric and kidney function continue to improve. Objective Vital signs in last 24 hours: Vitals:   09/26/18 0400 09/26/18 0500 09/26/18 0600 09/26/18 0737  BP: (!) 148/69  (!) 153/79   Pulse: (!) 104 (!) 106 (!) 110   Resp: (!) 24 (!) 27 20   Temp:    98.9 F (37.2 C)  TempSrc:    Oral  SpO2: 98% 98% 98%   Weight:      Height:       Weight change: -0.3 kg  Intake/Output Summary (Last 24 hours) at 09/26/2018 0930 Last data filed at 09/26/2018 0800 Gross per 24 hour  Intake 2456.96 ml  Output 1155 ml  Net 1301.96 ml       Labs: Basic Metabolic Panel: Recent Labs   Lab 09/22/18 1800  09/24/18 0306 09/24/18 1651 09/25/18 0520 09/26/18 0425  NA 142   < > 150* 151* 152* 148*  K 3.0*   < > 3.4* 4.4 3.4* 4.0  CL 100   < > 108 107 111 108  CO2 34*   < > 33* 27 29 27   GLUCOSE 139*   < > 136* 268* 259* 372*  BUN 89*   < > 63* 56* 55* 41*  CREATININE 3.73*   < > 2.55* 2.52* 2.19* 1.73*  CALCIUM 6.4*   < > 6.8* 6.7* 7.0* 7.3*  PHOS 1.6*  --  2.5  --   --   --    < > = values in this interval not displayed.   Liver Function Tests: Recent Labs  Lab 09/24/18 0306 09/25/18 0520 09/26/18 0425  AST 397* 136* 55*  ALT 509* 314* 222*  ALKPHOS 275* 220* 192*  BILITOT 0.8 1.1 0.5  PROT 4.6* 4.7* 5.0*  ALBUMIN 1.3* 1.4* 1.6*   No results for input(s): LIPASE, AMYLASE in the last 168 hours. No results for input(s): AMMONIA in the last 168 hours. CBC: Recent Labs  Lab 09/22/18 0409 09/23/18 0514 09/24/18 0306 09/25/18 0520 09/26/18 0425  WBC 8.3 12.5* 14.8* 15.3* 10.5  HGB 9.4* 9.8* 9.5* 9.5* 9.4*  HCT 26.8* 29.6* 29.1* 29.8* 30.7*  MCV 83.2 87.1 90.7 92.8 94.2  PLT  PLATELET CLUMPS NOTED ON SMEAR, COUNT APPEARS DECREASED 173 233 377 365   Cardiac Enzymes: Recent Labs  Lab 09/20/18 0250 09/25/18 0520  CKTOTAL 655* 60   CBG: Recent Labs  Lab 09/25/18 1243 09/25/18 1625 09/25/18 1941 09/25/18 2109 09/26/18 0737  GLUCAP 281* 325* 278* 292* 336*    Iron Studies: No results for input(s): IRON, TIBC, TRANSFERRIN, FERRITIN in the last 72 hours. Studies/Results: No results found.  Medications: Infusions:   Scheduled Medications: . DULoxetine  60 mg Oral BID  . feeding supplement (ENSURE ENLIVE)  237 mL Oral TID BM  . free water  100 mL Oral Q4H  . heparin  5,000 Units Subcutaneous Q8H  . insulin aspart  0-15 Units Subcutaneous TID WC  . insulin aspart  0-5 Units Subcutaneous QHS  . insulin detemir  25 Units Subcutaneous Daily  . mouth rinse  15 mL Mouth Rinse q12n4p  . nystatin cream   Topical BID  . pantoprazole sodium  40  mg Oral Daily  . propranolol  10 mg Oral TID  . QUEtiapine  100 mg Oral QHS  . traZODone  50 mg Oral QHS    have reviewed scheduled and prn medications.  Physical Exam: General: Not in distress, dry mucous membrane Heart:RRR, s1s2 nl, no rubs Lungs: Clear bilateral, no wheezing or crackle Abdomen:soft, Non-tender, non-distended Extremities: Trace edema Neurology: Alert awake and following commands   Prasad  09/26/2018,9:30 AM  LOS: 9 days

## 2018-09-26 NOTE — H&P (Addendum)
Physical Medicine and Rehabilitation Consult Reason for Consult: Decreased functional mobility Referring Physician: [facility]   HPI: ROARY CURB is a 58 y.o.right handed male with history of bipolar disorder maintained on Cymbalta 60 mg twice daily, Seroquel 400 mg at bedtime Inderal 10 mg 3 times daily and trazodone,insulin-dependent diabetes mellitus , hyperlipidemia, hypertension maintained on lisinopril 5 mg daily, marijuana and tobacco use. Per chart review patient lives with sister. One level home with 7 steps to entry. Independent prior to admission. Patient initially presented [facility] EGD 09/17/2018 after being found unresponsive. Downtime was unknown. Patient did require intubation. Initial workup showed UDS negative, troponin 0.02, lipase 1371, BUN 71 creatinine 3.88, sodium 114, WBC 26,800, alcohol negative, urinalysis glucose greater than 1000, ketones 40, lactic acid 1.7. he was hypotensive maintained on vasopressors. Patient was transferred to [facility] for further care. Cranial CT scan negative for intracranial process. Chest x-ray with persistent perihilar and bibasilar opacities presumably mild pulmonary edema.respiratory culture abundant gram-positive cocci few Klebsiella. Maintained on broad-spectrum antibiotics. Leukocytosis felt to be stress reaction with latest WBC 10,500.  Pressors ongoing for suspect septic shock. Patient did complete a seven-day course of antibiotics. Renal services consulted for acute renal injury felt related to ATN with gentle IV fluids creatinine improved to 1.73. hyponatremia improved after normal saline IV fluids. Patient remained intubated through 09/23/2018. Dysphagia #2 thin liquid diet. Subcutaneous heparin for DVT prophylaxis. Therapies initiated 09/25/2018 noting dizziness, impaired balance impulsivity. Recommendations for physical medicine rehabilitation consult.  Family history. Unobtainable Review of Systems  Constitutional:  Negative for chills and fever.  HENT: Negative for hearing loss.   Eyes: Negative for blurred vision and double vision.  Respiratory: Positive for cough. Negative for shortness of breath.   Cardiovascular: Negative for chest pain, palpitations and leg swelling.  Gastrointestinal: Positive for constipation and heartburn. Negative for nausea and vomiting.  Genitourinary: Positive for urgency. Negative for dysuria, flank pain and hematuria.  Musculoskeletal: Positive for myalgias.  Skin: Negative for rash.  Neurological:       Occasional headaches  Psychiatric/Behavioral: The patient has insomnia.        Bipolar disorder, anxiety  All other systems reviewed and are negative.  Past Medical History:  Diagnosis Date  . Bipolar disorder (HCC)   . Diabetes mellitus without complication (HCC)   . Hypercholesteremia   . Hypertension   . Marijuana user   . Scrotal injury   . Tobacco abuse    Past Surgical History:  Procedure Laterality Date  . APPENDECTOMY      Social History:  reports that he has quit smoking. His smoking use included cigarettes. He has never used smokeless tobacco. He reports that he does not drink alcohol or use drugs. Allergies: No Known Allergies Medications Prior to Admission  Medication Sig Dispense Refill  . atorvastatin (LIPITOR) 40 MG tablet Take 40 mg by mouth daily.    . DULoxetine (CYMBALTA) 60 MG capsule Take 60 mg by mouth 2 (two) times daily.    Marland Kitchen esomeprazole (NEXIUM) 40 MG capsule Take 40 mg by mouth daily at 12 noon.    . Insulin Glargine (BASAGLAR KWIKPEN) 100 UNIT/ML SOPN Inject 40 Units into the skin at bedtime.    . insulin lispro (HUMALOG) 100 UNIT/ML injection Inject 15 Units into the skin 3 (three) times daily before meals.    Marland Kitchen lisinopril (PRINIVIL,ZESTRIL) 5 MG tablet Take 5 mg by mouth daily.    . Melatonin 3 MG TABS Take  3 mg by mouth at bedtime.    . metFORMIN (GLUCOPHAGE-XR) 500 MG 24 hr tablet Take 1,000 mg by mouth 2 (two) times daily.     . ondansetron (ZOFRAN-ODT) 4 MG disintegrating tablet Take 4 mg by mouth every 6 (six) hours as needed for nausea or vomiting.    . propranolol (INDERAL) 10 MG tablet Take 10 mg by mouth 3 (three) times daily.    . QUEtiapine (SEROQUEL) 400 MG tablet Take 400 mg by mouth at bedtime.    . traZODone (DESYREL) 50 MG tablet Take 50 mg by mouth at bedtime.      Home: Home Living Family/patient expects to be discharged to:: Private residence Living Arrangements: Other relatives(with sister) Available Help at Discharge: Family, Available PRN/intermittently(reports his sister cannot assist at home) Type of Home: Apartment Home Access: Stairs to enter Secretary/administrator of Steps: 10 Entrance Stairs-Rails: Right Home Layout: One level Firefighter: Standard Home Equipment: None  Functional History: Prior Function Level of Independence: Independent Comments: Independent with ADls/IADLs. Functional Status:  Mobility: Bed Mobility Overal bed mobility: Needs Assistance Bed Mobility: Rolling, Sidelying to Sit, Sit to Supine Rolling: Min assist Sidelying to sit: Mod assist, HOB elevated Sit to supine: Min guard, HOB elevated General bed mobility comments: Rolling to right/left for pericare; assist to elevate trunk and bring LEs off bed. Able to return to supine without assist. + dizziness.  Transfers Overall transfer level: Needs assistance Equipment used: 2 person hand held assist Transfers: Sit to/from Stand Sit to Stand: Mod assist, +2 physical assistance General transfer comment: ASsist of 2 to power to standing; unsteady. Stood from EOB x3. + dizziness.  Ambulation/Gait Ambulation/Gait assistance: Mod assist Gait Distance (Feet): 5 Feet Assistive device: 1 person hand held assist Gait Pattern/deviations: Step-to pattern General Gait Details: Able to side step along side bed with Mod A for support. Bil knee instability noted. + dizziness.  Gait velocity: decreased     ADL:    Cognition: Cognition Overall Cognitive Status: Impaired/Different from baseline Orientation Level: Oriented to person, Oriented to place Cognition Arousal/Alertness: Awake/alert Behavior During Therapy: Impulsive Overall Cognitive Status: Impaired/Different from baseline Area of Impairment: Orientation, Memory, Following commands, Safety/judgement, Awareness, Problem solving Orientation Level: Disoriented to, Time, Situation Memory: Decreased short-term memory Following Commands: Follows multi-step commands inconsistently Safety/Judgement: Decreased awareness of deficits, Decreased awareness of safety Awareness: Intellectual Problem Solving: Slow processing, Requires verbal cues General Comments: Knows it is "2020" Thinks it is February. Does not know why he is here. Found incontinent of stool without knowledge. Pt continually trying to get up despite saying he feels so dizzy.  Blood pressure (!) 153/79, pulse (!) 110, temperature 98.6 F (37 C), temperature source Oral, resp. rate 20, height 5' 11.65" (1.82 m), weight 79.1 kg, SpO2 98 %. Physical Exam  Constitutional: No distress.  HENT:  Head: Normocephalic and atraumatic.  Eyes: Pupils are equal, round, and reactive to light. EOM are normal.  Neck: Normal range of motion. Thyromegaly present.  Cardiovascular: Regular rhythm.  Tachycardic   Respiratory: Effort normal. No respiratory distress.  Frequent wet coughs  GI: Soft.  Musculoskeletal:        General: No edema.  Neurological:  Pt is alert. Follows commands. Voice dysphonic. Reasonable insight and awareness. Answered biographical questions. Motor 2-3/5 prox to distal in LE and 3/5 in UE prox to distal. Stocking glove sensory loss in LE below knees and below wrists in UE.   Skin: He is not diaphoretic.  Numerous scars and ecchymoses  Psychiatric:  Flat but cooperative    Results for orders placed or performed during the hospital encounter of 09/17/18  (from the past 24 hour(s))  Glucose, capillary     Status: Abnormal   Collection Time: 09/25/18  8:17 AM  Result Value Ref Range   Glucose-Capillary 242 (H) 70 - 99 mg/dL  Glucose, capillary     Status: Abnormal   Collection Time: 09/25/18 12:43 PM  Result Value Ref Range   Glucose-Capillary 281 (H) 70 - 99 mg/dL  Glucose, capillary     Status: Abnormal   Collection Time: 09/25/18  4:25 PM  Result Value Ref Range   Glucose-Capillary 325 (H) 70 - 99 mg/dL  Glucose, capillary     Status: Abnormal   Collection Time: 09/25/18  7:41 PM  Result Value Ref Range   Glucose-Capillary 278 (H) 70 - 99 mg/dL  Glucose, capillary     Status: Abnormal   Collection Time: 09/25/18  9:09 PM  Result Value Ref Range   Glucose-Capillary 292 (H) 70 - 99 mg/dL  CBC     Status: Abnormal   Collection Time: 09/26/18  4:25 AM  Result Value Ref Range   WBC 10.5 4.0 - 10.5 K/uL   RBC 3.26 (L) 4.22 - 5.81 MIL/uL   Hemoglobin 9.4 (L) 13.0 - 17.0 g/dL   HCT 68.1 (L) 27.5 - 17.0 %   MCV 94.2 80.0 - 100.0 fL   MCH 28.8 26.0 - 34.0 pg   MCHC 30.6 30.0 - 36.0 g/dL   RDW 01.7 49.4 - 49.6 %   Platelets 365 150 - 400 K/uL   nRBC 0.0 0.0 - 0.2 %  Comprehensive metabolic panel     Status: Abnormal   Collection Time: 09/26/18  4:25 AM  Result Value Ref Range   Sodium 148 (H) 135 - 145 mmol/L   Potassium 4.0 3.5 - 5.1 mmol/L   Chloride 108 98 - 111 mmol/L   CO2 27 22 - 32 mmol/L   Glucose, Bld 372 (H) 70 - 99 mg/dL   BUN 41 (H) 6 - 20 mg/dL   Creatinine, Ser 7.59 (H) 0.61 - 1.24 mg/dL   Calcium 7.3 (L) 8.9 - 10.3 mg/dL   Total Protein 5.0 (L) 6.5 - 8.1 g/dL   Albumin 1.6 (L) 3.5 - 5.0 g/dL   AST 55 (H) 15 - 41 U/L   ALT 222 (H) 0 - 44 U/L   Alkaline Phosphatase 192 (H) 38 - 126 U/L   Total Bilirubin 0.5 0.3 - 1.2 mg/dL   GFR calc non Af Amer 43 (L) >60 mL/min   GFR calc Af Amer 50 (L) >60 mL/min   Anion gap 13 5 - 15   No results found.   Assessment/Plan: Diagnosis: severe debility after septic shock  and associated complications 1. Does the need for close, 24 hr/day medical supervision in concert with the patient's rehab needs make it unreasonable for this patient to be served in a less intensive setting? Yes 2. Co-Morbidities requiring supervision/potential complications: ATN, dysphagia, severe diabetic peripheral neuroapthy 3. Due to bladder management, bowel management, safety, skin/wound care, disease management, medication administration, pain management and patient education, does the patient require 24 hr/day rehab nursing? Yes 4. Does the patient require coordinated care of a physician, rehab nurse, PT (1-2 hrs/day, 5 days/week), OT (1-2 hrs/day, 5 days/week) and SLP (1-2 hrs/day, 5 days/week) to address physical and functional deficits in the context of the above medical diagnosis(es)? Yes Addressing deficits in the following  areas: balance, endurance, locomotion, strength, transferring, bowel/bladder control, bathing, dressing, feeding, grooming, toileting, cognition, speech, swallowing and psychosocial support 5. Can the patient actively participate in an intensive therapy program of at least 3 hrs of therapy per day at least 5 days per week? Yes 6. The potential for patient to make measurable gains while on inpatient rehab is excellent 7. Anticipated functional outcomes upon discharge from inpatient rehab are modified independent  with PT, modified independent with OT, modified independent with SLP. 8. Estimated rehab length of stay to reach the above functional goals is: 12-17 days 9. Anticipated D/C setting: Home 10. Anticipated post D/C treatments: HH therapy 11. Overall Rehab/Functional Prognosis: excellent  RECOMMENDATIONS: This patient's condition is appropriate for continued rehabilitative care in the following setting: CIR Patient has agreed to participate in recommended program. Yes Note that insurance prior authorization may be required for reimbursement for recommended  care.  Comment: Rehab Admissions Coordinator to follow up.  Thanks,  Ranelle Oyster, MD, [place] [facility]  I have personally performed a face to face diagnostic evaluation of this patient. Additionally, I have examined pertinent labs and radiographic images. I have reviewed and concur with the physician assistant's documentation above.    Mcarthur Rossetti Angiulli, PA-C 09/26/2018

## 2018-09-26 NOTE — Progress Notes (Addendum)
Inpatient Diabetes Program Recommendations  AACE/ADA: New Consensus Statement on Inpatient Glycemic Control   Target Ranges:  Prepandial:   less than 140 mg/dL      Peak postprandial:   less than 180 mg/dL (1-2 hours)      Critically ill patients:  140 - 180 mg/dL   Results for ZAKARIAS, SHIRAZI (MRN [id number]) as of 09/26/2018 09:51  Ref. Range 09/25/2018 03:25 09/25/2018 08:17 09/25/2018 12:43 09/25/2018 16:25 09/25/2018 19:41 09/25/2018 21:09 09/26/2018 07:37  Glucose-Capillary Latest Ref Range: 70 - 99 mg/dL 428 (H) 768 (H) 115 (H) 325 (H) 278 (H) 292 (H) 336 (H)   Review of Glycemic Control  Diabetes history: DM2 Outpatient Diabetes medications: Basaglar 40 units QHS, Humalog 15 units TID with meals, Metformin XR 1000 mg BID Current orders for Inpatient glycemic control: Levemir 25 units daily, Novolog 0-15 units TID with meals, Novolog 0-5 units QHS  Inpatient Diabetes Program Recommendations:   Insulin - Basal: Noted Levemir was increased from 15 to 25 units daily (started today).  Insulin - Meal Coverage: Please consider ordering Novolog 5 units TID with meals for meal coverage if patient eats at least 50% of meals.  HgbA1C: A1C 11.4% on 09/19/18 indicating an average glucose of 280 mg/dl over the past 2-3 months.  Addendum 09/26/18@10 :35-Spoke with patient about diabetes and home regimen for diabetes control. Patient reports that he is followed by PCP for diabetes management and currently he takes Basaglar 40 units QHS, Humalog 15 units TID with meals, and Metformin XR 1000 mg BID as an outpatient for diabetes control. Patient reports that he is taking DM medications as prescribed.   Patient states that he checks his glucose at home and it is usually in the 300's mg/dl.  Inquired about prior A1C and patient reports that he does not recall his last A1C value. Discussed A1C results (11.4% on 09/19/18) and explained that his current A1C indicates an average glucose of 280 mg/dl over the past 2-3 months.  Discussed glucose and A1C goals. Discussed importance of checking CBGs and maintaining good CBG control to prevent long-term and short-term complications. Explained how hyperglycemia leads to damage within blood vessels which lead to the common complications seen with uncontrolled diabetes. Stressed to the patient the importance of improving glycemic control to prevent further complications from uncontrolled diabetes. Discussed impact of nutrition, exercise, stress, sickness, and medications on diabetes control. Encouraged patient to ask PCP about referring him to an Endocrinologist. Patient lives in [facility] [place] [facility] so informed patient that Dr. Fransico Him is an Actor in [place]. Patient verbalized understanding of information discussed and he states that he has no further questions at this time related to diabetes.  Thanks, Orlando Penner, RN, MSN, CDE Diabetes Coordinator Inpatient Diabetes Program [phone] (Team Pager from 8am to 5pm)

## 2018-09-26 NOTE — Progress Notes (Signed)
Addendum  I was paged by RN earlier this afternoon stating that patient was agitated, hitting staff, pulling off equipment and being verbally and physically abusive. Wrist restraints were ordered.  I came by couple of hours later to review. Patient is calmly sleeping in bed. Oriented. Denies complaints and denies doing all indicated above.   Discussed with RN at bedside, reportedly has above behavior intermittently and possibly only to certain people.   Monitor closely. Will avoid chemical restraints unless absolutely needed. Being transferred to Medical bed.  Marcellus Scott, MD, FACP, [facility]. [facility]  To contact the attending provider between 7A-7P or the covering provider during after hours 7P-7A, please log into the web site [email or web address] and access using universal [place] password for that web site. If you do not have the password, please call the hospital operator.

## 2018-09-26 NOTE — Progress Notes (Signed)
Patient vomit 25 mL yellow emesis at 2300 during medication administration; Trazodone and propanolol not given due to nausea and vomiting (MD Bodenheimer made aware and AM propanolol time changed). Patient provided emesis bag and basis and gown changed.   Patient incontinent with 2 loose stool occurrences overnight. Patient care completed with each occurrence.   Patient indicated has had difficulty ambulating over the last several months to one year stating he doesn't get around too good when asked by this RN. Patient indicated he does a lot of sitting. Patient stated does not use an assistive device at baseline. Patient stated he is incontinent of stool and does not know/feel when it is coming.   Patient stated peeling on his hands and lips occurs periodically with no known cause.

## 2018-09-26 NOTE — Progress Notes (Signed)
PROGRESS NOTE   Omar Wood  JIR:[id number]    DOB: [birth year]    DOA: 09/17/2018  PCP: System, Pcp Not In   I have briefly reviewed patients previous medical records in [facility].  Brief Narrative:  58 year old male, PMH of DM 2, HTN, HLD, anxiety disorder/bipolar disorder, tobacco abuse, polysubstance abuse, presented to [facility] ED on 2/26 unresponsive with assisted ventilation by bag valve mask and was intubated upon arrival.  Initial work-up: UDS negative, lipase 1371, glucose 942, BUN 71, creatinine 3.88, sodium 114, potassium 5.5, chloride 67, BAL <10, WBC 27, urine microscopy positive for ketones.  He was hypotensive on pressors.  He was transferred to [facility] ICU under CCM care for further management.  He was admitted for DKA, acute metabolic encephalopathy, septic shock from pneumonia and acute hypoxic respiratory failure.  After improvement and stabilization, care transferred to [facility] on 3/5.   Assessment & Plan:   Active Problems:   DKA (diabetic ketoacidoses) (HCC)   Shock (HCC)   1. Septic shock due to group B strep and Klebsiella pneumonia: Required pressors early on in admission.  Completed 7 days course of antibiotics. 2. Acute renal failure due to ATN: Nonoliguric.  Nephrology follow-up appreciated.  Creatinine continues to improve, down to 1.73 today.  Avoid nephrotoxic's.  Continue to monitor BMP daily.  I discussed with Dr. Ronalee Belts, no additional input and signed off today. 3. Hypernatremia: Likely due to post ATN diuresis.  Sodium peaked to 152.  Briefly treated with IV D5W while n.p.o.  After diet started, placed on free water.  D5 infusion discontinued due to worsening hyperglycemia.  Hyponatremia slowly improving, 148 today.  Continue free water and follow BMP in a.m. 4. DM2 not at goal/DKA: Patient was on insulins PTA and claims compliance.  However A1c 2/28: 11.4 indicates very poor outpatient control.  Increased Levemir to 25 units daily, continue NovoLog  SSI.  Monitor CBGs closely and uptitrate insulins as needed.  DM coordinator input appreciated.  DKA resolved.  Metformin held. 5. Essential hypertension: Blood pressure starting to creep up.  Continue to hold lisinopril but resumed Inderal today. 6. Hyperlipidemia: Hold statins due to abnormal LFTs. 7. Abnormal LFTs: In the setting of septic shock.  AST and ALT peaked in the 781-815 range.  Continue to improve.  Follow LFTs daily.  RUQ ultrasound unremarkable. 8. Anxiety disorder/bipolar disorder: Continue Cymbalta, Seroquel and trazodone as per prior home dose.  Delirium precautions.  Check EKG to monitor QTC. 9. Dysphagia: Speech therapy evaluated 3/5 and recommend dysphagia 2 diet and thin liquids.  Tolerating diet but has odynophagia, see discussion below. 10. Oral/suspected esophageal candidiasis: Continue PRN Magic mouthwash.  Started Diflucan per pharmacy. 11. Normocytic anemia: Stable. 12. Thrombocytopenia: Transient and likely related to acute illness/infectious etiology.  Resolved. 13. Leukocytosis: Possibly stress response.  Completed antibiotic course for pneumonia.  Stable.  Resolved. 14. Hypokalemia: Replaced.  Magnesium normal. 15. Deconditioning: Mobilize, PT and OT evaluation appreciated, recommended CIR, consulted. 16. Rhabdomyolysis: Resolved.  This may also be a reason for abnormal LFTs. 17. Tobacco/polysubstance abuse: UDS negative. 18. Scrotal/groin fungal appearing rash: Topical nystatin cream.  Now Diflucan being started which should help. 19. Acute encephalopathy: Present on admission.  Likely related to DKA, septic shock, pneumonia and acute kidney injury.  Improved or even resolved. 20. Deconditioning: CIR consulted.   DVT prophylaxis: Subcutaneous heparin Code Status: Full Family Communication: None at bedside Disposition: Admitted to [facility] ICU.  Awaiting transfer to medical telemetry since 3/5 pending  bed availability.   Consultants:  CCM-signed off  3/4. Nephrology  Procedures:  ETT 2/26 >>  Lt IJ 2/26 >>  3/5 Lt radial A line 2/26 >>  Antimicrobials:  Rocephin 2/26 >> 3/3 Zithromax 2/26 >> 2/27 Cefepime 2/28 >> 2/29   Subjective: Tolerating diet but complains of painful midsternal area swallowing to both solids and liquids ongoing for the last 3 to 4 weeks.  No other complaints reported.  As per RN, no acute issues noted.  ROS: As above, otherwise negative  Objective:  Vitals:   09/26/18 0400 09/26/18 0500 09/26/18 0600 09/26/18 0737  BP: (!) 148/69  (!) 153/79   Pulse: (!) 104 (!) 106 (!) 110   Resp: (!) 24 (!) 27 20   Temp:    98.9 F (37.2 C)  TempSrc:    Oral  SpO2: 98% 98% 98%   Weight:      Height:        Examination:  General exam: Pleasant middle-age male, moderately built and nourished lying comfortably propped up in bed.  Does not appear in any distress. Oral cavity: Hygiene better today.  Lips dry.  Poor dental hygiene, missing multiple teeth.  1 or 2 specks of oral thrush noted in posterior pharyngeal wall.  No acute findings noted. Respiratory system: Improved breath sounds.  Clear to auscultation anteriorly.  Slightly harsh posteriorly.  No rhonchi or wheezing.  Occasional basal crackles.  No increased work of breathing. Cardiovascular system: S1 & S2 heard, RRR. No JVD, murmurs, rubs, gallops or clicks. No pedal edema.  Telemetry personally reviewed: Sinus tachycardia in the 100s.  Stable without change. Gastrointestinal system: Abdomen is nondistended, soft and nontender. No organomegaly or masses felt. Normal bowel sounds heard.  Stable. Central nervous system: Alert and oriented x3. No focal neurological deficits. Extremities: Symmetric 5 x 5 power. Skin: Fungal appearing rash with mild excoriation of scrotum and right side of groin. Psychiatry: Judgement and insight appear normal. Mood & affect appropriate.     Data Reviewed: I have personally reviewed following labs and imaging  studies  CBC: Recent Labs  Lab 09/22/18 0409 09/23/18 0514 09/24/18 0306 09/25/18 0520 09/26/18 0425  WBC 8.3 12.5* 14.8* 15.3* 10.5  HGB 9.4* 9.8* 9.5* 9.5* 9.4*  HCT 26.8* 29.6* 29.1* 29.8* 30.7*  MCV 83.2 87.1 90.7 92.8 94.2  PLT PLATELET CLUMPS NOTED ON SMEAR, COUNT APPEARS DECREASED 173 233 377 365   Basic Metabolic Panel: Recent Labs  Lab 09/22/18 1800 09/23/18 0514 09/23/18 1941 09/24/18 0306 09/24/18 1651 09/25/18 0520 09/26/18 0425  NA 142 142  --  150* 151* 152* 148*  K 3.0* 3.0* 3.6 3.4* 4.4 3.4* 4.0  CL 100 102  --  108 107 111 108  CO2 34* 32  --  33* GLUCOSE 139* 157*  --  136* 268* 259* 372*  BUN 89* 83*  --  63* 56* 55* 41*  CREATININE 3.73* 3.38*  --  2.55* 2.52* 2.19* 1.73*  CALCIUM 6.4* 6.6*  --  6.8* 6.7* 7.0* 7.3*  MG 1.5*  --   --  2.0  --   --   --   PHOS 1.6*  --   --  2.5  --   --   --    Liver Function Tests: Recent Labs  Lab 09/22/18 0409 09/23/18 0514 09/24/18 0306 09/25/18 0520 09/26/18 0425  AST 781* 543* 397* 136* 55*  ALT 815* 654* 509* 314* 222*  ALKPHOS 179* 253* 275* 220*  192*  BILITOT 0.7 0.6 0.8 1.1 0.5  PROT 4.3* 4.4* 4.6* 4.7* 5.0*  ALBUMIN 1.2* 1.3* 1.3* 1.4* 1.6*    Cardiac Enzymes: Recent Labs  Lab 09/20/18 0250 09/25/18 0520  CKTOTAL 655* 60   CBG: Recent Labs  Lab 09/25/18 1243 09/25/18 1625 09/25/18 1941 09/25/18 2109 09/26/18 0737  GLUCAP 281* 325* 278* 292* 336*    Recent Results (from the past 240 hour(s))  MRSA PCR Screening     Status: None   Collection Time: 09/17/18  3:30 PM  Result Value Ref Range Status   MRSA by PCR NEGATIVE NEGATIVE Final    Comment:        The GeneXpert MRSA Assay (FDA approved for NASAL specimens only), is one component of a comprehensive MRSA colonization surveillance program. It is not intended to diagnose MRSA infection nor to guide or monitor treatment for MRSA infections. Performed at [facility], [address], [place], [place]  [address]   Culture, blood (Routine X 2) w Reflex to ID Panel     Status: None   Collection Time: 09/17/18  6:49 PM  Result Value Ref Range Status   Specimen Description BLOOD RIGHT HAND  Final   Special Requests   Final    BOTTLES DRAWN AEROBIC ONLY Blood Culture results may not be optimal due to an inadequate volume of blood received in culture bottles Performed at [facility], [address], [place], [place] [address]    Culture NO GROWTH 5 DAYS  Final   Report Status 09/22/2018 FINAL  Final  Culture, blood (Routine X 2) w Reflex to ID Panel     Status: None   Collection Time: 09/17/18  6:54 PM  Result Value Ref Range Status   Specimen Description BLOOD LEFT HAND  Final   Special Requests   Final    BOTTLES DRAWN AEROBIC ONLY Blood Culture results may not be optimal due to an inadequate volume of blood received in culture bottles Performed at [facility], [address] [place], [place] [address]    Culture NO GROWTH 5 DAYS  Final   Report Status 09/22/2018 FINAL  Final  Culture, Urine     Status: None   Collection Time: 09/17/18  8:36 PM  Result Value Ref Range Status   Specimen Description URINE, RANDOM  Final   Special Requests NONE  Final   Culture   Final    NO GROWTH Performed at [facility], [address], [place], [place] [address]    Report Status 09/19/2018 FINAL  Final  Culture, respiratory (non-expectorated)     Status: None   Collection Time: 09/17/18 10:32 PM  Result Value Ref Range Status   Specimen Description TRACHEAL ASPIRATE  Final   Special Requests NONE  Final   Gram Stain   Final    FEW WBC PRESENT, PREDOMINANTLY PMN ABUNDANT GRAM POSITIVE COCCI RARE GRAM POSITIVE RODS RARE YEAST    Culture   Final    FEW KLEBSIELLA PNEUMONIAE MODERATE GROUP B STREP(S.AGALACTIAE)ISOLATED TESTING AGAINST S. AGALACTIAE NOT ROUTINELY PERFORMED DUE TO PREDICTABILITY OF AMP/PEN/VAN SUSCEPTIBILITY. Performed at [facility], [address], [place], [place] [address]    Report Status 09/20/2018 FINAL  Final   Organism ID, Bacteria KLEBSIELLA PNEUMONIAE  Final      Susceptibility   Klebsiella pneumoniae - MIC*    AMPICILLIN RESISTANT Resistant     CEFAZOLIN <=4 SENSITIVE Sensitive  CEFEPIME <=1 SENSITIVE Sensitive     CEFTAZIDIME <=1 SENSITIVE Sensitive     CEFTRIAXONE <=1 SENSITIVE Sensitive     CIPROFLOXACIN <=0.25 SENSITIVE Sensitive     GENTAMICIN <=1 SENSITIVE Sensitive     IMIPENEM <=0.25 SENSITIVE Sensitive     TRIMETH/SULFA <=20 SENSITIVE Sensitive     AMPICILLIN/SULBACTAM 4 SENSITIVE Sensitive     PIP/TAZO <=4 SENSITIVE Sensitive     Extended ESBL NEGATIVE Sensitive     * FEW KLEBSIELLA PNEUMONIAE         Radiology Studies: No results found.      Scheduled Meds: . DULoxetine  60 mg Oral BID  . feeding supplement (ENSURE ENLIVE)  237 mL Oral TID BM  . free water  100 mL Oral Q4H  . heparin  5,000 Units Subcutaneous Q8H  . insulin aspart  0-15 Units Subcutaneous TID WC  . insulin aspart  0-5 Units Subcutaneous QHS  . insulin detemir  25 Units Subcutaneous Daily  . mouth rinse  15 mL Mouth Rinse q12n4p  . nystatin cream   Topical BID  . pantoprazole sodium  40 mg Oral Daily  . propranolol  10 mg Oral TID  . QUEtiapine  100 mg Oral QHS  . traZODone  50 mg Oral QHS   Continuous Infusions:    LOS: 9 days     Marcellus ScottAnand Kendyl Festa, MD, FACP, [facility]. [facility]  To contact the attending provider between 7A-7P or the covering provider during after hours 7P-7A, please log into the web site [email or web address] and access using universal [place] password for that web site. If you do not have the password, please call the hospital operator.  09/26/2018, 8:49 AM

## 2018-09-27 LAB — COMPREHENSIVE METABOLIC PANEL
ALT: 148 U/L — ABNORMAL HIGH (ref 0–44)
AST: 39 U/L (ref 15–41)
Albumin: 1.6 g/dL — ABNORMAL LOW (ref 3.5–5.0)
Alkaline Phosphatase: 153 U/L — ABNORMAL HIGH (ref 38–126)
Anion gap: 12 (ref 5–15)
BILIRUBIN TOTAL: 1 mg/dL (ref 0.3–1.2)
BUN: 32 mg/dL — ABNORMAL HIGH (ref 6–20)
CO2: 28 mmol/L (ref 22–32)
Calcium: 7.3 mg/dL — ABNORMAL LOW (ref 8.9–10.3)
Chloride: 110 mmol/L (ref 98–111)
Creatinine, Ser: 1.49 mg/dL — ABNORMAL HIGH (ref 0.61–1.24)
GFR calc Af Amer: 60 mL/min — ABNORMAL LOW (ref >60)
GFR calc non Af Amer: 51 mL/min — ABNORMAL LOW (ref >60)
Glucose, Bld: 179 mg/dL — ABNORMAL HIGH (ref 70–99)
Potassium: 3.6 mmol/L (ref 3.5–5.1)
Sodium: 150 mmol/L — ABNORMAL HIGH (ref 135–145)
Total Protein: 5.1 g/dL — ABNORMAL LOW (ref 6.5–8.1)

## 2018-09-27 LAB — GLUCOSE, CAPILLARY
Glucose-Capillary: 247 mg/dL — ABNORMAL HIGH (ref 70–99)
Glucose-Capillary: 208 mg/dL — ABNORMAL HIGH (ref 70–99)
Glucose-Capillary: 158 mg/dL — ABNORMAL HIGH (ref 70–99)
Glucose-Capillary: 142 mg/dL — ABNORMAL HIGH (ref 70–99)

## 2018-09-27 MED ORDER — INSULIN DETEMIR 100 UNIT/ML ~~LOC~~ SOLN
30.0000 [IU] | Freq: Every day | SUBCUTANEOUS | Status: DC
  Administered 2018-09-28 – 2018-09-30 (×3): 30 [IU] via SUBCUTANEOUS
  Filled 2018-09-27 (×3): qty 0.3

## 2018-09-27 MED ORDER — PROPRANOLOL HCL 10 MG PO TABS
10.0000 mg | ORAL_TABLET | Freq: Three times a day (TID) | ORAL | Status: DC
  Administered 2018-09-27 – 2018-10-04 (×6): 10 mg via ORAL
  Filled 2018-09-27 (×23): qty 1

## 2018-09-27 NOTE — Progress Notes (Signed)
PROGRESS NOTE   Omar Wood  BMW:[id number]    DOB: 02-05-1961    DOA: 09/17/2018  PCP: System, Pcp Not In   I have briefly reviewed patients previous medical records in [facility]  Brief Narrative:  58 year old male, PMH of DM 2, HTN, HLD, anxiety disorder/bipolar disorder, tobacco abuse, polysubstance abuse, presented to [facility] ED on 2/26 unresponsive with assisted ventilation by bag valve mask and was intubated upon arrival.  Initial work-up: UDS negative, lipase 1371, glucose 942, BUN 71, creatinine 3.88, sodium 114, potassium 5.5, chloride 67, BAL <10, WBC 27, urine microscopy positive for ketones.  He was hypotensive on pressors.  He was transferred to [facility] ICU under CCM care for further management.  He was admitted for DKA, acute metabolic encephalopathy, septic shock from pneumonia and acute hypoxic respiratory failure.  After improvement and stabilization, care transferred to [facility] on 3/5.   Assessment & Plan:   Active Problems:   DKA (diabetic ketoacidoses) (HCC)   Shock (HCC)   1. Septic shock due to group B strep and Klebsiella pneumonia: Required pressors early on in admission.  Completed 7 days course of antibiotics. 2. Acute renal failure due to ATN: Nonoliguric.  Nephrology follow-up appreciated.  Creatinine continues to improve, down to 1.49 today.  Avoid nephrotoxic's.  Continue to monitor BMP daily.  Nephrology signed off 3/6. 3. Hypernatremia: Likely due to post ATN diuresis.  Sodium peaked to 152.  Briefly treated with IV D5W while n.p.o.  After diet started, placed on free water.  D5 infusion discontinued due to worsening hyperglycemia.  Sodium is again gone up from 148-1 52.  Although he is thirsty, not sure able to drink adequately due to nausea and?  Esophageal pain.  Continue to monitor with possible oral intake today but if worsening, may have to start IV hypotonic fluids again. 4. DM2 not at goal/DKA: Patient was on insulins PTA and claims compliance.   However A1c 2/28: 11.4 indicates very poor outpatient control.  Increased Levemir to 30 units daily (home dose is 40 units), continue NovoLog SSI.  Monitor CBGs closely and uptitrate insulins as needed.  DM coordinator input appreciated.  DKA resolved.  Metformin held.  Hesitant to add mealtime NovoLog due to inconsistent oral intake. 5. Essential hypertension: Blood pressure starting to creep up.  Continue to hold lisinopril.  Continue prior home dose of Inderal. 6. Hyperlipidemia: Hold statins due to abnormal LFTs. 7. Abnormal LFTs: In the setting of septic shock.  AST and ALT peaked in the 781-815 range.  Continue to improve.  Follow LFTs daily.  RUQ ultrasound unremarkable. 8. Anxiety disorder/bipolar disorder: Continue Cymbalta, Seroquel and trazodone as per prior home dose.  Delirium precautions.  EKG 3/6: QTC 472 ms. 9. Dysphagia: Speech therapy evaluated 3/5 and recommend dysphagia 2 diet and thin liquids.  Tolerating diet but has odynophagia, see discussion below. 10. Oral/suspected esophageal candidiasis: Continue PRN Magic mouthwash.  Started Diflucan per pharmacy.  If does not improve in the next day or 2, may need to consider GI consultation for possible EGD. 11. Normocytic anemia: Stable. 12. Thrombocytopenia: Transient and likely related to acute illness/infectious etiology.  Resolved. 13. Leukocytosis: Possibly stress response.  Completed antibiotic course for pneumonia.  Stable.  Resolved. 14. Hypokalemia: Replaced.  Magnesium normal. 15. Deconditioning: Mobilize, PT and OT evaluation appreciated, recommended CIR, consulted. 16. Rhabdomyolysis: Resolved.  This may also be a reason for abnormal LFTs. 17. Tobacco/polysubstance abuse: UDS negative. 18. Scrotal/groin fungal appearing rash: Topical nystatin cream.  Now Diflucan being started which should help.  Much improved. 19. Acute encephalopathy: Present on admission.  Likely related to DKA, septic shock, pneumonia and acute kidney  injury.  This had improved.  On 3/6 however had episode of agitation, hitting staff, pulling of equipment, being verbally and physically abusive and briefly on restraints.?  Related to his bipolar disorder.  States that he does this even at home.  This is improved.  Off of restraints today.  Monitor closely. 20. Deconditioning: CIR consulted. 21. Acute respiratory failure with hypoxia: Still requiring oxygen.  Does not appear in any distress.  Wean oxygen as tolerated.  Incentive spirometry.  Follow chest x-ray periodically.   DVT prophylaxis: Subcutaneous heparin Code Status: Full Family Communication: None at bedside Disposition: Admitted to [facility] ICU.  Transferred to medical bed 3/6.  Consultants:  CCM-signed off 3/4. Nephrology  Procedures:  ETT 2/26 >>  Lt IJ 2/26 >>  3/5 Lt radial A line 2/26 >>  Antimicrobials:  Rocephin 2/26 >> 3/3 Zithromax 2/26 >> 2/27 Cefepime 2/28 >> 2/29   Subjective: This morning denied mouth or pain on swallowing.  Reports feeling thirsty and drinking water.  As per RN, calm and cooperative and not agitated, restraints removed this morning.  ROS: As above, otherwise negative  Objective:  Vitals:   09/26/18 1951 09/27/18 0408 09/27/18 1143 09/27/18 1230  BP: 118/79 (!) 153/99 (!) 160/82 (!) 154/83  Pulse: 66 98 (!) 102 (!) 104  Resp: 18 18  18   Temp: 97.7 F (36.5 C) 97.7 F (36.5 C) 98.7 F (37.1 C)   TempSrc:   Oral   SpO2: 98% 97% (!) 81% 98%  Weight:      Height:        Examination:  General exam: Pleasant middle-age male, moderately built and nourished lying comfortably propped up in bed.  Does not appear in any distress. Oral cavity: Lips dry with crusts peeling off.  Poor dental hygiene, missing multiple teeth.  No oral thrush.  No acute findings noted. Respiratory system: Slightly diminished breath sounds in the bases with occasional basal crackles, otherwise clear to auscultation.  No increased work of breathing. Cardiovascular  system: S1 & S2 heard, RRR. No JVD, murmurs, rubs, gallops or clicks. No pedal edema.  Stable Gastrointestinal system: Abdomen is nondistended, soft and nontender. No organomegaly or masses felt. Normal bowel sounds heard.  Stable Central nervous system: Alert and oriented x3. No focal neurological deficits.  Stable Extremities: Symmetric 5 x 5 power. Skin: Fungal appearing rash with mild excoriation of scrotum and right side of groin.  Much improved. Psychiatry: Judgement and insight appear impaired. Mood & affect appropriate.     Data Reviewed: I have personally reviewed following labs and imaging studies  CBC: Recent Labs  Lab 09/22/18 0409 09/23/18 0514 09/24/18 0306 09/25/18 0520 09/26/18 0425  WBC 8.3 12.5* 14.8* 15.3* 10.5  HGB 9.4* 9.8* 9.5* 9.5* 9.4*  HCT 26.8* 29.6* 29.1* 29.8* 30.7*  MCV 83.2 87.1 90.7 92.8 94.2  PLT PLATELET CLUMPS NOTED ON SMEAR, COUNT APPEARS DECREASED 173 233 377 365   Basic Metabolic Panel: Recent Labs  Lab 09/22/18 1800  09/24/18 0306 09/24/18 1651 09/25/18 0520 09/26/18 0425 09/27/18 0249  NA 142   < > 150* 151* 152* 148* 150*  K 3.0*   < > 3.4* 4.4 3.4* 4.0 3.6  CL 100   < > 108 107 111 108 110  CO2 34*   < > 33* 27 29 27 28   GLUCOSE  139*   < > 136* 268* 259* 372* 179*  BUN 89*   < > 63* 56* 55* 41* 32*  CREATININE 3.73*   < > 2.55* 2.52* 2.19* 1.73* 1.49*  CALCIUM 6.4*   < > 6.8* 6.7* 7.0* 7.3* 7.3*  MG 1.5*  --  2.0  --   --   --   --   PHOS 1.6*  --  2.5  --   --   --   --    < > = values in this interval not displayed.   Liver Function Tests: Recent Labs  Lab 09/23/18 0514 09/24/18 0306 09/25/18 0520 09/26/18 0425 09/27/18 0249  AST 543* 397* 136* 55* 39  ALT 654* 509* 314* 222* 148*  ALKPHOS 253* 275* 220* 192* 153*  BILITOT 0.6 0.8 1.1 0.5 1.0  PROT 4.4* 4.6* 4.7* 5.0* 5.1*  ALBUMIN 1.3* 1.3* 1.4* 1.6* 1.6*    Cardiac Enzymes: Recent Labs  Lab 09/25/18 0520  CKTOTAL 60   CBG: Recent Labs  Lab  09/26/18 1717 09/26/18 2303 09/27/18 0803 09/27/18 1229 09/27/18 1656  GLUCAP 118* 83 247* 208* 158*    Recent Results (from the past 240 hour(s))  Culture, blood (Routine X 2) w Reflex to ID Panel     Status: None   Collection Time: 09/17/18  6:49 PM  Result Value Ref Range Status   Specimen Description BLOOD RIGHT HAND  Final   Special Requests   Final    BOTTLES DRAWN AEROBIC ONLY Blood Culture results may not be optimal due to an inadequate volume of blood received in culture bottles Performed at [facility] Lab, [address] [place], [place] [address]    Culture NO GROWTH 5 DAYS  Final   Report Status 09/22/2018 FINAL  Final  Culture, blood (Routine X 2) w Reflex to ID Panel     Status: None   Collection Time: 09/17/18  6:54 PM  Result Value Ref Range Status   Specimen Description BLOOD LEFT HAND  Final   Special Requests   Final    BOTTLES DRAWN AEROBIC ONLY Blood Culture results may not be optimal due to an inadequate volume of blood received in culture bottles Performed at [facility], [address] [place], [place] [address]    Culture NO GROWTH 5 DAYS  Final   Report Status 09/22/2018 FINAL  Final  Culture, Urine     Status: None   Collection Time: 09/17/18  8:36 PM  Result Value Ref Range Status   Specimen Description URINE, RANDOM  Final   Special Requests NONE  Final   Culture   Final    NO GROWTH Performed at [facility], [address] [place], [place] [address]    Report Status 09/19/2018 FINAL  Final  Culture, respiratory (non-expectorated)     Status: None   Collection Time: 09/17/18 10:32 PM  Result Value Ref Range Status   Specimen Description TRACHEAL ASPIRATE  Final   Special Requests NONE  Final   Gram Stain   Final    FEW WBC PRESENT, PREDOMINANTLY PMN ABUNDANT GRAM POSITIVE COCCI RARE GRAM POSITIVE RODS RARE YEAST    Culture   Final    FEW KLEBSIELLA PNEUMONIAE MODERATE GROUP B STREP(S.AGALACTIAE)ISOLATED TESTING  AGAINST S. AGALACTIAE NOT ROUTINELY PERFORMED DUE TO PREDICTABILITY OF AMP/PEN/VAN SUSCEPTIBILITY. Performed at [facility], [address], [place], [place] [address]    Report Status 09/20/2018 FINAL  Final  Organism ID, Bacteria KLEBSIELLA PNEUMONIAE  Final      Susceptibility   Klebsiella pneumoniae - MIC*    AMPICILLIN RESISTANT Resistant     CEFAZOLIN <=4 SENSITIVE Sensitive     CEFEPIME <=1 SENSITIVE Sensitive     CEFTAZIDIME <=1 SENSITIVE Sensitive     CEFTRIAXONE <=1 SENSITIVE Sensitive     CIPROFLOXACIN <=0.25 SENSITIVE Sensitive     GENTAMICIN <=1 SENSITIVE Sensitive     IMIPENEM <=0.25 SENSITIVE Sensitive     TRIMETH/SULFA <=20 SENSITIVE Sensitive     AMPICILLIN/SULBACTAM 4 SENSITIVE Sensitive     PIP/TAZO <=4 SENSITIVE Sensitive     Extended ESBL NEGATIVE Sensitive     * FEW KLEBSIELLA PNEUMONIAE         Radiology Studies: No results found.      Scheduled Meds: . DULoxetine  60 mg Oral BID  . feeding supplement (ENSURE ENLIVE)  237 mL Oral TID BM  . fluconazole  200 mg Oral Daily  . free water  100 mL Oral Q4H  . heparin  5,000 Units Subcutaneous Q8H  . insulin aspart  0-15 Units Subcutaneous TID WC  . insulin aspart  0-5 Units Subcutaneous QHS  . insulin detemir  25 Units Subcutaneous Daily  . mouth rinse  15 mL Mouth Rinse q12n4p  . nystatin cream   Topical BID  . pantoprazole sodium  40 mg Oral Daily  . propranolol  10 mg Oral TID  . QUEtiapine  100 mg Oral QHS  . traZODone  50 mg Oral QHS   Continuous Infusions:    LOS: 10 days     Marcellus Scott, MD, FACP, [facility]. [facility]  To contact the attending provider between 7A-7P or the covering provider during after hours 7P-7A, please log into the web site [email or web address] and access using universal [place] password for that web site. If you do not have the password, please call the hospital operator.  09/27/2018, 5:41 PM

## 2018-09-27 NOTE — Progress Notes (Cosign Needed)
Patient still complains of nausea and generally not feeling well. Refused mouth care and hygiene because he did not feel well.  Attempted to wean patient from 3L to room air. Sats dropped to 81%. Reapplied nasal cannula and sats  98%. Restraints were removed in the AM and patient has been calm. Report off to RN caring for patient. Continue plan of care.

## 2018-09-28 ENCOUNTER — Inpatient Hospital Stay (HOSPITAL_COMMUNITY): Payer: Medicaid Other

## 2018-09-28 DIAGNOSIS — E1110 Type 2 diabetes mellitus with ketoacidosis without coma: Secondary | ICD-10-CM

## 2018-09-28 DIAGNOSIS — Z87891 Personal history of nicotine dependence: Secondary | ICD-10-CM

## 2018-09-28 DIAGNOSIS — J189 Pneumonia, unspecified organism: Secondary | ICD-10-CM

## 2018-09-28 DIAGNOSIS — K1370 Unspecified lesions of oral mucosa: Secondary | ICD-10-CM

## 2018-09-28 DIAGNOSIS — G9340 Encephalopathy, unspecified: Secondary | ICD-10-CM

## 2018-09-28 LAB — COMPREHENSIVE METABOLIC PANEL
ALT: 103 U/L — ABNORMAL HIGH (ref 0–44)
AST: 23 U/L (ref 15–41)
Albumin: 1.8 g/dL — ABNORMAL LOW (ref 3.5–5.0)
Alkaline Phosphatase: 132 U/L — ABNORMAL HIGH (ref 38–126)
Anion gap: 10 (ref 5–15)
BUN: 23 mg/dL — ABNORMAL HIGH (ref 6–20)
CALCIUM: 7.3 mg/dL — AB (ref 8.9–10.3)
CO2: 30 mmol/L (ref 22–32)
Chloride: 105 mmol/L (ref 98–111)
Creatinine, Ser: 1.23 mg/dL (ref 0.61–1.24)
GFR calc Af Amer: >60 mL/min (ref >60)
GFR calc non Af Amer: >60 mL/min (ref >60)
Glucose, Bld: 207 mg/dL — ABNORMAL HIGH (ref 70–99)
Potassium: 3.1 mmol/L — ABNORMAL LOW (ref 3.5–5.1)
Sodium: 145 mmol/L (ref 135–145)
Total Bilirubin: 1.2 mg/dL (ref 0.3–1.2)
Total Protein: 5.5 g/dL — ABNORMAL LOW (ref 6.5–8.1)

## 2018-09-28 LAB — GLUCOSE, CAPILLARY
Glucose-Capillary: 236 mg/dL — ABNORMAL HIGH (ref 70–99)
Glucose-Capillary: 150 mg/dL — ABNORMAL HIGH (ref 70–99)
Glucose-Capillary: 105 mg/dL — ABNORMAL HIGH (ref 70–99)

## 2018-09-28 LAB — MAGNESIUM: Magnesium: 1.4 mg/dL — ABNORMAL LOW (ref 1.7–2.4)

## 2018-09-28 MED ORDER — AMLODIPINE BESYLATE 5 MG PO TABS
5.0000 mg | ORAL_TABLET | Freq: Every day | ORAL | Status: DC
  Administered 2018-09-28 – 2018-10-04 (×2): 5 mg via ORAL
  Filled 2018-09-28 (×3): qty 1

## 2018-09-28 MED ORDER — POTASSIUM CHLORIDE CRYS ER 20 MEQ PO TBCR
40.0000 meq | EXTENDED_RELEASE_TABLET | ORAL | Status: DC
  Administered 2018-09-28: 40 meq via ORAL
  Filled 2018-09-28 (×2): qty 2

## 2018-09-28 MED ORDER — LORAZEPAM 2 MG/ML IJ SOLN: 1.0000 mg | Freq: Four times a day (QID) | INTRAMUSCULAR | Status: DC | PRN

## 2018-09-28 MED ORDER — ACYCLOVIR 5 % EX OINT
TOPICAL_OINTMENT | CUTANEOUS | Status: DC
  Administered 2018-09-28 (×3): via TOPICAL
  Filled 2018-09-28: qty 15

## 2018-09-28 MED ORDER — MAGNESIUM SULFATE 4 GM/100ML IV SOLN
4.0000 g | Freq: Once | INTRAVENOUS | Status: AC
  Administered 2018-09-28: 4 g via INTRAVENOUS
  Filled 2018-09-28: qty 100

## 2018-09-28 MED ORDER — POTASSIUM CHLORIDE 10 MEQ/100ML IV SOLN
10.0000 meq | INTRAVENOUS | Status: AC
  Administered 2018-09-28 – 2018-09-29 (×6): 10 meq via INTRAVENOUS
  Filled 2018-09-28 (×6): qty 100

## 2018-09-28 NOTE — Consult Note (Addendum)
[facility]       Reason for Consult: oral lesions    Referring Physician: Dr. Waymon Amato  Active Problems:   DKA (diabetic ketoacidoses) (HCC)   Shock (HCC)   . DULoxetine  60 mg Oral BID  . feeding supplement (ENSURE ENLIVE)  237 mL Oral TID BM  . fluconazole  200 mg Oral Daily  . free water  100 mL Oral Q4H  . heparin  5,000 Units Subcutaneous Q8H  . insulin aspart  0-15 Units Subcutaneous TID WC  . insulin aspart  0-5 Units Subcutaneous QHS  . insulin detemir  30 Units Subcutaneous Daily  . mouth rinse  15 mL Mouth Rinse q12n4p  . nystatin cream   Topical BID  . pantoprazole sodium  40 mg Oral Daily  . potassium chloride  40 mEq Oral Q4H  . propranolol  10 mg Oral TID  . QUEtiapine  100 mg Oral QHS  . traZODone  50 mg Oral QHS    Recommendations: Consider topical acyclovir  If lesions don't improve, he should see an oral surgeon and consider biopsy  Assessment: Oral lesions, unclear what they are.  Not typical for zoster.  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    Antibiotics: none  HPI: Omar Wood is a 58 y.o. male with DM poorly controlled with A1C 11.4 came in with shock, pneumonia, DKA.  Treated but has remained encephalopathic, though improved.  Has had persistent oral lesions as above.  No current fever.  States this have been present for weeks.  Not painful.     Review of Systems:  Constitutional: negative for fevers and chills Gastrointestinal: negative for diarrhea All other systems reviewed and are negative    Past Medical History:  Diagnosis Date  . Bipolar disorder (HCC)   . Diabetes mellitus without complication (HCC)   . Hypercholesteremia   . Hypertension   . Marijuana user   . Scrotal injury   . Tobacco abuse     Social History   Tobacco Use  . Smoking status: Former Smoker    Types: Cigarettes  . Smokeless tobacco: Never Used  Substance Use Topics  . Alcohol  use: No    Frequency: Never  . Drug use: No    FMH: + cardiac disease  No Known Allergies  Physical Exam: Constitutional: in no apparent distress  Vitals:   09/28/18 0508 09/28/18 0510  BP: (!) 167/87   Pulse: 93   Resp: 14   Temp: 98.7 F (37.1 C)   SpO2: (!) 84% 100%   EYES: anicteric ENMT: no thrush; oral lesions dry, crusted, on most of the bottom lip; tongue underneath with right side white patch at proximal portion Cardiovascular: Cor RRR Respiratory: CTA B; normal respiratory effort GI: soft Musculoskeletal: no edema Skin: no rash Hematologic: no cervical lad  Lab Results  Component Value Date   WBC 10.5 09/26/2018   HGB 9.4 (L) 09/26/2018   HCT 30.7 (L) 09/26/2018   MCV 94.2 09/26/2018   PLT 365 09/26/2018    Lab Results  Component Value Date   CREATININE 1.23 09/28/2018   BUN 23 (H) 09/28/2018   NA 145 09/28/2018   K 3.1 (L) 09/28/2018   CL 105 09/28/2018   CO2 30 09/28/2018    Lab Results  Component Value Date   ALT 103 (H) 09/28/2018   AST 23 09/28/2018   ALKPHOS 132 (H) 09/28/2018  Microbiology: No results found for this or any previous visit (from the past 240 hour(s)).  Gardiner Barefoot, MD [facility] [email or web address] 09/28/2018, 1:25 PM

## 2018-09-28 NOTE — Progress Notes (Signed)
PROGRESS NOTE   Omar Wood  SNK:[id number]    DOB: 1961-03-02    DOA: 09/17/2018  PCP: System, Pcp Not In   I have briefly reviewed patients previous medical records in [facility].  Brief Narrative:  58 year old male, PMH of DM 2, HTN, HLD, anxiety disorder/bipolar disorder, tobacco abuse, polysubstance abuse, presented to [facility] ED on 2/26 unresponsive with assisted ventilation by bag valve mask and was intubated upon arrival.  Initial work-up: UDS negative, lipase 1371, glucose 942, BUN 71, creatinine 3.88, sodium 114, potassium 5.5, chloride 67, BAL <10, WBC 27, urine microscopy positive for ketones.  He was hypotensive on pressors.  He was transferred to [facility] ICU under CCM care for further management.  He was admitted for DKA, acute metabolic encephalopathy, septic shock from pneumonia and acute hypoxic respiratory failure.  After improvement and stabilization, care transferred to [facility] on 3/5.   Assessment & Plan:   Active Problems:   DKA (diabetic ketoacidoses) (HCC)   Shock (HCC)   1. Septic shock due to group B strep and Klebsiella pneumonia: Required pressors early on in admission.  Completed 7 days course of antibiotics. 2. Acute renal failure due to ATN: Nonoliguric.  Nephrology consulted and signed off.  Acute kidney injury resolved/creatinine 1.23 today. 3. Hypernatremia: Likely due to post ATN diuresis.  Sodium peaked to 152.  Briefly treated with IV D5W while n.p.o.  After diet started, placed on free water.  D5 infusion discontinued due to worsening hyperglycemia.  Resolved/sodium 145 today.  Continue to encourage free water intake. 4. DM2 not at goal/DKA: Patient was on insulins PTA and claims compliance.  However A1c 2/28: 11.4 indicates very poor outpatient control.  Increased Levemir to 30 units daily today (home dose is 40 units), continue NovoLog SSI.  Monitor CBGs closely and uptitrate insulins as needed.  DM coordinator input appreciated.  DKA resolved.   Metformin held.  Hesitant to add mealtime NovoLog due to inconsistent oral intake. 5. Essential hypertension: Blood pressure starting to creep up.  Continue to hold lisinopril.  Continue prior home dose of Inderal.  Added amlodipine 5 mg daily. 6. Hyperlipidemia: Hold statins due to abnormal LFTs. 7. Abnormal LFTs: In the setting of septic shock.  AST and ALT peaked in the 781-815 range.  Continue to improve.  Follow LFTs daily.  RUQ ultrasound unremarkable. 8. Anxiety disorder/bipolar disorder: Continue Cymbalta, Seroquel and trazodone as per prior home dose.  Delirium precautions.  EKG 3/8: QTC 514 ms, see discussion below. 9. Dysphagia: Speech therapy evaluated 3/5 and recommend dysphagia 2 diet and thin liquids.  Tolerating diet but has odynophagia, see discussion below. 10. Oral/suspected esophageal candidiasis: Continue PRN Magic mouthwash.  Started Diflucan per pharmacy.  If does not improve in the next day or 2, may need to consider GI consultation for possible EGD.  Ongoing poor oral intake, no pain reported. 11. Lip/tongue lesions:Unclear etiology.  ID input appreciated.  Topical acyclovir to lip lesions-not typical for zoster,?  HSV.  White plaque on tongue?  Leukoplakia- if does not improve then outpatient follow-up with oral surgery for biopsy consideration. 12. Normocytic anemia: Stable. 13. Thrombocytopenia: Transient and likely related to acute illness/infectious etiology.  Resolved. 14. Leukocytosis: Possibly stress response.  Completed antibiotic course for pneumonia.  Stable.  Resolved. 15. Hypokalemia: Potassium 3.1, magnesium 1.4.  Replace aggressively and follow. 16. Deconditioning: Mobilize, PT and OT evaluation appreciated, recommended CIR, consulted. 17. Rhabdomyolysis: Resolved.  This may also be a reason for abnormal LFTs. 18.  Tobacco/polysubstance abuse: UDS negative. 19. Scrotal/groin fungal appearing rash: Topical nystatin cream.  Now Diflucan being started which should  help.  Much improved. 20. Acute encephalopathy: Present on admission.  Likely related to DKA, septic shock, pneumonia and acute kidney injury.  This had improved.  On 3/6 however had episode of agitation, hitting staff, pulling of equipment, being verbally and physically abusive and briefly on restraints.?  Related to his bipolar disorder.  States that he does this even at home.  No agitation for the last 2 days.  Mental status may be at baseline. 21. Acute respiratory failure with hypoxia: Still requiring oxygen.  Does not appear in any distress.  Wean oxygen as tolerated.  Incentive spirometry.  Chest x-ray personally reviewed: No obvious acute findings.   DVT prophylaxis: Subcutaneous heparin Code Status: Full Family Communication: None at bedside Disposition: Admitted to [facility] ICU.  Transferred to medical bed 3/6.  Possible CIR when clinically improved.  Consultants:  CCM-signed off 3/4. Nephrology ID  Procedures:  ETT 2/26 >>  Lt IJ 2/26 >>  3/5 Lt radial A line 2/26 >>  Antimicrobials:  Rocephin 2/26 >> 3/3 Zithromax 2/26 >> 2/27 Cefepime 2/28 >> 2/29   Subjective: Patient denies pain on eating or swallowing.  States that he has been drinking mostly and not eating due to lack of appetite.  As per RN, reports ongoing nausea, 2 episodes of nonbloody emesis yesterday which patient did not report and refusing oral potassium supplements.  No agitation.  ROS: As above, otherwise negative  Objective:  Vitals:   09/27/18 2253 09/28/18 0331 09/28/18 0508 09/28/18 0510  BP: (!) 145/103  (!) 167/87   Pulse: 69  93   Resp:   14   Temp:   98.7 F (37.1 C)   TempSrc:      SpO2:   (!) 84% 100%  Weight:  75.6 kg    Height:        Examination:  General exam: Pleasant middle-age male, moderately built and nourished lying comfortably propped up in bed.  Does not appear in any distress. Oral cavity: Lips dry with crusts peeling off-do not look different than yesterday.  Poor dental  hygiene, missing multiple teeth.  No oral thrush.  On the ventral aspect of the right side of tip of tongue noted approximately 0.5 cm diameter white patch. Respiratory system: Clear to auscultation.  No increased work of breathing. Cardiovascular system: S1 & S2 heard, RRR. No JVD, murmurs, rubs, gallops or clicks. No pedal edema.  Stable Gastrointestinal system: Abdomen is nondistended, soft and nontender. No organomegaly or masses felt. Normal bowel sounds heard.  Stable Central nervous system: Alert and oriented x2. No focal neurological deficits.  Stable Extremities: Symmetric 5 x 5 power. Skin: Fungal appearing rash with mild excoriation of scrotum and right side of groin.  Seems to have almost resolved Psychiatry: Judgement and insight impaired. Mood & affect appropriate.     Data Reviewed: I have personally reviewed following labs and imaging studies  CBC: Recent Labs  Lab 09/22/18 0409 09/23/18 0514 09/24/18 0306 09/25/18 0520 09/26/18 0425  WBC 8.3 12.5* 14.8* 15.3* 10.5  HGB 9.4* 9.8* 9.5* 9.5* 9.4*  HCT 26.8* 29.6* 29.1* 29.8* 30.7*  MCV 83.2 87.1 90.7 92.8 94.2  PLT PLATELET CLUMPS NOTED ON SMEAR, COUNT APPEARS DECREASED 173 233 377 365   Basic Metabolic Panel: Recent Labs  Lab 09/22/18 1800  09/24/18 0306 09/24/18 1651 09/25/18 0520 09/26/18 0425 09/27/18 0249 09/28/18 0546  NA 142   < >  150* 151* 152* 148* 150* 145  K 3.0*   < > 3.4* 4.4 3.4* 4.0 3.6 3.1*  CL 100   < > 108 107 111 108 110 105  CO2 34*   < > 33* 27 29 27 28 30   GLUCOSE 139*   < > 136* 268* 259* 372* 179* 207*  BUN 89*   < > 63* 56* 55* 41* 32* 23*  CREATININE 3.73*   < > 2.55* 2.52* 2.19* 1.73* 1.49* 1.23  CALCIUM 6.4*   < > 6.8* 6.7* 7.0* 7.3* 7.3* 7.3*  MG 1.5*  --  2.0  --   --   --   --  1.4*  PHOS 1.6*  --  2.5  --   --   --   --   --    < > = values in this interval not displayed.   Liver Function Tests: Recent Labs  Lab 09/24/18 0306 09/25/18 0520 09/26/18 0425 09/27/18 0249  09/28/18 0546  AST 397* 136* 55* 39 23  ALT 509* 314* 222* 148* 103*  ALKPHOS 275* 220* 192* 153* 132*  BILITOT 0.8 1.1 0.5 1.0 1.2  PROT 4.6* 4.7* 5.0* 5.1* 5.5*  ALBUMIN 1.3* 1.4* 1.6* 1.6* 1.8*    Cardiac Enzymes: Recent Labs  Lab 09/25/18 0520  CKTOTAL 60   CBG: Recent Labs  Lab 09/27/18 1229 09/27/18 1656 09/27/18 2211 09/28/18 0759 09/28/18 1242  GLUCAP 208* 158* 142* 236* 150*    No results found for this or any previous visit (from the past 240 hour(s)).       Radiology Studies: No results found.      Scheduled Meds: . DULoxetine  60 mg Oral BID  . feeding supplement (ENSURE ENLIVE)  237 mL Oral TID BM  . fluconazole  200 mg Oral Daily  . free water  100 mL Oral Q4H  . heparin  5,000 Units Subcutaneous Q8H  . insulin aspart  0-15 Units Subcutaneous TID WC  . insulin aspart  0-5 Units Subcutaneous QHS  . insulin detemir  30 Units Subcutaneous Daily  . mouth rinse  15 mL Mouth Rinse q12n4p  . nystatin cream   Topical BID  . pantoprazole sodium  40 mg Oral Daily  . propranolol  10 mg Oral TID  . QUEtiapine  100 mg Oral QHS  . traZODone  50 mg Oral QHS   Continuous Infusions:    LOS: 11 days     Marcellus Scott, MD, FACP, [facility]. [facility]  To contact the attending provider between 7A-7P or the covering provider during after hours 7P-7A, please log into the web site [email or web address] and access using universal [place] password for that web site. If you do not have the password, please call the hospital operator.  09/28/2018, 1:45 PM

## 2018-09-28 NOTE — Plan of Care (Signed)
  Problem: Education: Goal: Knowledge of General Education information will improve Description Including pain rating scale, medication(s)/side effects and non-pharmacologic comfort measures Outcome: Not Progressing   Problem: Health Behavior/Discharge Planning: Goal: Ability to manage health-related needs will improve Outcome: Not Progressing   Problem: Clinical Measurements: Goal: Ability to maintain clinical measurements within normal limits will improve Outcome: Not Progressing Goal: Will remain free from infection Outcome: Not Progressing Goal: Diagnostic test results will improve Outcome: Not Progressing Goal: Respiratory complications will improve Outcome: Not Progressing Goal: Cardiovascular complication will be avoided Outcome: Not Progressing   Problem: Activity: Goal: Risk for activity intolerance will decrease Outcome: Not Progressing   Problem: Nutrition: Goal: Adequate nutrition will be maintained Outcome: Not Progressing   Problem: Coping: Goal: Level of anxiety will decrease Outcome: Not Progressing   Problem: Elimination: Goal: Will not experience complications related to bowel motility Outcome: Not Progressing Goal: Will not experience complications related to urinary retention Outcome: Not Progressing   Problem: Pain Managment: Goal: General experience of comfort will improve Outcome: Not Progressing   Problem: Pain Managment: Goal: General experience of comfort will improve Outcome: Not Progressing   Problem: Safety: Goal: Ability to remain free from injury will improve Outcome: Not Progressing   Problem: Skin Integrity: Goal: Risk for impaired skin integrity will decrease Outcome: Not Progressing   Problem: Respiratory: Goal: Ability to maintain a clear airway and adequate ventilation will improve Outcome: Not Progressing   Problem: Role Relationship: Goal: Method of communication will improve Outcome: Not Progressing

## 2018-09-28 NOTE — Plan of Care (Signed)
  Problem: Health Behavior/Discharge Planning: Goal: Ability to manage health-related needs will improve Outcome: Progressing   Problem: Clinical Measurements: Goal: Ability to maintain clinical measurements within normal limits will improve Outcome: Progressing   Problem: Coping: Goal: Level of anxiety will decrease Outcome: Progressing   

## 2018-09-28 NOTE — Progress Notes (Signed)
Order received last PM for non-violent restraints, however patient was never placed in the restraints. Order discontinued. Renaldo Reel Katrinka Blazing, MSN RN

## 2018-09-29 ENCOUNTER — Inpatient Hospital Stay (HOSPITAL_COMMUNITY): Payer: Medicaid Other

## 2018-09-29 LAB — BASIC METABOLIC PANEL
Anion gap: 13 (ref 5–15)
BUN: 15 mg/dL (ref 6–20)
CO2: 20 mmol/L — ABNORMAL LOW (ref 22–32)
CREATININE: 0.99 mg/dL (ref 0.61–1.24)
Calcium: 6.8 mg/dL — ABNORMAL LOW (ref 8.9–10.3)
Chloride: 103 mmol/L (ref 98–111)
GFR calc Af Amer: >60 mL/min (ref >60)
GFR calc non Af Amer: >60 mL/min (ref >60)
Glucose, Bld: 165 mg/dL — ABNORMAL HIGH (ref 70–99)
Potassium: 6.1 mmol/L — ABNORMAL HIGH (ref 3.5–5.1)
Sodium: 136 mmol/L (ref 135–145)

## 2018-09-29 LAB — HEPATIC FUNCTION PANEL
ALT: UNDETERMINED U/L (ref 0–44)
AST: 73 U/L — ABNORMAL HIGH (ref 15–41)
Albumin: 1.8 g/dL — ABNORMAL LOW (ref 3.5–5.0)
Alkaline Phosphatase: 126 U/L (ref 38–126)
Bilirubin, Direct: UNDETERMINED mg/dL (ref 0.0–0.2)
Total Bilirubin: UNDETERMINED mg/dL (ref 0.3–1.2)
Total Protein: 5.8 g/dL — ABNORMAL LOW (ref 6.5–8.1)

## 2018-09-29 LAB — GLUCOSE, CAPILLARY
GLUCOSE-CAPILLARY: 254 mg/dL — AB (ref 70–99)
Glucose-Capillary: 171 mg/dL — ABNORMAL HIGH (ref 70–99)
Glucose-Capillary: 217 mg/dL — ABNORMAL HIGH (ref 70–99)
Glucose-Capillary: 344 mg/dL — ABNORMAL HIGH (ref 70–99)
Glucose-Capillary: 250 mg/dL — ABNORMAL HIGH (ref 70–99)

## 2018-09-29 LAB — BASIC METABOLIC PANEL WITH GFR
Anion gap: 14 (ref 5–15)
BUN: 15 mg/dL (ref 6–20)
CO2: 26 mmol/L (ref 22–32)
Calcium: 7.1 mg/dL — ABNORMAL LOW (ref 8.9–10.3)
Chloride: 99 mmol/L (ref 98–111)
Creatinine, Ser: 1.22 mg/dL (ref 0.61–1.24)
GFR calc Af Amer: >60 mL/min
GFR calc non Af Amer: >60 mL/min
Glucose, Bld: 264 mg/dL — ABNORMAL HIGH (ref 70–99)
Potassium: 3.8 mmol/L (ref 3.5–5.1)
Sodium: 139 mmol/L (ref 135–145)

## 2018-09-29 LAB — MAGNESIUM
Magnesium: UNDETERMINED mg/dL (ref 1.7–2.4)
Magnesium: 1.6 mg/dL — ABNORMAL LOW (ref 1.7–2.4)

## 2018-09-29 MED ORDER — ZOLPIDEM TARTRATE 5 MG PO TABS
5.0000 mg | ORAL_TABLET | Freq: Every evening | ORAL | Status: DC | PRN
  Administered 2018-09-29 – 2018-10-03 (×2): 5 mg via ORAL
  Filled 2018-09-29 (×3): qty 1

## 2018-09-29 MED ORDER — POTASSIUM CHLORIDE CRYS ER 20 MEQ PO TBCR
40.0000 meq | EXTENDED_RELEASE_TABLET | Freq: Once | ORAL | Status: DC
  Filled 2018-09-29: qty 2

## 2018-09-29 MED ORDER — MAGNESIUM SULFATE 4 GM/100ML IV SOLN
4.0000 g | Freq: Once | INTRAVENOUS | Status: DC
  Filled 2018-09-29: qty 100

## 2018-09-29 MED ORDER — FUROSEMIDE 10 MG/ML IJ SOLN
40.0000 mg | Freq: Once | INTRAMUSCULAR | Status: AC
  Administered 2018-09-29: 40 mg via INTRAVENOUS
  Filled 2018-09-29: qty 4

## 2018-09-29 MED ORDER — PANTOPRAZOLE SODIUM 40 MG IV SOLR
40.0000 mg | Freq: Two times a day (BID) | INTRAVENOUS | Status: DC
  Administered 2018-10-01: 40 mg via INTRAVENOUS
  Filled 2018-09-29 (×2): qty 40

## 2018-09-29 MED ORDER — NYSTATIN 100000 UNIT/ML MT SUSP
5.0000 mL | Freq: Four times a day (QID) | OROMUCOSAL | Status: DC
  Filled 2018-09-29 (×4): qty 5

## 2018-09-29 NOTE — Progress Notes (Signed)
Inpatient Rehabilitation-Admissions Coordinator   Note pt noncompliant with meds and having aggressive behavior with staff. Due to these behaviors and his reluctance to comply with recommendations, feel pt will need longer term rehab, such as a SNF at [place]. AC will sign off at this time. Will communicate with SW/CM need for new dispo plans at this time.   Nanine Means, OTR/L  Rehab Admissions Coordinator  [phone] 09/29/2018 11:59 AM

## 2018-09-29 NOTE — Progress Notes (Signed)
Pt still refusing assessment.  Pt asked for 2 chocolate milks.  I told him I could order them from the cafeteria.  He replied, "get me the fu__ing milk."  I left the room.

## 2018-09-29 NOTE — Progress Notes (Addendum)
PROGRESS NOTE   Omar Wood  EKB:[id number]    DOB: [birth year]    DOA: 09/17/2018  PCP: System, Pcp Not In   I have briefly reviewed patients previous medical records in [facility].  Brief Narrative:  58 year old male, PMH of DM 2, HTN, HLD, anxiety disorder/bipolar disorder, tobacco abuse, polysubstance abuse, presented to [facility] ED on 2/26 unresponsive with assisted ventilation by bag valve mask and was intubated upon arrival.  Initial work-up: UDS negative, lipase 1371, glucose 942, BUN 71, creatinine 3.88, sodium 114, potassium 5.5, chloride 67, BAL <10, WBC 27, urine microscopy positive for ketones.  He was hypotensive on pressors.  He was transferred to [facility] ICU under CCM care for further management.  He was admitted for DKA, acute metabolic encephalopathy, septic shock from pneumonia and acute hypoxic respiratory failure.  After improvement and stabilization, care transferred to [facility] on 3/5.  Hospital course complicated by poor oral intake secondary to odynophagia, intermittent agitation and refusal of medication/care.  ID and [facility] consulted for oral pain/lip ulcers and odynophagia.  EGD planned for 3/10.   Assessment & Plan:   Active Problems:   DKA (diabetic ketoacidoses) (HCC)   Shock (HCC)   1. Septic shock due to group B strep and Klebsiella pneumonia: Required pressors early on in admission.  Completed 7 days course of antibiotics. 2. Acute renal failure due to ATN: Nonoliguric.  Nephrology consulted and signed off.  Acute kidney injury resolved/creatinine 1.22 today. 3. Hypernatremia: Likely due to post ATN diuresis.  Sodium peaked to 152.  Treated with hypotonic IV fluids, oral free water intake.  Resolved. 4. DM2 not at goal/DKA: Patient was on insulins PTA and claims compliance.  However A1c 2/28: 11.4 indicates very poor outpatient control.  Increased Levemir to 30 units daily today (home dose is 40 units), continue NovoLog SSI.  Monitor CBGs closely and  uptitrate insulins as needed.  DM coordinator input appreciated.  DKA resolved.  Metformin held.  Hesitant to add mealtime NovoLog due to inconsistent oral intake.  Mildly uncontrolled and fluctuating. 5. Essential hypertension: Holding lisinopril.  Resumed home dose of Inderal 10 mg 3 times daily and added amlodipine 5 mg daily with better control. 6. Hyperlipidemia: Hold statins due to abnormal LFTs. 7. Abnormal LFTs: In the setting of septic shock.  AST and ALT peaked in the 781-815 range.  Continue to improve.  Follow LFTs daily.  RUQ ultrasound unremarkable. 8. Anxiety disorder/bipolar disorder: Continue Cymbalta, Seroquel and trazodone as per prior home dose.  Delirium precautions.  EKG 3/9: QTC 494 ms, see discussion below. 9. Dysphagia/odynophagia: Speech therapy evaluated 3/5 and recommend dysphagia 2 diet and thin liquids.  Not improving with fluconazole.  [facility] input appreciated, suspect infectious esophagitis (herpetic versus candidiasis).  EGD planned for 3/10. 10. Oral/suspected esophageal candidiasis: Continue PRN Magic mouthwash.  Started Diflucan per pharmacy-discontinued 3/9 due to lack of improvement and prolonged QTC.  Management as noted above. 11. Lip/tongue lesions:Unclear etiology.  ID input appreciated.  Topical acyclovir to lip lesions-not typical for zoster,?  HSV.  White plaque on tongue?  Leukoplakia- if does not improve then outpatient follow-up with oral surgery for biopsy consideration. 12. Normocytic anemia: Stable. 13. Thrombocytopenia: Transient and likely related to acute illness/infectious etiology.  Resolved. 14. Leukocytosis: Possibly stress response.  Completed antibiotic course for pneumonia.  Stable.  Resolved. 15. Hypokalemia: Potassium 3.8 and magnesium 1.6.  Replace as needed and follow. 16. Deconditioning: Mobilize, PT and OT evaluation appreciated.  CIR declined  due to intermittent agitation, aggressiveness, noncompliance to care.  Possible  SNF. 17. Rhabdomyolysis: Resolved.  This may also be a reason for abnormal LFTs. 18. Tobacco/polysubstance abuse: UDS negative. 19. Scrotal/groin fungal appearing rash: Topical nystatin cream.  Now Diflucan being started which should help.  Much improved. 20. Acute encephalopathy: Present on admission.  Likely related to DKA, septic shock, pneumonia and acute kidney injury.  Acute encephalopathy has resolved but patient has ongoing intermittent agitation, refusal of care including medications and diet, aggressive behavior and hitting out at nursing staff.  I discussed in detail with patient's sister who last visited him 2 days ago.  She states that he is supposed to see a psychiatrist but does not regularly go.  She arranges daily pills for him but he only takes what he wants and throws the rest away.  He is a heavy smoker.  His current hospital behavior is similar to that at home and hence at baseline. 21. Acute respiratory failure with hypoxia: Still requiring oxygen.  Does not appear in any distress.  Wean oxygen as tolerated.  Incentive spirometry.  Chest x-ray from 3/8 personally reviewed: Suspicious for pulmonary edema.  Lasix IV 40 mg x 1 dose.  Follow clinically. Check Echo 22. Prolonged QTC: Discontinued Diflucan.  Replace potassium and magnesium as needed.  Minimize QT prolonging medications and follow EKG periodically.   DVT prophylaxis: Subcutaneous heparin Code Status: Full Family Communication: Discussed in detail with patient's sister on 3/9, updated care and answered questions. Disposition: Admitted to [facility] ICU.  Transferred to medical bed 3/6.  Possible CIR when clinically improved.  Consultants:  CCM-signed off 3/4. Nephrology ID  Procedures:  ETT 2/26 >>  Lt IJ 2/26 >>  3/5 Lt radial A line 2/26 >>  Antimicrobials:  Rocephin 2/26 >> 3/3 Zithromax 2/26 >> 2/27 Cefepime 2/28 >> 2/29   Subjective: Ongoing pain on swallowing, retrosternal pain.  Refusal to take medications  and food.  As per RN, while ambulating back with assistance from his bathroom, sustained a fall but did not hit his head, did not sustain injuries.  ROS: As above, otherwise negative  Objective:  Vitals:   09/28/18 0508 09/28/18 0510 09/28/18 1536 09/29/18 1258  BP: (!) 167/87  140/79 (!) 151/86  Pulse: 93  91 (!) 112  Resp: 14  (!) 24   Temp: 98.7 F (37.1 C)  99.3 F (37.4 C)   TempSrc:   Oral   SpO2: (!) 84% 100% (!) 83% 95%  Weight:      Height:        Examination:  General exam: Pleasant middle-age male, moderately built and nourished lying comfortably propped up in bed.  Does not appear in any distress. Oral cavity: Lips dry with crusts peeling off- lips look better compared to last 2 days.  Poor dental hygiene, missing multiple teeth.  No oral thrush.  On the ventral aspect of the right side of tip of tongue noted approximately 0.5 cm diameter white patch. Respiratory system: Clear to auscultation/distant breath sounds.  No increased work of breathing. Cardiovascular system: S1 & S2 heard, RRR. No JVD, murmurs, rubs, gallops or clicks. No pedal edema.  Stable Gastrointestinal system: Abdomen is nondistended, soft and nontender. No organomegaly or masses felt. Normal bowel sounds heard.  Stable Central nervous system: Alert and oriented x2. No focal neurological deficits.  Stable Extremities: Symmetric 5 x 5 power. Skin: Fungal appearing rash with mild excoriation of scrotum and right side of groin.  Seems to have  almost resolved Psychiatry: Judgement and insight impaired. Mood & affect appropriate.     Data Reviewed: I have personally reviewed following labs and imaging studies  CBC: Recent Labs  Lab 09/23/18 0514 09/24/18 0306 09/25/18 0520 09/26/18 0425  WBC 12.5* 14.8* 15.3* 10.5  HGB 9.8* 9.5* 9.5* 9.4*  HCT 29.6* 29.1* 29.8* 30.7*  MCV 87.1 90.7 92.8 94.2  PLT 173 233 377 365   Basic Metabolic Panel: Recent Labs  Lab 09/22/18 1800  09/24/18 0306   09/26/18 0425 09/27/18 0249 09/28/18 0546 09/29/18 0504 09/29/18 0809  NA 142   < > 150*   < > 148* 150* 145 136 139  K 3.0*   < > 3.4*   < > 4.0 3.6 3.1* 6.1* 3.8  CL 100   < > 108   < > 108 110 105 103 99  CO2 34*   < > 33*   < > 27 28 30  20* 26  GLUCOSE 139*   < > 136*   < > 372* 179* 207* 165* 264*  BUN 89*   < > 63*   < > 41* 32* 23* 15 15  CREATININE 3.73*   < > 2.55*   < > 1.73* 1.49* 1.23 0.99 1.22  CALCIUM 6.4*   < > 6.8*   < > 7.3* 7.3* 7.3* 6.8* 7.1*  MG 1.5*  --  2.0  --   --   --  1.4* QUANTITY NOT SUFFICIENT, UNABLE TO PERFORM TEST 1.6*  PHOS 1.6*  --  2.5  --   --   --   --   --   --    < > = values in this interval not displayed.   Liver Function Tests: Recent Labs  Lab 09/25/18 0520 09/26/18 0425 09/27/18 0249 09/28/18 0546 09/29/18 0504  AST 136* 55* 39 23 73*  ALT 314* 222* 148* 103* QUANTITY NOT SUFFICIENT, UNABLE TO PERFORM TEST  ALKPHOS 220* 192* 153* 132* 126  BILITOT 1.1 0.5 1.0 1.2 QUANTITY NOT SUFFICIENT, UNABLE TO PERFORM TEST  PROT 4.7* 5.0* 5.1* 5.5* 5.8*  ALBUMIN 1.4* 1.6* 1.6* 1.8* 1.8*    Cardiac Enzymes: Recent Labs  Lab 09/25/18 0520  CKTOTAL 60   CBG: Recent Labs  Lab 09/28/18 1242 09/28/18 1709 09/29/18 0503 09/29/18 0751 09/29/18 1206  GLUCAP 150* 105* 171* 254* 217*    No results found for this or any previous visit (from the past 240 hour(s)).       Radiology Studies: Dg Chest 2 View  Result Date: 09/28/2018 CLINICAL DATA:  Hypoxia EXAM: CHEST - 2 VIEW COMPARISON:  09/24/2018 chest radiograph. FINDINGS: Stable cardiomediastinal silhouette with normal heart size. No pneumothorax. Small bilateral pleural effusions. Extensive patchy opacity throughout both lungs is mildly improved bilaterally. IMPRESSION: 1. Mildly improved extensive patchy opacity throughout both lungs, suggesting improving multifocal pneumonia and/or pulmonary edema. 2. Small bilateral pleural effusions. Electronically Signed   By: Delbert Phenix M.D.    On: 09/28/2018 16:27        Scheduled Meds: . acyclovir ointment   Topical Q3H  . amLODipine  5 mg Oral Daily  . DULoxetine  60 mg Oral BID  . feeding supplement (ENSURE ENLIVE)  237 mL Oral TID BM  . free water  100 mL Oral Q4H  . heparin  5,000 Units Subcutaneous Q8H  . insulin aspart  0-15 Units Subcutaneous TID WC  . insulin aspart  0-5 Units Subcutaneous QHS  . insulin detemir  30 Units Subcutaneous  Daily  . mouth rinse  15 mL Mouth Rinse q12n4p  . nystatin  5 mL Oral QID  . nystatin cream   Topical BID  . pantoprazole (PROTONIX) IV  40 mg Intravenous Q12H  . propranolol  10 mg Oral TID  . QUEtiapine  100 mg Oral QHS  . traZODone  50 mg Oral QHS   Continuous Infusions:    LOS: 12 days     Marcellus Scott, MD, FACP, [facility]. [facility]  To contact the attending provider between 7A-7P or the covering provider during after hours 7P-7A, please log into the web site [email or web address] and access using universal [place] password for that web site. If you do not have the password, please call the hospital operator.  09/29/2018, 1:25 PM

## 2018-09-29 NOTE — Progress Notes (Signed)
Pt had feces and papertowels with feces on it on his floor.  I cleaned it up and asked him to please call us if he needs help instead of throwing feces on the floor.  He began swearing and told me to turn the temperature down in the room.     Pt complained that right ribs hurt after falling earlier.  I assessed pt from anterior because he refused to roll or sit up.  Pt also refused Magnesium, stating "It makes me sick. I'm not taking that shit." I explained that it was for his heart and that his magnesium was low.  He told me to shut up. I paged  MD Hongalgi  From the room.  He talked pt into taking magnesium.   I continued down my list of medications to give.  I brought levemir insulin  in the room (he had refused it earlier) and he said he would take it.   When I explained that I had nystatin and it would help heal the sores he has, he said, "I don't want the fuc-ing medicine.  Why are you all so fuc-ing stupid around here."   I told him I would be back to give him his medications when he was ready to stop swearing at me.

## 2018-09-29 NOTE — Progress Notes (Signed)
Pt is refusing medications and assessment.notified md.

## 2018-09-29 NOTE — Progress Notes (Signed)
Pt was being assisted from bed to restroom by a nurse, pt fell onto his right side.  No visible injuries.  Vitals recorded.  Pt was able to ambulate with assist back to bed.  Dr. Waymon Amato notified of fall.  Pt's sister called, pt refuses to call her back. Pt told me not to call her.

## 2018-09-29 NOTE — Progress Notes (Signed)
I asked if I could listen to heart and lungs, "I don't feel like it."   When asked if I could get him a drink or anything, "no."

## 2018-09-29 NOTE — Consult Note (Signed)
Referring Provider: Dr. Waymon Amato Primary Care Physician:  System, Pcp Not In Primary Gastroenterologist:  Gentry Fitz  Reason for Consultation:  Odynophagia  HPI: Omar Wood is a 58 y.o. male with several months of burning and pain with swallowing food or liquids. Does not admit to it hanging up with eating/drinking but says eating/drinking causes severe pain. Poor historian and unwilling to describe the swallowing in detail. Repeatedly requests meds for "indigestion." Has oral lesions noted. Reports remote history of a normal EGD that was done for heartburn and thinks he had a colonoscopy but does not know who did it or when.  Past Medical History:  Diagnosis Date  . Bipolar disorder (HCC)   . Diabetes mellitus without complication (HCC)   . Hypercholesteremia   . Hypertension   . Marijuana user   . Scrotal injury   . Tobacco abuse     Past Surgical History:  Procedure Laterality Date  . APPENDECTOMY      Prior to Admission medications   Medication Sig Start Date End Date Taking? Authorizing Provider  atorvastatin (LIPITOR) 40 MG tablet Take 40 mg by mouth daily.   Yes [provider]  DULoxetine (CYMBALTA) 60 MG capsule Take 60 mg by mouth 2 (two) times daily. 07/30/18  Yes [provider]  esomeprazole (NEXIUM) 40 MG capsule Take 40 mg by mouth daily at 12 noon.   Yes [provider]  Insulin Glargine (BASAGLAR KWIKPEN) 100 UNIT/ML SOPN Inject 40 Units into the skin at bedtime. 07/30/18  Yes [provider]  insulin lispro (HUMALOG) 100 UNIT/ML injection Inject 15 Units into the skin 3 (three) times daily before meals.   Yes [provider]  lisinopril (PRINIVIL,ZESTRIL) 5 MG tablet Take 5 mg by mouth daily. 07/30/18  Yes [provider]  Melatonin 3 MG TABS Take 3 mg by mouth at bedtime.   Yes [provider]  metFORMIN (GLUCOPHAGE-XR) 500 MG 24 hr tablet Take 1,000 mg by mouth 2 (two) times daily. 07/30/18  Yes [provider]  ondansetron (ZOFRAN-ODT) 4 MG disintegrating tablet Take 4 mg by mouth every 6 (six) hours as needed for nausea or vomiting.   Yes [provider]  propranolol (INDERAL) 10 MG tablet Take 10 mg by mouth 3 (three) times daily.   Yes [provider]  QUEtiapine (SEROQUEL) 400 MG tablet Take 400 mg by mouth at bedtime. 07/30/18  Yes [provider]  traZODone (DESYREL) 50 MG tablet Take 50 mg by mouth at bedtime. 07/30/18  Yes [provider]    Scheduled Meds: . acyclovir ointment   Topical Q3H  . amLODipine  5 mg Oral Daily  . DULoxetine  60 mg Oral BID  . feeding supplement (ENSURE ENLIVE)  237 mL Oral TID BM  . free water  100 mL Oral Q4H  . heparin  5,000 Units Subcutaneous Q8H  . insulin aspart  0-15 Units Subcutaneous TID WC  . insulin aspart  0-5 Units Subcutaneous QHS  . insulin detemir  30 Units Subcutaneous Daily  . mouth rinse  15 mL Mouth Rinse q12n4p  . nystatin  5 mL Oral QID  . nystatin cream   Topical BID  . pantoprazole sodium  40 mg Oral Daily  . propranolol  10 mg Oral TID  . QUEtiapine  100 mg Oral QHS  . traZODone  50 mg Oral QHS   Continuous Infusions: PRN Meds:.acetaminophen, albuterol, dextrose, LORazepam, magic mouthwash, ondansetron (ZOFRAN) IV, zolpidem  Allergies as of 09/17/2018  . (  No Known Allergies)    History reviewed. No pertinent family history.  Social History   Socioeconomic History  . Marital status: Single    Spouse name: Not on file  . Number of children: Not on file  . Years of education: Not on file  . Highest education level: Not on file  Occupational History  . Not on file  Social Needs  . Financial resource strain: Not on file  . Food insecurity:    Worry: Not on file    Inability: Not on file  . Transportation needs:    Medical: Not on file    Non-medical: Not on file  Tobacco Use  . Smoking status: Former Smoker    Types: Cigarettes  . Smokeless tobacco: Never Used   Substance and Sexual Activity  . Alcohol use: No    Frequency: Never  . Drug use: No  . Sexual activity: Not on file  Lifestyle  . Physical activity:    Days per week: Not on file    Minutes per session: Not on file  . Stress: Not on file  Relationships  . Social connections:    Talks on phone: Not on file    Gets together: Not on file    Attends religious service: Not on file    Active member of club or organization: Not on file    Attends meetings of clubs or organizations: Not on file    Relationship status: Not on file  . Intimate partner violence:    Fear of current or ex partner: Not on file    Emotionally abused: Not on file    Physically abused: Not on file    Forced sexual activity: Not on file  Other Topics Concern  . Not on file  Social History Narrative  . Not on file    Review of Systems: All negative except as stated above in HPI.  Physical Exam: Vital signs: Vitals:   09/28/18 0510 09/28/18 1536  BP:  140/79  Pulse:  91  Resp:  (!) 24  Temp:  99.3 F (37.4 C)  SpO2: 100% (!) 83%   Last BM Date: 09/28/18 General:  Chronically ill-appearing, disheveled, no acute distress Head: normocephalic, atraumatic Eyes: anicteric sclera ENT: oropharynx clear; dried lips with oral lesions/excoriations on lips Neck: supple, nontender Lungs:  Clear throughout to auscultation.   No wheezes, crackles, or rhonchi. No acute distress. Heart:  Regular rate and rhythm; no murmurs, clicks, rubs,  or gallops. Abdomen: diffuse tenderness with guarding, soft, nondistended, +BS  Rectal:  Deferred Ext: no edema  GI:  Lab Results: No results for input(s): WBC, HGB, HCT, PLT in the last 72 hours. BMET Recent Labs    09/28/18 0546 09/29/18 0504 09/29/18 0809  NA 145 136 139  K 3.1* 6.1* 3.8  CL 105 103 99  CO2 30 20* 26  GLUCOSE 207* 165* 264*  BUN 23* 15 15  CREATININE 1.23 0.99 1.22  CALCIUM 7.3* 6.8* 7.1*   LFT Recent Labs    09/29/18 0504  PROT 5.8*   ALBUMIN 1.8*  AST 73*  ALT QUANTITY NOT SUFFICIENT, UNABLE TO PERFORM TEST  ALKPHOS 126  BILITOT QUANTITY NOT SUFFICIENT, UNABLE TO PERFORM TEST  BILIDIR QUANTITY NOT SUFFICIENT, UNABLE TO PERFORM TEST  IBILI NOT CALCULATED   PT/INR No results for input(s): LABPROT, INR in the last 72 hours.   Studies/Results: Dg Chest 2 View  Result Date: 09/28/2018 CLINICAL DATA:  Hypoxia EXAM: CHEST - 2 VIEW COMPARISON:  09/24/2018 chest radiograph. FINDINGS: Stable cardiomediastinal silhouette with normal heart size. No pneumothorax. Small bilateral pleural effusions. Extensive patchy opacity throughout both lungs is mildly improved bilaterally. IMPRESSION: 1. Mildly improved extensive patchy opacity throughout both lungs, suggesting improving multifocal pneumonia and/or pulmonary edema. 2. Small bilateral pleural effusions. Electronically Signed   By: Delbert Phenix M.D.   On: 09/28/2018 16:27    Impression/Plan: Odynophagia with herpetic-appearing lesions on his lips. I think he has infectious esophagitis causing his odynophagia likely herpes vs Candida. EGD with possible biopsies needed. Continue Magic mouthwash. Change to IV PPI Q 12 hours. NPO p MN for EGD to be done tomorrow by Dr. Ewing Schlein.    LOS: 12 days   Shirley Friar  09/29/2018, 11:04 AM  Questions please call [phone]

## 2018-09-29 NOTE — Progress Notes (Addendum)
Pt has chewed up food all over his floor. He doesn't know what it is or how it got there. It's also on his blankets.  I explained the need for potassium to patient.  He stated, "I have nowhere to go if I get better."    Pt states po potassium makes him sick.  Pt also refused other po medications.

## 2018-09-29 NOTE — Progress Notes (Signed)
  Speech Language Pathology Treatment: Dysphagia  Patient Details Name: Omar Wood MRN: [id number] DOB: 06-06-1961 Today's Date: 09/29/2018 Time: 7035-0093 SLP Time Calculation (min) (ACUTE ONLY): 10 min  Assessment / Plan / Recommendation Clinical Impression  Skilled treatment session focused on dysphagia goals. SLP received pt in bed, he presents as irritable and uncooperative d/t self-reported lack of sleep. Pt refused PO intake d/t severe odynophagia. Pt unwilling to rate pain level. He states that he has been experiencing odynophagia for 3 to 4 weeks with no change in pain related to solids vs. Liquids, continues to be severe with swallowing own salvia and temperature of bolus doesn't impact pain. SLP spoke with pt's MD [facility]) who is requesting GI consult. ST to continue following pt to determine least restrictive diet. Continue with current diet and POC at this time.    HPI HPI: Pt is a 58 yo male smoker transferred from [facility] 2/26 with PMH of DM, Bipolar, Anxiety, Polysubstance abuse, HTN and HLD presenting with DKA with altered mental status, hypotension, and respiratory failure. ETT:  2/26-3/3. Reported by MD to be coughing on ice chips post extubation.      SLP Plan  Continue with current plan of care       Recommendations  Diet recommendations: Dysphagia 2 (fine chop);Thin liquid Liquids provided via: Cup;Straw Medication Administration: Crushed with puree Supervision: Patient able to self feed;Intermittent supervision to cue for compensatory strategies Compensations: Slow rate;Small sips/bites Postural Changes and/or Swallow Maneuvers: Seated upright 90 degrees                Oral Care Recommendations: Oral care BID Follow up Recommendations: 24 hour supervision/assistance SLP Visit Diagnosis: Dysphagia, oropharyngeal phase (R13.12) Plan: Continue with current plan of care       GO                Rmani Kapusta 09/29/2018, 10:36 AM

## 2018-09-29 NOTE — Plan of Care (Signed)
  Problem: Education: Goal: Ability to describe self-care measures that may prevent or decrease complications (Diabetes Survival Skills Education) will improve Outcome: Not Progressing   Problem: Cardiac: Goal: Ability to maintain an adequate cardiac output will improve Outcome: Progressing

## 2018-09-29 NOTE — Progress Notes (Signed)
Pharmacy Antibiotic Note  Omar Wood is a 58 y.o. male admitted on 09/17/2018 with sepsis, now with esophageal candidiasis.  Pharmacy has been consulted for Fluconazole dosing.  ID: abx for sepsis (Group B strep and Klebsiella PNA) . Now with suspected esophageal candidiasis. CXR persistent perihilar/bibasilar opacities, likely pulm edema Temp 99.3, WBC down to 10.5, Scr 1.22, +nystatin  Fluconazole 3/6 >>(3/19) Azithro 2/26 >> 2/28 CTX 2/26 >> 2/28; 2/29 > 3/3 Cefepime 2/28 >> 2/29  2/26 MRSA PCR: neg 2/26 BCx - negative 2/26 Ucx: negative 2/26 TA - few Klebsiella (pan sensitive except amp) + moderate group B strep  Plan: Fluconazole 200 mg PO daily x14 days Pharmacy will sign off. Please reconsult for further dosing assitance.      Height: 5' 11.65" (182 cm) Weight: 166 lb 10.7 oz (75.6 kg) IBW/kg (Calculated) : 76.8  Temp (24hrs), Avg:99.3 F (37.4 C), Min:99.3 F (37.4 C), Max:99.3 F (37.4 C)  Recent Labs  Lab 09/23/18 0514 09/24/18 0306  09/25/18 0520 09/26/18 0425 09/27/18 0249 09/28/18 0546 09/29/18 0504 09/29/18 0809  WBC 12.5* 14.8*  --  15.3* 10.5  --   --   --   --   CREATININE 3.38* 2.55*   < > 2.19* 1.73* 1.49* 1.23 0.99 1.22   < > = values in this interval not displayed.    Estimated Creatinine Clearance: 71.4 mL/min (by C-G formula based on SCr of 1.22 mg/dL).    No Known Allergies   Elinora Weigand S. Merilynn Finland, PharmD, BCPS Clinical Staff Pharmacist Misty Stanley Stillinger 09/29/2018 9:44 AM

## 2018-09-29 NOTE — Progress Notes (Signed)
CSW received SNF consult. Due to legal history and behaviors patient will be difficult to place. CSW will staff case with CSW AD.  Omar Casco Kayela Humphres LCSW [phone]

## 2018-09-29 NOTE — Procedures (Signed)
Came bedside for echo, but patient wants medication for pain and to speak to doctor before testing.  Nurse not available to come to room at this time.

## 2018-09-29 NOTE — Progress Notes (Signed)
PT Cancellation Note  Patient Details Name: Omar Wood MRN: [id number] DOB: [birth year]   Cancelled Treatment:    Reason Eval/Treat Not Completed: Patient declined, no reason specified Patient adamantly refusing PT services at this time. PT attempting to educate on importance of OOB mobility with patient continuing to decline. Will continue to follow.     Kipp Laurence, PT, DPT Supplemental Physical Therapist 09/29/18 2:01 PM Pager: [phone] Office: [phone]

## 2018-09-29 NOTE — Progress Notes (Signed)
At beginning of shift patient was very irritated to the point of attempting to kick the NT and yell at nurse for light being on in the room. Called on call and got an order for antianxiety. He fell asleep and did not use the medication. This am the lab woke him up and found him to have urine and stool in the bed. Waiting on assistance of the NT the patient stood up yanking and pulling on the sheets He tangled himself with them and I had to tell him to stop before he fell. I picked the soiled sheets up off the floor and told him I would help clean him up. He stated he didn't need to be cleaned just give him a blanket, I did. Later the NT and I went together to clean him up and make his bed. He still refuses and says he's fine and doesn't need anything. I reported to the charge nurse his refusal of care.

## 2018-09-30 ENCOUNTER — Inpatient Hospital Stay (HOSPITAL_COMMUNITY): Payer: Medicaid Other

## 2018-09-30 ENCOUNTER — Inpatient Hospital Stay (HOSPITAL_COMMUNITY): Payer: Medicaid Other | Admitting: Anesthesiology

## 2018-09-30 ENCOUNTER — Other Ambulatory Visit: Payer: Self-pay

## 2018-09-30 ENCOUNTER — Encounter (HOSPITAL_COMMUNITY): Payer: Self-pay | Admitting: *Deleted

## 2018-09-30 ENCOUNTER — Encounter (HOSPITAL_COMMUNITY)
Admission: AD | Disposition: A | Discharge status: Home / Self Care | Payer: Self-pay | Source: Other Acute Inpatient Hospital | Attending: Internal Medicine

## 2018-09-30 HISTORY — PX: ESOPHAGEAL BRUSHING: SHX6842

## 2018-09-30 HISTORY — PX: BIOPSY: SHX5522

## 2018-09-30 HISTORY — PX: ESOPHAGOGASTRODUODENOSCOPY (EGD) WITH PROPOFOL: SHX5813

## 2018-09-30 LAB — COMPREHENSIVE METABOLIC PANEL
ALBUMIN: 2 g/dL — AB (ref 3.5–5.0)
ALT: 58 U/L — ABNORMAL HIGH (ref 0–44)
AST: 20 U/L (ref 15–41)
Alkaline Phosphatase: 128 U/L — ABNORMAL HIGH (ref 38–126)
Anion gap: 12 (ref 5–15)
BUN: 15 mg/dL (ref 6–20)
CHLORIDE: 96 mmol/L — AB (ref 98–111)
CO2: 28 mmol/L (ref 22–32)
Calcium: 7.5 mg/dL — ABNORMAL LOW (ref 8.9–10.3)
Creatinine, Ser: 1.02 mg/dL (ref 0.61–1.24)
GFR calc Af Amer: >60 mL/min (ref >60)
GFR calc non Af Amer: >60 mL/min (ref >60)
Glucose, Bld: 279 mg/dL — ABNORMAL HIGH (ref 70–99)
Potassium: 3.2 mmol/L — ABNORMAL LOW (ref 3.5–5.1)
Sodium: 136 mmol/L (ref 135–145)
Total Bilirubin: 0.8 mg/dL (ref 0.3–1.2)
Total Protein: 6 g/dL — ABNORMAL LOW (ref 6.5–8.1)

## 2018-09-30 LAB — MAGNESIUM: MAGNESIUM: 1.8 mg/dL (ref 1.7–2.4)

## 2018-09-30 LAB — GLUCOSE, CAPILLARY
GLUCOSE-CAPILLARY: 314 mg/dL — AB (ref 70–99)
Glucose-Capillary: 209 mg/dL — ABNORMAL HIGH (ref 70–99)
Glucose-Capillary: 155 mg/dL — ABNORMAL HIGH (ref 70–99)
Glucose-Capillary: 161 mg/dL — ABNORMAL HIGH (ref 70–99)

## 2018-09-30 SURGERY — ESOPHAGOGASTRODUODENOSCOPY (EGD) WITH PROPOFOL
Anesthesia: Monitor Anesthesia Care

## 2018-09-30 MED ORDER — POTASSIUM CHLORIDE 10 MEQ/100ML IV SOLN
10.0000 meq | INTRAVENOUS | Status: DC
  Administered 2018-09-30 (×2): 10 meq via INTRAVENOUS
  Filled 2018-09-30 (×2): qty 100

## 2018-09-30 MED ORDER — LACTATED RINGERS IV SOLN
INTRAVENOUS | Status: DC | PRN
  Administered 2018-09-30: 14:00:00 via INTRAVENOUS

## 2018-09-30 MED ORDER — PROPOFOL 10 MG/ML IV BOLUS
INTRAVENOUS | Status: DC | PRN
  Administered 2018-09-30 (×4): 10 mg via INTRAVENOUS

## 2018-09-30 MED ORDER — DEXMEDETOMIDINE HCL 200 MCG/2ML IV SOLN
INTRAVENOUS | Status: DC | PRN
  Administered 2018-09-30 (×2): 4 ug via INTRAVENOUS

## 2018-09-30 MED ORDER — MIDAZOLAM HCL (PF) 5 MG/ML IJ SOLN
INTRAMUSCULAR | Status: AC
  Filled 2018-09-30: qty 1

## 2018-09-30 MED ORDER — GLYCOPYRROLATE 0.2 MG/ML IJ SOLN: INTRAMUSCULAR | Status: DC | PRN

## 2018-09-30 MED ORDER — INSULIN DETEMIR 100 UNIT/ML ~~LOC~~ SOLN
35.0000 [IU] | Freq: Every day | SUBCUTANEOUS | Status: DC
  Filled 2018-09-30: qty 0.35

## 2018-09-30 MED ORDER — POTASSIUM CHLORIDE 10 MEQ/100ML IV SOLN: 10.0000 meq | INTRAVENOUS | Status: DC

## 2018-09-30 MED ORDER — SODIUM CHLORIDE 0.9 % IV SOLN: INTRAVENOUS | Status: DC

## 2018-09-30 MED ORDER — LIDOCAINE VISCOUS HCL 2 % MT SOLN
15.0000 mL | Freq: Three times a day (TID) | OROMUCOSAL | Status: DC
  Administered 2018-10-01 – 2018-10-04 (×2): 15 mL via OROMUCOSAL
  Filled 2018-09-30 (×5): qty 15

## 2018-09-30 MED ORDER — PROPOFOL 500 MG/50ML IV EMUL
INTRAVENOUS | Status: DC | PRN
  Administered 2018-09-30: 100 ug/kg/min via INTRAVENOUS

## 2018-09-30 MED ORDER — MORPHINE SULFATE (PF) 2 MG/ML IV SOLN
1.0000 mg | INTRAVENOUS | Status: DC | PRN
  Administered 2018-09-30 – 2018-10-03 (×8): 1 mg via INTRAVENOUS
  Filled 2018-09-30 (×8): qty 1

## 2018-09-30 MED ORDER — MAGNESIUM SULFATE 2 GM/50ML IV SOLN
2.0000 g | Freq: Once | INTRAVENOUS | Status: AC
  Administered 2018-09-30: 2 g via INTRAVENOUS
  Filled 2018-09-30: qty 50

## 2018-09-30 MED ORDER — MIDAZOLAM HCL 2 MG/2ML IJ SOLN
0.5000 mg | Freq: Once | INTRAMUSCULAR | Status: AC
  Administered 2018-09-30: 0.5 mg via INTRAVENOUS

## 2018-09-30 SURGICAL SUPPLY — 15 items

## 2018-09-30 NOTE — Progress Notes (Addendum)
Addendum  Chest x-ray personally reviewed.  Patchy bilateral opacities, stable.?  Chronic, ? ILD/chronic Aspiration  Ordered CT chest without contrast to further evaluate.  Marcellus Scott, MD, FACP, [facility]. [facility]  To contact the attending provider between 7A-7P or the covering provider during after hours 7P-7A, please log into the web site [email or web address] and access using universal [place] password for that web site. If you do not have the password, please call the hospital operator.

## 2018-09-30 NOTE — Progress Notes (Signed)
Writer arrived to floor per consult. Discussed with primary care RN prior to attempt. Primary care RN states patient may NOT be agreeable to placement. Writer introduced herself and educated patient on need for PIV placement. Patient refused. Requested that writer leave room immediately. Primary care RN updated.

## 2018-09-30 NOTE — Anesthesia Preprocedure Evaluation (Signed)
Anesthesia Evaluation  Patient identified by MRN, date of birth, ID band Patient awake    Reviewed: Allergy & Precautions, NPO status , Patient's Chart, lab work & pertinent test results  Airway Mallampati: II  TM Distance: >3 FB     Dental   Pulmonary pneumonia, former smoker,    breath sounds clear to auscultation       Cardiovascular hypertension, Pt. on medications  Rhythm:Regular Rate:Normal     Neuro/Psych negative neurological ROS     GI/Hepatic Neg liver ROS, dysphagia   Endo/Other  diabetes, Type 2, Insulin Dependent, Oral Hypoglycemic Agents  Renal/GU negative Renal ROS     Musculoskeletal   Abdominal   Peds  Hematology  (+) anemia ,   Anesthesia Other Findings   Reproductive/Obstetrics                             Anesthesia Physical Anesthesia Plan  ASA: III  Anesthesia Plan: MAC   Post-op Pain Management:    Induction: Intravenous  PONV Risk Score and Plan: 1 and Propofol infusion, Ondansetron and Treatment may vary due to age or medical condition  Airway Management Planned: Natural Airway and Nasal Cannula  Additional Equipment:   Intra-op Plan:   Post-operative Plan:   Informed Consent: I have reviewed the patients History and Physical, chart, labs and discussed the procedure including the risks, benefits and alternatives for the proposed anesthesia with the patient or authorized representative who has indicated his/her understanding and acceptance.       Plan Discussed with: CRNA  Anesthesia Plan Comments:         Anesthesia Quick Evaluation

## 2018-09-30 NOTE — Op Note (Signed)
[facility] Patient Name: Omar Wood Procedure Date : 09/30/2018 MRN: [id number] Attending MD: Vida Rigger , MD Date of Birth: [birth year] CSN: [id number] Age: 58 Admit Type: Inpatient Procedure:                Upper GI endoscopy Indications:              Dysphagia, Odynophagia Providers:                Vida Rigger, MD, Clearnce Sorrel, RN, Harrington Challenger,                            Technician, Glo Herring, CRNA Referring MD:              Medicines:                Propofol total dose 300 mg IV 8 mg Precedex Complications:            No immediate complications. Estimated Blood Loss:     Estimated blood loss: none. Procedure:                Pre-Anesthesia Assessment:                           - Prior to the procedure, a History and Physical                            was performed, and patient medications and                            allergies were reviewed. The patient's tolerance of                            previous anesthesia was also reviewed. The risks                            and benefits of the procedure and the sedation                            options and risks were discussed with the patient.                            All questions were answered, and informed consent                            was obtained. Prior Anticoagulants: The patient has                            taken heparin, last dose was 1 day prior to                            procedure. ASA Grade Assessment: II - A patient                            with mild systemic disease. After reviewing the  risks and benefits, the patient was deemed in                            satisfactory condition to undergo the procedure.                           After obtaining informed consent, the endoscope was                            passed under direct vision. Throughout the                            procedure, the patient's blood pressure, pulse, and   oxygen saturations were monitored continuously. The                            GIF-H190 ([id number]) Olympus gastroscope was                            introduced through the mouth, and advanced to the                            third part of duodenum. The upper GI endoscopy was                            accomplished without difficulty. The patient                            tolerated the procedure well. Scope In: Scope Out: Findings:      The larynx was normal.      Moderately severe esophagitis with no bleeding was found. Biopsies were       taken with a cold forceps for histology. Cells for cytology were       obtained by brushing.      A small hiatal hernia was present.      Patchy mild inflammation characterized by erythema was found in the       gastric antrum.      Patchy mild inflammation characterized by erythema was found in the       duodenal bulb.      The second portion of the duodenum and third portion of the duodenum       were normal.      The exam was otherwise without abnormality. Impression:               - Normal larynx.                           - Moderately severe reflux, candidiasis and erosive                            esophagitis. Biopsied. Cells for cytology obtained.                           - Small hiatal hernia.                           -  Chronic gastritis.                           - Chronic duodenitis.                           - Normal second portion of the duodenum and third                            portion of the duodenum.                           - The examination was otherwise normal. Moderate Sedation:      moderate sedation-none Recommendation:           - Clear liquid diet today. Slowly advance as                            tolerated trial of viscous lidocaine prior to eating                           - Continue present medications.                           - Await pathology results.                           - Return to GI clinic PRN.                            - Telephone GI clinic for pathology results in 1                            week. Please call if we could be of any further                            assistance with this hospital stay                           - Telephone GI clinic if symptomatic PRN. Procedure Code(s):        --- Professional ---                           [phone], Esophagogastroduodenoscopy, flexible,                            transoral; with biopsy, single or multiple Diagnosis Code(s):        --- Professional ---                           K21.0, Gastro-esophageal reflux disease with                            esophagitis                           B37.81, Candidal esophagitis  K20.8, Other esophagitis                           K44.9, Diaphragmatic hernia without obstruction or                            gangrene                           K29.50, Unspecified chronic gastritis without                            bleeding                           K29.80, Duodenitis without bleeding                           R13.10, Dysphagia, unspecified CPT copyright 2018 [employer]. All rights reserved. The codes documented in this report are preliminary and upon coder review may  be revised to meet current compliance requirements. Vida Rigger, MD 09/30/2018 2:06:05 PM This report has been signed electronically. Number of Addenda: 0

## 2018-09-30 NOTE — Progress Notes (Signed)
Patient refused all morning oral medications. Patient refused Echo. Echo tech called male tech to re-attempt. Spoke with family and they spoke with patient to agree to treatment. Family and patient stated he would agree to go to a rehab facility once able to be discharged.

## 2018-09-30 NOTE — Progress Notes (Signed)
PROGRESS NOTE   Omar Wood  ZOX:[id number]    DOB: 02-Oct-1960    DOA: 09/17/2018  PCP: System, Pcp Not In   I have briefly reviewed patients previous medical records in [facility]  Brief Narrative:  58 year old male, PMH of DM 2, HTN, HLD, anxiety disorder/bipolar disorder, tobacco abuse, polysubstance abuse, presented to [facility] ED on 2/26 unresponsive with assisted ventilation by bag valve mask and was intubated upon arrival.  Initial work-up: UDS negative, lipase 1371, glucose 942, BUN 71, creatinine 3.88, sodium 114, potassium 5.5, chloride 67, BAL <10, WBC 27, urine microscopy positive for ketones.  He was hypotensive on pressors.  He was transferred to [facility] ICU under CCM care for further management.  He was admitted for DKA, acute metabolic encephalopathy, septic shock from pneumonia and acute hypoxic respiratory failure.  After improvement and stabilization, care transferred to [facility] on 3/5.  Hospital course complicated by poor oral intake secondary to odynophagia, intermittent agitation and refusal of medication/care.  ID and [facility] consulted for oral pain/lip ulcers and odynophagia.  Status post EGD 3/10.   Assessment & Plan:   Active Problems:   DKA (diabetic ketoacidoses) (HCC)   Shock (HCC)   1. Septic shock due to group B strep and Klebsiella pneumonia: Required pressors early on in admission.  Completed 7 days course of antibiotics.  Resolved. 2. Acute renal failure due to ATN: Nonoliguric.  Nephrology consulted and signed off.  Acute kidney injury resolved.  Creatinine normal. 3. Hypernatremia: Likely due to post ATN diuresis.  Sodium peaked to 152.  Treated with hypotonic IV fluids, oral free water intake.  Resolved. 4. DM2 not at goal/DKA: Patient was on insulins PTA and claims compliance.  However A1c 2/28: 11.4 indicates very poor outpatient control.  Increased Levemir to 35 units daily beginning 3/11 (home dose is 40 units), continue NovoLog SSI.  Monitor CBGs  closely and uptitrate insulins as needed.  DM coordinator input appreciated.  DKA resolved.  Metformin held.  Hesitant to add mealtime NovoLog due to inconsistent oral intake.  Uncontrolled and fluctuating as below.  Patient intermittently refusing medications including insulins. 5. Essential hypertension: Holding lisinopril.  Resumed home dose of Inderal 10 mg 3 times daily and added amlodipine 5 mg daily with better control. 6. Hyperlipidemia: Hold statins due to abnormal LFTs. 7. Abnormal LFTs: In the setting of septic shock.  AST and ALT peaked in the 781-815 range.  LFTs have almost normalized.  RUQ ultrasound unremarkable. 8. Anxiety disorder/bipolar disorder: Continue Cymbalta, Seroquel and trazodone as per prior home dose.  Delirium precautions.  EKG 3/9: QTC 494 ms, see discussion below.  Follow EKG for QTC in a.m. after replacing potassium. 9. Dysphagia/odynophagia: Speech therapy evaluated 3/5 and recommend dysphagia 2 diet and thin liquids.  Not improving with fluconazole, please see discussion below. 10. Moderately severe reflux, candidiasis and erosive esophagitis: Treated with Magic mouthwash, brief Diflucan (discontinued due to prolongation of QTC), nystatin orally without significant improvement.  [facility] was consulted and underwent EGD 3/10 with results as below.  Slowly advance diet, continue oral nystatin.  Trial of viscous lidocaine prior to eating.  Follow biopsy pathology results.  This seems to be his major problem now. 11. Lip/tongue lesions:Unclear etiology.  ID input appreciated.  Topical acyclovir to lip lesions-not typical for zoster,?  HSV.  White plaque on tongue?  Leukoplakia- if does not improve then outpatient follow-up with oral surgery for biopsy consideration. 12. Normocytic anemia: Stable. 13. Thrombocytopenia: Transient and  likely related to acute illness/infectious etiology.  Resolved. 14. Leukocytosis: Possibly stress response.  Completed antibiotic course for  pneumonia.  Stable.  Resolved. 15. Hypokalemia/hypomagnesemia: Replace and follow.  Attempt to keep potassium >4 and magnesium >2. 16. Deconditioning: Mobilize, PT and OT evaluation appreciated.  CIR declined due to intermittent agitation, aggressiveness, noncompliance to care.  Possible SNF. 17. Rhabdomyolysis: Resolved.  This may also be a reason for abnormal LFTs. 18. Tobacco/polysubstance abuse: UDS negative. 19. Scrotal/groin fungal appearing rash: Topical nystatin cream.  Now Diflucan being started which should help.  Much improved. 20. Acute encephalopathy: Present on admission.  Likely related to DKA, septic shock, pneumonia and acute kidney injury.  Acute encephalopathy has resolved but patient has ongoing intermittent agitation, refusal of care including medications and diet, aggressive behavior and hitting out at nursing staff.  I discussed in detail with patient's sister who last visited him 2 days ago.  She states that he is supposed to see a psychiatrist but does not regularly go.  She arranges daily pills for him but he only takes what he wants and throws the rest away.  He is a heavy smoker.  His current hospital behavior is similar to that at home and hence at baseline.  Patient does have capacity to make medical decisions. 21. Acute respiratory failure with hypoxia: Still requiring oxygen.  Does not appear in any distress.  Wean oxygen as tolerated.  Incentive spirometry.  Chest x-ray from 3/8 personally reviewed: Suspicious for pulmonary edema.  Lasix IV 40 mg x 1 dose.  Follow clinically. Check Echo 22. Acute diastolic CHF: Likely related to volume resuscitation for acute kidney injury.  Improved after a dose of IV Lasix 40 mg x 1 on 3/9.  Follow repeat chest x-ray.  Follow TTE.  Lasix as needed. 23. Prolonged QTC: Discontinued Diflucan.  Replace potassium and magnesium as needed.  Minimize QT prolonging medications and follow EKG periodically. 24. Status mechanical fall 3/9: Patient  fell while returning from bathroom, hit right side of his chest but did not hit head.  No LOC.  Reports right-sided chest pain.  Chest x-ray without rib fractures.  Symptomatic treatment.   DVT prophylaxis: Subcutaneous heparin Code Status: Full Family Communication: Discussed in detail with patient's sister on 3/9, updated care and answered questions.  None at bedside today. Disposition: Admitted to [facility] ICU.  Transferred to medical bed 3/6.  Possible DC to SNF pending clinical improvement.  Consultants:  CCM-signed off 3/4. Nephrology ID [facility]  Procedures:  ETT 2/26 >>  Lt IJ 2/26 >>  3/5 Lt radial A line 2/26 >>  EGD 3/10  Impression - Normal larynx. - Moderately severe reflux, candidiasis and erosive esophagitis. Biopsied. Cells for cytology obtained. - Small hiatal hernia. - Chronic gastritis. - Chronic duodenitis. - Normal second portion of the duodenum and third portion of the duodenum. - The examination was otherwise normal.  Recommendations - Clear liquid diet today. Slowly advance as tolerated trial of viscous lidocaine prior to eating - Continue present medications. - Await pathology results. - Return to GI clinic PRN. - Telephone GI clinic for pathology results in 1 week. Please call if we could be of any further assistance with this hospital stay - Telephone GI clinic if symptomatic PRN.  Antimicrobials:  Rocephin 2/26 >> 3/3 Zithromax 2/26 >> 2/27 Cefepime 2/28 >> 2/29 Diflucan-discontinued Oral nystatin   Subjective: Overnight events noted.  Refused all night medications including insulins.  Ongoing behavioral issues.  Reports right-sided "rib pain" from  fall.  Ongoing painful swallowing.  ROS: As above, otherwise negative  Objective:  Vitals:   09/30/18 1209 09/30/18 1212 09/30/18 1408 09/30/18 1410  BP: (!) 176/87  117/66 117/66  Pulse:   (!) 102 (!) 107  Resp: (!) 22  19 (!) 21  Temp: 98.7 F (37.1 C)     TempSrc: Oral     SpO2: (!)  84% 94% 98% 100%  Weight:      Height:        Examination:  General exam: Pleasant middle-age male, moderately built and nourished lying comfortably propped up in bed.  Does not appear in any distress. Oral cavity: Lips dry with crusts peeling off- lips look better compared to last 3 days.  Poor dental hygiene, missing multiple teeth.  No oral thrush.  On the ventral aspect of the right side of tip of tongue noted approximately 0.5 cm diameter white patch. Respiratory system: Slightly harsh breath sounds in the bases with occasional basal crackles.  Rest of lung fields clear to auscultation.  Mild right anterior chest wall tenderness without bruising noted.  No increased work of breathing. Cardiovascular system: S1 & S2 heard, RRR. No JVD, murmurs, rubs, gallops or clicks. No pedal edema.  Stable Gastrointestinal system: Abdomen is nondistended, soft and nontender. No organomegaly or masses felt. Normal bowel sounds heard.  Stable Central nervous system: Alert and oriented x2. No focal neurological deficits.  Stable Extremities: Symmetric 5 x 5 power. Skin: Fungal appearing rash with mild excoriation of scrotum and right side of groin.  Seems to have almost resolved Psychiatry: Judgement and insight impaired. Mood & affect irritable.    Data Reviewed: I have personally reviewed following labs and imaging studies  CBC: Recent Labs  Lab 09/24/18 0306 09/25/18 0520 09/26/18 0425  WBC 14.8* 15.3* 10.5  HGB 9.5* 9.5* 9.4*  HCT 29.1* 29.8* 30.7*  MCV 90.7 92.8 94.2  PLT 233 377 365   Basic Metabolic Panel: Recent Labs  Lab 09/24/18 0306  09/27/18 0249 09/28/18 0546 09/29/18 0504 09/29/18 0809 09/30/18 0645  NA 150*   < > 150* 145 136 139 136  K 3.4*   < > 3.6 3.1* 6.1* 3.8 3.2*  CL 108   < > 110 105 103 99 96*  CO2 33*   < > 28 30 20* 26 28  GLUCOSE 136*   < > 179* 207* 165* 264* 279*  BUN 63*   < > 32* 23* 15 15 15   CREATININE 2.55*   < > 1.49* 1.23 0.99 1.22 1.02  CALCIUM  6.8*   < > 7.3* 7.3* 6.8* 7.1* 7.5*  MG 2.0  --   --  1.4* QUANTITY NOT SUFFICIENT, UNABLE TO PERFORM TEST 1.6* 1.8  PHOS 2.5  --   --   --   --   --   --    < > = values in this interval not displayed.   Liver Function Tests: Recent Labs  Lab 09/26/18 0425 09/27/18 0249 09/28/18 0546 09/29/18 0504 09/30/18 0645  AST 55* 39 23 73* 20  ALT 222* 148* 103* QUANTITY NOT SUFFICIENT, UNABLE TO PERFORM TEST 58*  ALKPHOS 192* 153* 132* 126 128*  BILITOT 0.5 1.0 1.2 QUANTITY NOT SUFFICIENT, UNABLE TO PERFORM TEST 0.8  PROT 5.0* 5.1* 5.5* 5.8* 6.0*  ALBUMIN 1.6* 1.6* 1.8* 1.8* 2.0*    Cardiac Enzymes: Recent Labs  Lab 09/25/18 0520  CKTOTAL 60   CBG: Recent Labs  Lab 09/29/18 1206 09/29/18 1641 09/29/18  2223 09/30/18 0758 09/30/18 1142  GLUCAP 217* 344* 250* 314* 209*    No results found for this or any previous visit (from the past 240 hour(s)).       Radiology Studies: Dg Ribs Unilateral Right  Result Date: 09/29/2018 CLINICAL DATA:  Right rib pain. EXAM: RIGHT RIBS - 2 VIEW COMPARISON:  Radiographs of September 28, 2018. FINDINGS: No fracture or other bone lesions are seen involving the ribs. IMPRESSION: Negative. Electronically Signed   By: Lupita Raider, M.D.   On: 09/29/2018 17:36        Scheduled Meds: . acyclovir ointment   Topical Q3H  . amLODipine  5 mg Oral Daily  . DULoxetine  60 mg Oral BID  . feeding supplement (ENSURE ENLIVE)  237 mL Oral TID BM  . free water  100 mL Oral Q4H  . heparin  5,000 Units Subcutaneous Q8H  . insulin aspart  0-15 Units Subcutaneous TID WC  . insulin aspart  0-5 Units Subcutaneous QHS  . insulin detemir  30 Units Subcutaneous Daily  . mouth rinse  15 mL Mouth Rinse q12n4p  . nystatin  5 mL Oral QID  . nystatin cream   Topical BID  . pantoprazole (PROTONIX) IV  40 mg Intravenous Q12H  . potassium chloride  40 mEq Oral Once  . propranolol  10 mg Oral TID  . QUEtiapine  100 mg Oral QHS  . traZODone  50 mg Oral QHS    Continuous Infusions: . magnesium sulfate 1 - 4 g bolus IVPB       LOS: 13 days     Marcellus Scott, MD, FACP, [facility]. [facility]  To contact the attending provider between 7A-7P or the covering provider during after hours 7P-7A, please log into the web site [email or web address] and access using universal [place] password for that web site. If you do not have the password, please call the hospital operator.  09/30/2018, 2:50 PM

## 2018-09-30 NOTE — Progress Notes (Signed)
   09/30/18 1200  PT Visit Information  Last PT Received On 09/30/18  Reason Eval/Treat Not Completed Patient at procedure or test/unavailable   Pt was in echocardiogram and now endoscopic procedure.  Try again as time and pt allow.  Samul Dada, PT MS Acute Rehab Dept. Number: [facility] [id number] and [facility] [phone]

## 2018-09-30 NOTE — Progress Notes (Signed)
Inpatient Diabetes Program Recommendations  AACE/ADA: New Consensus Statement on Inpatient Glycemic Control (2015)  Target Ranges:  Prepandial:   less than 140 mg/dL      Peak postprandial:   less than 180 mg/dL (1-2 hours)      Critically ill patients:  140 - 180 mg/dL   Results for JOBIN, LEBERT (MRN [id number]) as of 09/30/2018 09:34  Ref. Range 09/28/2018 07:59 09/28/2018 12:42 09/28/2018 17:09  Glucose-Capillary Latest Ref Range: 70 - 99 mg/dL 408 (H) 144 (H) 818 (H)   Results for KHOL, HEINLE (MRN [id number]) as of 09/30/2018 09:34  Ref. Range 09/29/2018 07:51 09/29/2018 12:06 09/29/2018 16:41 09/29/2018 22:23 09/30/2018 07:58  Glucose-Capillary Latest Ref Range: 70 - 99 mg/dL 637 (H) 858 (H) 850 (H) 250 (H) 314 (H)   Review of Glycemic Control  Diabetes history: DM 2 Outpatient Diabetes medications: Basaglar 40 units qhs, Humalog 15 units tid meal coverage, Metformin 1000 mg BID Current orders for Inpatient glycemic control: Levemir 30 units Daily, Novolog 0-15 units tid, Novolog 0-5 units qhs  Inpatient Diabetes Program Recommendations:  Patient is on more basal insulin at home. Consider increasing Levemir to 35 units. Inconsistent meal intake per RN notes and documentation. Will hold off of meal coverage for now.   Thanks,  Christena Deem RN, MSN, BC-ADM Inpatient Diabetes Coordinator Team Pager [phone] (8a-5p)

## 2018-09-30 NOTE — Progress Notes (Signed)
Pt refused all night medications, including the insulin. He states he doesn't want anything expect his Ambien and to get some sleep.

## 2018-09-30 NOTE — Progress Notes (Signed)
SLP Cancellation Note  Patient Details Name: CEDRIC VARGHESE MRN: [id number] DOB: Mar 21, 1961   Cancelled treatment:       Reason Eval/Treat Not Completed: Medical issues which prohibited therapy (NPO for pending EGD)   Niyonna Betsill 09/30/2018, 7:32 AM

## 2018-09-30 NOTE — Progress Notes (Signed)
  Echocardiogram 2D Echocardiogram has been attempted. Patient leaving for Endoscopy procedure. Reattempt at later date.  Nyrie Sigal G Jori Thrall 09/30/2018, 12:00 PM

## 2018-09-30 NOTE — Progress Notes (Signed)
Omar Wood 1:29 PM  Subjective: Patient seen and examined in hospital computer chart reviewed and case discussed with the hospital team as well as Dr. Bosie Clos and he has odynophagia and dysphagia mostly in his mouth  Objective: Vital signs stable afebrile no acute distress exam please see preassessment evaluation labs okay  Assessment: Odynophagia dysphagia with obvious mouth lesions  Plan: Okay to proceed with endoscopy today with anesthesia assistance  [facility]  Pager [phone] After 5PM or if no answer call [phone]

## 2018-09-30 NOTE — Transfer of Care (Signed)
Immediate Anesthesia Transfer of Care Note  Patient: Omar Wood  Procedure(s) Performed: ESOPHAGOGASTRODUODENOSCOPY (EGD) WITH PROPOFOL (N/A )  Patient Location: Short Stay  Anesthesia Type:MAC  Level of Consciousness: drowsy and patient cooperative  Airway & Oxygen Therapy: Patient Spontanous Breathing and Patient connected to nasal cannula oxygen  Post-op Assessment: Report given to RN and Post -op Vital signs reviewed and stable  Post vital signs: Reviewed and stable  Last Vitals:  Vitals Value Taken Time  BP 117/66 09/30/2018  2:08 PM  Temp    Pulse 102 09/30/2018  2:09 PM  Resp 19 09/30/2018  2:09 PM  SpO2 98 % 09/30/2018  2:09 PM    Last Pain:  Vitals:   09/30/18 1209  TempSrc: Oral  PainSc: 10-Worst pain ever         Complications: No apparent anesthesia complications

## 2018-10-01 ENCOUNTER — Encounter (HOSPITAL_COMMUNITY): Payer: Self-pay | Admitting: Gastroenterology

## 2018-10-01 ENCOUNTER — Inpatient Hospital Stay (HOSPITAL_COMMUNITY): Payer: Medicaid Other

## 2018-10-01 DIAGNOSIS — R1310 Dysphagia, unspecified: Secondary | ICD-10-CM

## 2018-10-01 DIAGNOSIS — B379 Candidiasis, unspecified: Secondary | ICD-10-CM

## 2018-10-01 DIAGNOSIS — G9341 Metabolic encephalopathy: Secondary | ICD-10-CM

## 2018-10-01 DIAGNOSIS — I5031 Acute diastolic (congestive) heart failure: Secondary | ICD-10-CM

## 2018-10-01 DIAGNOSIS — I509 Heart failure, unspecified: Secondary | ICD-10-CM

## 2018-10-01 LAB — COMPREHENSIVE METABOLIC PANEL
ALT: 43 U/L (ref 0–44)
AST: 19 U/L (ref 15–41)
Albumin: 2 g/dL — ABNORMAL LOW (ref 3.5–5.0)
Alkaline Phosphatase: 115 U/L (ref 38–126)
Anion gap: 11 (ref 5–15)
BUN: 12 mg/dL (ref 6–20)
CO2: 29 mmol/L (ref 22–32)
Calcium: 7.6 mg/dL — ABNORMAL LOW (ref 8.9–10.3)
Chloride: 96 mmol/L — ABNORMAL LOW (ref 98–111)
Creatinine, Ser: 0.91 mg/dL (ref 0.61–1.24)
GFR calc Af Amer: >60 mL/min (ref >60)
GFR calc non Af Amer: >60 mL/min (ref >60)
Glucose, Bld: 247 mg/dL — ABNORMAL HIGH (ref 70–99)
Potassium: 2.9 mmol/L — ABNORMAL LOW (ref 3.5–5.1)
Sodium: 136 mmol/L (ref 135–145)
TOTAL PROTEIN: 6.1 g/dL — AB (ref 6.5–8.1)
Total Bilirubin: 0.6 mg/dL (ref 0.3–1.2)

## 2018-10-01 LAB — GLUCOSE, CAPILLARY
Glucose-Capillary: 417 mg/dL — ABNORMAL HIGH (ref 70–99)
Glucose-Capillary: 227 mg/dL — ABNORMAL HIGH (ref 70–99)
Glucose-Capillary: 85 mg/dL (ref 70–99)
Glucose-Capillary: 155 mg/dL — ABNORMAL HIGH (ref 70–99)

## 2018-10-01 LAB — CBC
HCT: 33.8 % — ABNORMAL LOW (ref 39.0–52.0)
Hemoglobin: 11.2 g/dL — ABNORMAL LOW (ref 13.0–17.0)
MCH: 28.9 pg (ref 26.0–34.0)
MCHC: 33.1 g/dL (ref 30.0–36.0)
MCV: 87.1 fL (ref 80.0–100.0)
Platelets: 360 10*3/uL (ref 150–400)
RBC: 3.88 MIL/uL — ABNORMAL LOW (ref 4.22–5.81)
RDW: 12.4 % (ref 11.5–15.5)
WBC: 8.7 10*3/uL (ref 4.0–10.5)
nRBC: 0 % (ref 0.0–0.2)

## 2018-10-01 LAB — MAGNESIUM: Magnesium: 1.5 mg/dL — ABNORMAL LOW (ref 1.7–2.4)

## 2018-10-01 LAB — ECHOCARDIOGRAM COMPLETE
Height: 71.654 in
Weight: 2412.71 oz

## 2018-10-01 MED ORDER — MAGNESIUM SULFATE 2 GM/50ML IV SOLN
2.0000 g | Freq: Once | INTRAVENOUS | Status: AC
  Administered 2018-10-01: 2 g via INTRAVENOUS
  Filled 2018-10-01 (×2): qty 50

## 2018-10-01 MED ORDER — AMOXICILLIN-POT CLAVULANATE 875-125 MG PO TABS
1.0000 | ORAL_TABLET | Freq: Two times a day (BID) | ORAL | Status: DC
  Filled 2018-10-01 (×2): qty 1

## 2018-10-01 MED ORDER — POTASSIUM CHLORIDE CRYS ER 20 MEQ PO TBCR: 40.0000 meq | EXTENDED_RELEASE_TABLET | Freq: Once | ORAL | Status: DC

## 2018-10-01 MED ORDER — INSULIN DETEMIR 100 UNIT/ML ~~LOC~~ SOLN
40.0000 [IU] | Freq: Every day | SUBCUTANEOUS | Status: DC
  Administered 2018-10-01 – 2018-10-03 (×3): 40 [IU] via SUBCUTANEOUS
  Filled 2018-10-01 (×3): qty 0.4

## 2018-10-01 MED ORDER — INSULIN ASPART 100 UNIT/ML ~~LOC~~ SOLN
17.0000 [IU] | Freq: Once | SUBCUTANEOUS | Status: AC
  Administered 2018-10-01: 17 [IU] via SUBCUTANEOUS

## 2018-10-01 MED ORDER — FUROSEMIDE 40 MG PO TABS
40.0000 mg | ORAL_TABLET | Freq: Two times a day (BID) | ORAL | Status: DC
  Administered 2018-10-02 (×2): 40 mg via ORAL
  Filled 2018-10-01 (×3): qty 1

## 2018-10-01 MED ORDER — POTASSIUM CHLORIDE CRYS ER 20 MEQ PO TBCR
40.0000 meq | EXTENDED_RELEASE_TABLET | Freq: Once | ORAL | Status: AC
  Administered 2018-10-01: 40 meq via ORAL
  Filled 2018-10-01: qty 2

## 2018-10-01 MED ORDER — BOOST / RESOURCE BREEZE PO LIQD CUSTOM
1.0000 | Freq: Three times a day (TID) | ORAL | Status: DC
  Administered 2018-10-02 – 2018-10-04 (×5): 1 via ORAL

## 2018-10-01 NOTE — Progress Notes (Addendum)
Patient ID: Omar Wood, male   DOB: 03/26/1961, 58 y.o.   MRN: [id number]  PROGRESS NOTE    Omar PancoastCarl D Wood  WUJ:[id number] DOB: 09/24/1960 DOA: 09/17/2018 PCP: System, Pcp Not In   Brief Narrative:  58 year old male with history of diabetes mellitus type 2, hypertension, hyperlipidemia, anxiety disorder/bipolar disorder, tobacco abuse, polysubstance abuse [facility]17/2018 unresponsive and was subsequently intubated.  Initial work-up showed negative UDS, lipase of 1371, glucose of 942, creatinine of 2.88, BAL was less than 10, WBC 27.  He was hypotensive on pressors.  He was [facility] under CCM care for further management.  He was admitted for DKA, acute metabolic encephalopathy, septic shock from pneumonia and respiratory failure.  After improvement and stabilization, care transferred [facility] on 09/25/2018.  Hospital course complicated by poor oral intake secondary to odynophagia, intermittent agitation and refusal of medication/care.  ID and EKG were consulted for oral pain/lip ulcers and odynophagia.  He underwent EGD on 09/30/2018.  Assessment & Plan:   Active Problems:   DKA (diabetic ketoacidoses) (HCC)   Shock (HCC)   Septic shock: Present on admission -Required pressors early on admission.  Sepsis and shock has resolved -Completed 7 total course of antibiotics.  Sputum grew group B strep and Klebsiella pneumoniae  Acute renal failure due to ATN -Resolved.  Creatinine normal.  Nephrology has signed off  Hypernatremia -Likely due to post ATN diuresis.  Sodium peaked to 4 and 52 -Treated with hypotonic fluids. -Resolved  Diabetes mellitus type 2/DKA -Patient presented with DKA. -Hemoglobin A1c on 09/11/2018 was 11.4 -Blood sugar still on the higher side.  Increase Levemir to 40 units daily.  Continue Accu-Cheks with coverage. -Metformin on hold. -Oral intake poor and not adding meal coverage.  Patient intermittently refusing medications including  insulin  Hypokalemia and hypomagnesemia -Replace.  Repeat a.m. labs  Prolonged QT -Keep potassium above 4 and magnesium above 2.  Acute respiratory failure with hypoxia -Currently on 2 L oxygen via nasal cannula.  Wean off as able.  Incentive spirometry. -CT chest on 09/30/2018 showed bilateral patchy airspace and interstitial infiltrates with small bilateral pleural effusions which could represent multifocal pneumonia.  Will start Augmentin  Acute diastolic CHF -Strict input output.  Daily weights.  Positive fluid balance of 5188.2 cc since admission. -Likely due to volume resuscitation for acute kidney injury -Echo showed EF of 60 to 65%. -We will also put him on scheduled Lasix 40 mg twice a day.  Essential hypertension -Blood pressure stable.  Continue amlodipine and Inderal.  Lisinopril on hold  Hyperlipidemia -Statins on hold because of abnormal LFTs.  Abnormal LFTs -Probably from septic shock.  AST and ALT peaked in the 700-800 range.  LFTs have almost normalized.  Right upper quadrant ultrasound unremarkable.  Anxiety disorder/bipolar disorder -Continue Cymbalta, Seroquel and trazodone as per prior home dose. -We will repeat EKG in a.m. -Patient is still intermittently refusing blood work, medications, insulin.  Will get psychiatry evaluation as well.  Dysphagia/odynophagia Moderate to severe reflux, candidiasis and erosive esophagitis -Status post EGD on 09/30/2018 by [facility].  Pathology pending. -Patient was placed on Magic mouthwash, brief Diflucan(discontinued due to prolonged QT) without much improvement.  Continue oral nystatin.  SLP and nutrition team following.  If unable to advance diet, might need either Cortrak or feeding tube but patient has been refusing treatment intermittently.  Lip/tongue lesions -Unclear etiology.  ID input appreciated.  Continue topical acyclovir to lip lesions: Not typical  for zoster.  Question of HSV.  White plaque on tongue: Question  leukoplakia, if does not improve, then follow-up with oral surgery/dermatology for biopsy consideration  Normocytic anemia Stable  Thrombocytopenia -Transient.  Resolved  Leukocytosis -Resolved  Acute encephalopathy -Present on admission.  Resolved.  Monitor mental status.  Still intermittently agitated and refuses treatment.  Follow psychiatric recommendations.  Tobacco/polysubstance abuse history -UDS negative on admission  Generalized deconditioning -PT/OT eval.  Will require SNF at discharge   DVT prophylaxis: Heparin Code Status: Full Family Communication: None at bedside Disposition Plan: SNF once clinically improved  Consultants: PCCM/ID/GI/nephrology  Procedures: Intubation/extubation EGD on 09/30/2018 Impression - Normal larynx. - Moderately severe reflux, candidiasis and erosive esophagitis. Biopsied. Cells for cytology obtained. - Small hiatal hernia. - Chronic gastritis. - Chronic duodenitis. - Normal second portion of the duodenum and third portion of the duodenum. - The examination was otherwise normal.  Recommendations - Clear liquid diet today. Slowly advance as tolerated trial of viscous lidocaine prior to eating - Continue present medications. - Await pathology results. - Return to GI clinic PRN. - Telephone GI clinic for pathology results in 1 week. Please call if we could be of any further assistance with this hospital stay - Telephone GI clinic if symptomatic PRN. Antimicrobials:  Rocephin 2/26 >>3/3 Zithromax 2/26 >> 2/27 Cefepime 2/28 >> 2/29 Diflucan-discontinued Oral nystatin  Subjective: Patient seen and examined at bedside.  He is awake, poor historian.  As per nursing staff, he has intermittently refused medications including insulin.  Patient feels lousy and complains of painful swallowing.  Objective: Vitals:   09/30/18 2159 10/01/18 0513 10/01/18 0521 10/01/18 1014  BP: (!) 162/81 133/77  (!) 143/84  Pulse: 87 (!) 106 (!)  102 (!) 102  Resp: 18 18    Temp: 98.2 F (36.8 C) 98 F (36.7 C)    TempSrc: Oral Oral    SpO2: 92% (!) 82% 94% 96%  Weight:  68.4 kg    Height:        Intake/Output Summary (Last 24 hours) at 10/01/2018 1444 Last data filed at 10/01/2018 1157 Gross per 24 hour  Intake 410.54 ml  Output 2000 ml  Net -1589.46 ml   Filed Weights   09/28/18 0331 09/30/18 0539 10/01/18 0513  Weight: 75.6 kg 70.1 kg 68.4 kg    Examination:  General exam: Appears calm and comfortable.  Appears older than stated age.  Awake but poor historian. Respiratory system: Bilateral decreased breath sounds at bases, scattered crackles Cardiovascular system: S1 & S2 heard, Rate controlled Gastrointestinal system: Abdomen is nondistended, soft and nontender. Normal bowel sounds heard. Extremities: No cyanosis, clubbing; trace edema   Data Reviewed: I have personally reviewed following labs and imaging studies  CBC: Recent Labs  Lab 09/25/18 0520 09/26/18 0425 10/01/18 0316  WBC 15.3* 10.5 8.7  HGB 9.5* 9.4* 11.2*  HCT 29.8* 30.7* 33.8*  MCV 92.8 94.2 87.1  PLT 377 365 360   Basic Metabolic Panel: Recent Labs  Lab 09/28/18 0546 09/29/18 0504 09/29/18 0809 09/30/18 0645 10/01/18 0316  NA 145 136 139 136 136  K 3.1* 6.1* 3.8 3.2* 2.9*  CL 105 103 99 96* 96*  CO2 30 20* 26 28 29   GLUCOSE 207* 165* 264* 279* 247*  BUN 23* 15 15 15 12   CREATININE 1.23 0.99 1.22 1.02 0.91  CALCIUM 7.3* 6.8* 7.1* 7.5* 7.6*  MG 1.4* QUANTITY NOT SUFFICIENT, UNABLE TO PERFORM TEST 1.6* 1.8 1.5*   GFR: Estimated Creatinine Clearance: 86.6 mL/min (  by C-G formula based on SCr of 0.91 mg/dL). Liver Function Tests: Recent Labs  Lab 09/27/18 0249 09/28/18 0546 09/29/18 0504 09/30/18 0645 10/01/18 0316  AST 39 23 73* 20 19  ALT 148* 103* QUANTITY NOT SUFFICIENT, UNABLE TO PERFORM TEST 58* 43  ALKPHOS 153* 132* 126 128* 115  BILITOT 1.0 1.2 QUANTITY NOT SUFFICIENT, UNABLE TO PERFORM TEST 0.8 0.6  PROT 5.1*  5.5* 5.8* 6.0* 6.1*  ALBUMIN 1.6* 1.8* 1.8* 2.0* 2.0*   No results for input(s): LIPASE, AMYLASE in the last 168 hours. No results for input(s): AMMONIA in the last 168 hours. Coagulation Profile: No results for input(s): INR, PROTIME in the last 168 hours. Cardiac Enzymes: Recent Labs  Lab 09/25/18 0520  CKTOTAL 60   BNP (last 3 results) No results for input(s): PROBNP in the last 8760 hours. HbA1C: No results for input(s): HGBA1C in the last 72 hours. CBG: Recent Labs  Lab 09/30/18 1142 09/30/18 1652 09/30/18 2155 10/01/18 0739 10/01/18 1149  GLUCAP 209* 155* 161* 417* 227*   Lipid Profile: No results for input(s): CHOL, HDL, LDLCALC, TRIG, CHOLHDL, LDLDIRECT in the last 72 hours. Thyroid Function Tests: No results for input(s): TSH, T4TOTAL, FREET4, T3FREE, THYROIDAB in the last 72 hours. Anemia Panel: No results for input(s): VITAMINB12, FOLATE, FERRITIN, TIBC, IRON, RETICCTPCT in the last 72 hours. Sepsis Labs: No results for input(s): PROCALCITON, LATICACIDVEN in the last 168 hours.  No results found for this or any previous visit (from the past 240 hour(s)).       Radiology Studies: Dg Ribs Unilateral Right  Result Date: 09/29/2018 CLINICAL DATA:  Right rib pain. EXAM: RIGHT RIBS - 2 VIEW COMPARISON:  Radiographs of September 28, 2018. FINDINGS: No fracture or other bone lesions are seen involving the ribs. IMPRESSION: Negative. Electronically Signed   By: Lupita Raider, M.D.   On: 09/29/2018 17:36   Ct Chest Wo Contrast  Result Date: 09/30/2018 CLINICAL DATA:  Chronic dyspnea. EXAM: CT CHEST WITHOUT CONTRAST TECHNIQUE: Multidetector CT imaging of the chest was performed following the standard protocol without IV contrast. COMPARISON:  02/18/2018 FINDINGS: Cardiovascular: Evaluation of vascular structures is limited without IV contrast material. Heart size is normal. No pericardial effusions. Normal caliber thoracic aorta. Mediastinum/Nodes: Scattered mediastinal  lymph nodes are not pathologically enlarged, likely reactive. Esophagus is decompressed. Suggestion of wall thickening in the distal esophagus as seen on prior study. This could be due to reflux disease or a mass lesion, as suggested previously. No evidence of proximal obstruction. Lungs/Pleura: Patchy airspace and interstitial infiltrates demonstrated throughout both lungs, new since prior CT. Small bilateral pleural effusions. Changes likely to represent multifocal pneumonia. No pneumothorax. Airways are patent. Upper Abdomen: No acute abnormalities. Musculoskeletal: No chest wall mass or suspicious bone lesions identified. Old right rib fractures. IMPRESSION: 1. Patchy airspace and interstitial infiltrates throughout both lungs, new since prior study. Small bilateral pleural effusions. Changes likely to represent multifocal pneumonia. 2. Suggestion of wall thickening in the distal esophagus. This could be due to reflux disease or mass lesion. Suggest correlation with endoscopy, as was also suggested in the previous study. Electronically Signed   By: Burman Nieves M.D.   On: 09/30/2018 22:05   Dg Chest Port 1 View  Result Date: 09/30/2018 CLINICAL DATA:  58 year old male with recent hypoxia and multifocal pneumonia versus edema. EXAM: PORTABLE CHEST 1 VIEW COMPARISON:  Chest radiographs 09/28/2018 and earlier. FINDINGS: Portable AP semi upright view at 1541 hours. Patchy and streaky bilateral pulmonary opacity is  stable since 09/28/2018 and more extensive in the left lung. No pneumothorax. No pleural effusion is evident. No areas of worsening ventilation. Stable cardiac size and mediastinal contours. Visualized tracheal air column is within normal limits. Chronic left clavicle fracture. No acute osseous abnormality identified. Negative visible bowel gas pattern IMPRESSION: Stable since 09/28/2018. Patchy and streaky bilateral pulmonary opacity. No new cardiopulmonary abnormality. Electronically Signed   By:  Odessa Fleming M.D.   On: 09/30/2018 15:59        Scheduled Meds: . acyclovir ointment   Topical Q3H  . amLODipine  5 mg Oral Daily  . DULoxetine  60 mg Oral BID  . feeding supplement  1 Container Oral TID BM  . heparin  5,000 Units Subcutaneous Q8H  . insulin aspart  0-15 Units Subcutaneous TID WC  . insulin aspart  0-5 Units Subcutaneous QHS  . insulin detemir  40 Units Subcutaneous Daily  . lidocaine  15 mL Mouth/Throat TID AC  . mouth rinse  15 mL Mouth Rinse q12n4p  . nystatin  5 mL Oral QID  . nystatin cream   Topical BID  . pantoprazole (PROTONIX) IV  40 mg Intravenous Q12H  . potassium chloride  40 mEq Oral Once  . propranolol  10 mg Oral TID  . QUEtiapine  100 mg Oral QHS  . traZODone  50 mg Oral QHS   Continuous Infusions:   LOS: 14 days        Glade Lloyd, MD [facility] 10/01/2018, 2:44 PM

## 2018-10-01 NOTE — Progress Notes (Signed)
PT Cancellation Note  Patient Details Name: Omar Wood MRN: [id number] DOB: Feb 26, 1961   Cancelled Treatment:    Reason Eval/Treat Not Completed: Pain limiting ability to participate   Adamantly refusing secondary to pain, but noted he declined po meds earlier (had arranged to premedicate for pain with Rodman Pickle, RN); Appreciate Rodman Pickle, RN's previous note;   At one point, he strongly indicated he didn't want any rehab; We may need to dc acute PT services if he refuses 3x per departmental protocols;   [name], [place]  Ac[facility] Services Pager [phone] Office [phone]    [name] 10/01/2018, 10:58 AM

## 2018-10-01 NOTE — Progress Notes (Signed)
  Echocardiogram 2D Echocardiogram has been performed.  Celene Skeen 10/01/2018, 9:59 AM

## 2018-10-01 NOTE — Progress Notes (Addendum)
Nutrition Follow-up  DOCUMENTATION CODES:   Non-severe (moderate) malnutrition in context of chronic illness  INTERVENTION:   - Boost Breeze po TID, each supplement provides 250 kcal and 9 grams of protein  - Monitor for diet advancement and adjust supplement regimen as appropriate  If pt continues to refuse food, recommend considering placement of Cortrak feeding tube and intiation of enteral nutrition if appropriate/GI approved as pt has erosive esophagitis.  If pt unable to tolerate Cortrak, next step would be considering TPN. However, unsure if pt would agree to either of these options. In that case, would recommend palliative care consult.  NUTRITION DIAGNOSIS:   Moderate Malnutrition related to chronic illness (poorly controlled DM, polysubstance abuse) as evidenced by mild fat depletion, mild muscle depletion.  Ongoing  GOAL:   Patient will meet greater than or equal to 90% of their needs  Unmet, pt refusing to eat and drink  MONITOR:   PO intake, Supplement acceptance, Diet advancement, Labs, Weight trends, I & O's, Other (GOC, TF vs TPN)  REASON FOR ASSESSMENT:   Ventilator    ASSESSMENT:   58 year old male w/ PMH of T2DM, HTN, and bipolar disorder. Presented to [facility] ER after being found unresponsive. Once he arrived to ER he was intubated. Admitted to ICU for DKA.   3/3 - extubated 3/5 - Dysphagia 2 with thin liquids 3/10 - clear liquids  EGD completed 3/10 showing moderately severe reflux, candidiasis and erosive esophagitis. Biopsied and cells for cytology obtained. Pt also with chronic gastritis and chronic duodenitis. Plan is to continue oral nystatin and trial viscous lidocaine prior to eating. However, noted that pt has been refusing viscous lidocaine.  Per nursing and therapy notes, pt continues to refuse care and medications. Discussed pt with RN who reports pt is refusing to eat and also refusing to take the medications prescribed to  alleviate his mouth pain.  Attempted to speak with pt at bedside but pt engaged with PT at time of visit. Pt raising his voice with PT and appeared agitated.  RD paged MD to discuss concern about inadequate nutrition and to discuss options for pt to meet his nutritional needs. First option includes placement of Cortrak feeding tube and initiation of enteral nutrition pending GI approval given erosive esophagitis. Next option would be to consider TPN. However, given pt's refusal of care, unsure whether pt would be amenable to either option. In that case, recommend palliative care team consult.  Meal Completion: 0-25% x last 8 recorded meals  Medications reviewed and include: SSI, Levemir 40 units daily, Nystatin, Protonix, K-dur 40 mEq once  Labs reviewed: potassium 2.9 (L), magnesium 1.5 (L), chloride 96 (L) CBG's: 227, 417, 161, 155 x 24 hours  UOP: 1550 ml x 24 hours I/O's: +5.1 L since admit  Diet Order:   Diet Order            Diet clear liquid Room service appropriate? Yes; Fluid consistency: Thin  Diet effective now              EDUCATION NEEDS:   No education needs have been identified at this time  Skin:  Skin Assessment: Reviewed RN Assessment  Last BM:  3/8  Height:   Ht Readings from Last 1 Encounters:  09/17/18 5' 11.65" (1.82 m)    Weight:   Wt Readings from Last 1 Encounters:  10/01/18 68.4 kg    Ideal Body Weight:  78.18 kg  BMI:  Body mass index is 20.65 kg/m.  Estimated Nutritional Needs:   Kcal:  2000-2200 kcals   Protein:  100-120 grams  Fluid:  >/= 2 L    Earma Reading, MS, RD, LDN Inpatient Clinical Dietitian Pager: [phone] Weekend/After Hours: [phone]

## 2018-10-01 NOTE — Progress Notes (Signed)
Patient's blood sugar this morning: 417. Dr. Hanley Ben paged and orders placed for 17 units of insulin. Patient agreeable to taking. Clear liquid tray has already been ordered for patient.  Patient agreeable to taking PO potassium this morning for a K+ of 2.9. Unable to administer IV magnesium due to loss of PIV access and patient's refusal for new site.   Despite education, patient refusing Lidocaine mouth solution, stating "I can't take that sh*t!" Will continue to monitor.  Lyndal Pulley, RN 10/01/2018 8:09 AM

## 2018-10-01 NOTE — Progress Notes (Signed)
Patient complaining of pain; 10/10. However, patient refused PIV access yesterday and morphine is unable to be given. Patient persistently refusing Tylenol and any other PO medications at this time. Talked patient into trying Lidocaine solution; patient drank it and said "I'll never take that sh*t again." Unable to give any medications besides levemir at this time. Will attempt later.  Lyndal Pulley, RN 10/01/2018 10:56 AM

## 2018-10-01 NOTE — Progress Notes (Signed)
SLP Cancellation Note  Patient Details Name: VADA LINHARDT MRN: [id number] DOB: February 21, 1961   Cancelled treatment:       Reason Eval/Treat Not Completed: Patient declined, no reason specified.  Pt continuing to refuse care, including PO intake.  Complained of pain all over, RN made aware.  Will continue efforts as appropriate   Maryjane Hurter 10/01/2018, 3:29 PM

## 2018-10-01 NOTE — Anesthesia Postprocedure Evaluation (Signed)
Anesthesia Post Note  Patient: Omar Wood  Procedure(s) Performed: ESOPHAGOGASTRODUODENOSCOPY (EGD) WITH PROPOFOL (N/A ) ESOPHAGEAL BRUSHING BIOPSY     Patient location during evaluation: PACU Anesthesia Type: MAC Level of consciousness: awake and alert Pain management: pain level controlled Vital Signs Assessment: post-procedure vital signs reviewed and stable Respiratory status: spontaneous breathing, nonlabored ventilation, respiratory function stable and patient connected to nasal cannula oxygen Cardiovascular status: stable and blood pressure returned to baseline Postop Assessment: no apparent nausea or vomiting Anesthetic complications: no    Last Vitals:  Vitals:   10/01/18 1014 10/01/18 1603  BP: (!) 143/84 133/80  Pulse: (!) 102 99  Resp:  16  Temp:  36.7 C  SpO2: 96% 93%    Last Pain:  Vitals:   10/01/18 1603  TempSrc: Oral  PainSc:                  [name]

## 2018-10-02 LAB — COMPREHENSIVE METABOLIC PANEL
ALT: 34 U/L (ref 0–44)
AST: 18 U/L (ref 15–41)
Albumin: 2 g/dL — ABNORMAL LOW (ref 3.5–5.0)
Alkaline Phosphatase: 102 U/L (ref 38–126)
Anion gap: 9 (ref 5–15)
BUN: 10 mg/dL (ref 6–20)
CO2: 28 mmol/L (ref 22–32)
CREATININE: 0.76 mg/dL (ref 0.61–1.24)
Calcium: 7.9 mg/dL — ABNORMAL LOW (ref 8.9–10.3)
Chloride: 100 mmol/L (ref 98–111)
GFR calc Af Amer: >60 mL/min (ref >60)
GFR calc non Af Amer: >60 mL/min (ref >60)
Glucose, Bld: 154 mg/dL — ABNORMAL HIGH (ref 70–99)
Potassium: 2.8 mmol/L — ABNORMAL LOW (ref 3.5–5.1)
Sodium: 137 mmol/L (ref 135–145)
Total Bilirubin: 0.5 mg/dL (ref 0.3–1.2)
Total Protein: 6.1 g/dL — ABNORMAL LOW (ref 6.5–8.1)

## 2018-10-02 LAB — CBC WITH DIFFERENTIAL/PLATELET
Abs Immature Granulocytes: 0.04 10*3/uL (ref 0.00–0.07)
Basophils Absolute: 0.1 10*3/uL (ref 0.0–0.1)
Basophils Relative: 1 %
Eosinophils Absolute: 0.1 10*3/uL (ref 0.0–0.5)
Eosinophils Relative: 1 %
HCT: 32.3 % — ABNORMAL LOW (ref 39.0–52.0)
HEMOGLOBIN: 10.9 g/dL — AB (ref 13.0–17.0)
Immature Granulocytes: 1 %
LYMPHS ABS: 2.1 10*3/uL (ref 0.7–4.0)
Lymphocytes Relative: 24 %
MCH: 29.2 pg (ref 26.0–34.0)
MCHC: 33.7 g/dL (ref 30.0–36.0)
MCV: 86.6 fL (ref 80.0–100.0)
MONOS PCT: 9 %
Monocytes Absolute: 0.7 10*3/uL (ref 0.1–1.0)
Neutro Abs: 5.6 10*3/uL (ref 1.7–7.7)
Neutrophils Relative %: 64 %
Platelets: 334 10*3/uL (ref 150–400)
RBC: 3.73 MIL/uL — ABNORMAL LOW (ref 4.22–5.81)
RDW: 12.5 % (ref 11.5–15.5)
WBC: 8.7 10*3/uL (ref 4.0–10.5)
nRBC: 0 % (ref 0.0–0.2)

## 2018-10-02 LAB — GLUCOSE, CAPILLARY
Glucose-Capillary: 218 mg/dL — ABNORMAL HIGH (ref 70–99)
Glucose-Capillary: 396 mg/dL — ABNORMAL HIGH (ref 70–99)
Glucose-Capillary: 247 mg/dL — ABNORMAL HIGH (ref 70–99)
Glucose-Capillary: 225 mg/dL — ABNORMAL HIGH (ref 70–99)

## 2018-10-02 LAB — MAGNESIUM: Magnesium: 1.7 mg/dL (ref 1.7–2.4)

## 2018-10-02 MED ORDER — PANTOPRAZOLE SODIUM 40 MG PO PACK
40.0000 mg | PACK | Freq: Two times a day (BID) | ORAL | Status: DC
  Filled 2018-10-02: qty 20

## 2018-10-02 MED ORDER — MAGNESIUM OXIDE 400 (241.3 MG) MG PO TABS
400.0000 mg | ORAL_TABLET | Freq: Three times a day (TID) | ORAL | Status: DC
  Filled 2018-10-02: qty 1

## 2018-10-02 MED ORDER — POTASSIUM CHLORIDE CRYS ER 20 MEQ PO TBCR
40.0000 meq | EXTENDED_RELEASE_TABLET | ORAL | Status: AC
  Filled 2018-10-02: qty 2

## 2018-10-02 NOTE — Progress Notes (Signed)
Patient ID: Omar Wood, male   DOB: [birth year], 58 y.o.   MRN: [id number]  PROGRESS NOTE    Omar Wood  NUU:[id number] DOB: 1961-04-28 DOA: 09/17/2018 PCP: System, Pcp Not In   Brief Narrative:  58 year old male with history of diabetes mellitus type 2, hypertension, hyperlipidemia, anxiety disorder/bipolar disorder, tobacco abuse, polysubstance abuse presented to [facility] ED on 09/17/2018 unresponsive and was subsequently intubated.  Initial work-up showed negative UDS, lipase of 1371, glucose of 942, creatinine of 2.88, BAL was less than 10, WBC 27.  He was hypotensive on pressors.  He was transferred to [facility] ICU under CCM care for further management.  He was admitted for DKA, acute metabolic encephalopathy, septic shock from pneumonia and respiratory failure.  After improvement and stabilization, care transferred to [facility] on 09/25/2018.  Hospital course complicated by poor oral intake secondary to odynophagia, intermittent agitation and refusal of medication/care.  ID and EKG were consulted for oral pain/lip ulcers and odynophagia.  He underwent EGD on 09/30/2018.  Assessment & Plan:   Active Problems:   DKA (diabetic ketoacidoses) (HCC)   Shock (HCC)   Septic shock: Present on admission -Required pressors early on admission.  Sepsis and shock has resolved -Completed 7 total course of antibiotics.  Sputum grew group B strep and Klebsiella pneumoniae  Acute renal failure due to ATN -Resolved.  Creatinine normal.  Nephrology has signed off  Hypernatremia -Likely due to post ATN diuresis.  Sodium peaked to 152 -Treated with hypotonic fluids. -Resolved  Diabetes mellitus type 2/DKA -Patient presented with DKA. -Hemoglobin A1c on 09/11/2018 was 11.4 -Continue Levemir.  Continue Accu-Cheks with coverage. -Metformin on hold. -Oral intake poor and not adding meal coverage.  Patient intermittently refusing medications including insulin  Hypokalemia -Replace.  Repeat a.m. labs   Hypomagnesemia -Improving.  Prolonged QT -Keep potassium above 4 and magnesium above 2. -Improving.  QT is 490 today.  Repeat a.m. EKG.  Acute respiratory failure with hypoxia -Currently on 2 L oxygen via nasal cannula.  Wean off as able.  Incentive spirometry. -CT chest on 09/30/2018 showed bilateral patchy airspace and interstitial infiltrates with small bilateral pleural effusions which could represent multifocal pneumonia.  Continue Augmentin.  Acute diastolic CHF -Strict input output.  Daily weights. -Likely due to volume resuscitation for acute kidney injury -Echo showed EF of 60 to 65%. -Continue Lasix 40 mg twice a day.  Monitor creatinine.  Essential hypertension -Blood pressure stable.  Continue amlodipine and Inderal.  Lisinopril on hold.  Continue Lasix.  Hyperlipidemia -Statins on hold because of abnormal LFTs.  Abnormal LFTs -Probably from septic shock.  AST and ALT peaked in the 700-800 range.  LFTs have normalized.  Right upper quadrant ultrasound unremarkable.  Anxiety disorder/bipolar disorder -Continue Cymbalta, Seroquel and trazodone as per prior home dose. -Patient is still intermittently refusing blood work, medications, insulin.   -Psychiatry evaluation is pending  Dysphagia/odynophagia Moderate to severe reflux, candidiasis and erosive esophagitis -Patient was placed on Magic mouthwash, brief Diflucan(discontinued due to prolonged QT) without much improvement.  Continue oral nystatin.   --Status post EGD on 09/30/2018 by [facility].  Pathology is negative for virus/yeast.  Continue Protonix. -SLP and nutrition team following.  If unable to advance diet, might need either Cortrak or feeding tube but patient has been refusing treatment intermittently.  Lip/tongue lesions -Unclear etiology.  ID input appreciated.  Continue topical acyclovir to lip lesions: Not typical for zoster.  Question of HSV.  White plaque on tongue: Question leukoplakia, if  does not  improve, then follow-up with oral surgery/dermatology for biopsy consideration  Normocytic anemia Stable  Thrombocytopenia -Transient.  Resolved  Leukocytosis -Resolved  Acute encephalopathy -Present on admission.  Resolved.  Monitor mental status.  Still intermittently agitated and refuses treatment.  Follow psychiatric recommendations.  Tobacco/polysubstance abuse history -UDS negative on admission  Generalized deconditioning -PT/OT eval.  Will require SNF at discharge   DVT prophylaxis: Heparin Code Status: Full Family Communication: None at bedside Disposition Plan: SNF once clinically improved  Consultants: PCCM/ID/GI/nephrology  Procedures: Intubation/extubation EGD on 09/30/2018 Impression - Normal larynx. - Moderately severe reflux, candidiasis and erosive esophagitis. Biopsied. Cells for cytology obtained. - Small hiatal hernia. - Chronic gastritis. - Chronic duodenitis. - Normal second portion of the duodenum and third portion of the duodenum. - The examination was otherwise normal.  Recommendations - Clear liquid diet today. Slowly advance as tolerated trial of viscous lidocaine prior to eating - Continue present medications. - Await pathology results. - Return to GI clinic PRN. - Telephone GI clinic for pathology results in 1 week. Please call if we could be of any further assistance with this hospital stay - Telephone GI clinic if symptomatic PRN. Antimicrobials:  Rocephin 2/26 >>3/3 Zithromax 2/26 >> 2/27 Cefepime 2/28 >> 2/29 Diflucan-discontinued Oral nystatin  Subjective: Patient seen and examined at bedside.  He is awake, poor historian.  Patient does not feel well.  He wants food.  As per nursing staff, he intermittently refuses treatments; had declined SLP evaluation yesterday. Objective: Vitals:   10/01/18 0521 10/01/18 1014 10/01/18 1603 10/01/18 2233  BP:  (!) 143/84 133/80 (!) 133/91  Pulse: (!) 102 (!) 102 99 97  Resp:   16 19   Temp:   98 F (36.7 C) 99.1 F (37.3 C)  TempSrc:   Oral Oral  SpO2: 94% 96% 93% 94%  Weight:      Height:        Intake/Output Summary (Last 24 hours) at 10/02/2018 1116 Last data filed at 10/01/2018 1513 Gross per 24 hour  Intake 50 ml  Output 400 ml  Net -350 ml   Filed Weights   09/28/18 0331 09/30/18 0539 10/01/18 0513  Weight: 75.6 kg 70.1 kg 68.4 kg    Examination:  General exam: Appears calm and comfortable.  Appears older than stated age.  Looks very deconditioned.  Awake but poor historian.  No acute distress Respiratory system: Bilateral decreased breath sounds at bases, scattered crackles.  No wheezing Cardiovascular system: S1 & S2 heard, Rate controlled Gastrointestinal system: Abdomen is nondistended, soft and nontender. Normal bowel sounds heard. Extremities: No cyanosis; trace edema   Data Reviewed: I have personally reviewed following labs and imaging studies  CBC: Recent Labs  Lab 09/26/18 0425 10/01/18 0316 10/02/18 0322  WBC 10.5 8.7 8.7  NEUTROABS  --   --  5.6  HGB 9.4* 11.2* 10.9*  HCT 30.7* 33.8* 32.3*  MCV 94.2 87.1 86.6  PLT 365 360 334   Basic Metabolic Panel: Recent Labs  Lab 09/29/18 0504 09/29/18 0809 09/30/18 0645 10/01/18 0316 10/02/18 0322  NA 136 139 136 136 137  K 6.1* 3.8 3.2* 2.9* 2.8*  CL 103 99 96* 96* 100  CO2 20* 26 28 29 28   GLUCOSE 165* 264* 279* 247* 154*  BUN 15 15 15 12 10   CREATININE 0.99 1.22 1.02 0.91 0.76  CALCIUM 6.8* 7.1* 7.5* 7.6* 7.9*  MG QUANTITY NOT SUFFICIENT, UNABLE TO PERFORM TEST 1.6* 1.8 1.5* 1.7   GFR: Estimated  Creatinine Clearance: 98.6 mL/min (by C-G formula based on SCr of 0.76 mg/dL). Liver Function Tests: Recent Labs  Lab 09/28/18 0546 09/29/18 0504 09/30/18 0645 10/01/18 0316 10/02/18 0322  AST 23 73* ALT 103* QUANTITY NOT SUFFICIENT, UNABLE TO PERFORM TEST 58* 43 34  ALKPHOS 132* 126 128* 115 102  BILITOT 1.2 QUANTITY NOT SUFFICIENT, UNABLE TO PERFORM TEST 0.8  0.6 0.5  PROT 5.5* 5.8* 6.0* 6.1* 6.1*  ALBUMIN 1.8* 1.8* 2.0* 2.0* 2.0*   No results for input(s): LIPASE, AMYLASE in the last 168 hours. No results for input(s): AMMONIA in the last 168 hours. Coagulation Profile: No results for input(s): INR, PROTIME in the last 168 hours. Cardiac Enzymes: No results for input(s): CKTOTAL, CKMB, CKMBINDEX, TROPONINI in the last 168 hours. BNP (last 3 results) No results for input(s): PROBNP in the last 8760 hours. HbA1C: No results for input(s): HGBA1C in the last 72 hours. CBG: Recent Labs  Lab 10/01/18 0739 10/01/18 1149 10/01/18 1605 10/01/18 2233 10/02/18 0813  GLUCAP 417* 227* 85 155* 218*   Lipid Profile: No results for input(s): CHOL, HDL, LDLCALC, TRIG, CHOLHDL, LDLDIRECT in the last 72 hours. Thyroid Function Tests: No results for input(s): TSH, T4TOTAL, FREET4, T3FREE, THYROIDAB in the last 72 hours. Anemia Panel: No results for input(s): VITAMINB12, FOLATE, FERRITIN, TIBC, IRON, RETICCTPCT in the last 72 hours. Sepsis Labs: No results for input(s): PROCALCITON, LATICACIDVEN in the last 168 hours.  No results found for this or any previous visit (from the past 240 hour(s)).       Radiology Studies: Ct Chest Wo Contrast  Result Date: 09/30/2018 CLINICAL DATA:  Chronic dyspnea. EXAM: CT CHEST WITHOUT CONTRAST TECHNIQUE: Multidetector CT imaging of the chest was performed following the standard protocol without IV contrast. COMPARISON:  02/18/2018 FINDINGS: Cardiovascular: Evaluation of vascular structures is limited without IV contrast material. Heart size is normal. No pericardial effusions. Normal caliber thoracic aorta. Mediastinum/Nodes: Scattered mediastinal lymph nodes are not pathologically enlarged, likely reactive. Esophagus is decompressed. Suggestion of wall thickening in the distal esophagus as seen on prior study. This could be due to reflux disease or a mass lesion, as suggested previously. No evidence of proximal  obstruction. Lungs/Pleura: Patchy airspace and interstitial infiltrates demonstrated throughout both lungs, new since prior CT. Small bilateral pleural effusions. Changes likely to represent multifocal pneumonia. No pneumothorax. Airways are patent. Upper Abdomen: No acute abnormalities. Musculoskeletal: No chest wall mass or suspicious bone lesions identified. Old right rib fractures. IMPRESSION: 1. Patchy airspace and interstitial infiltrates throughout both lungs, new since prior study. Small bilateral pleural effusions. Changes likely to represent multifocal pneumonia. 2. Suggestion of wall thickening in the distal esophagus. This could be due to reflux disease or mass lesion. Suggest correlation with endoscopy, as was also suggested in the previous study. Electronically Signed   By: Burman Nieves M.D.   On: 09/30/2018 22:05   Dg Chest Port 1 View  Result Date: 09/30/2018 CLINICAL DATA:  58 year old male with recent hypoxia and multifocal pneumonia versus edema. EXAM: PORTABLE CHEST 1 VIEW COMPARISON:  Chest radiographs 09/28/2018 and earlier. FINDINGS: Portable AP semi upright view at 1541 hours. Patchy and streaky bilateral pulmonary opacity is stable since 09/28/2018 and more extensive in the left lung. No pneumothorax. No pleural effusion is evident. No areas of worsening ventilation. Stable cardiac size and mediastinal contours. Visualized tracheal air column is within normal limits. Chronic left clavicle fracture. No acute osseous abnormality identified. Negative visible bowel gas pattern IMPRESSION: Stable  since 09/28/2018. Patchy and streaky bilateral pulmonary opacity. No new cardiopulmonary abnormality. Electronically Signed   By: Odessa FlemingH  Hall M.D.   On: 09/30/2018 15:59        Scheduled Meds: . acyclovir ointment   Topical Q3H  . amLODipine  5 mg Oral Daily  . amoxicillin-clavulanate  1 tablet Oral Q12H  . DULoxetine  60 mg Oral BID  . feeding supplement  1 Container Oral TID BM  .  furosemide  40 mg Oral BID  . heparin  5,000 Units Subcutaneous Q8H  . insulin aspart  0-15 Units Subcutaneous TID WC  . insulin aspart  0-5 Units Subcutaneous QHS  . insulin detemir  40 Units Subcutaneous Daily  . lidocaine  15 mL Mouth/Throat TID AC  . mouth rinse  15 mL Mouth Rinse q12n4p  . nystatin  5 mL Oral QID  . nystatin cream   Topical BID  . pantoprazole (PROTONIX) IV  40 mg Intravenous Q12H  . potassium chloride  40 mEq Oral Once  . potassium chloride  40 mEq Oral Q4H  . propranolol  10 mg Oral TID  . QUEtiapine  100 mg Oral QHS  . traZODone  50 mg Oral QHS   Continuous Infusions:   LOS: 15 days        Glade LloydKshitiz Maile Linford, MD [facility] 10/02/2018, 11:16 AM

## 2018-10-02 NOTE — Progress Notes (Signed)
PT Cancellation Note  Patient Details Name: Omar Wood MRN: [id number] DOB: [birth year]   Cancelled Treatment:    Reason Eval/Treat Not Completed: Patient declined, no reason specified.  Pt was adamant in his refusal to participate today.  Next refusal we will need to sign off due to guidelines. 10/02/2018  Clever Bing, PT Acute Rehabilitation Services [phone]  (pager) [phone]  (office)   Eliseo Gum Demetrica Zipp 10/02/2018, 4:13 PM

## 2018-10-02 NOTE — Plan of Care (Signed)
  Problem: Education: Goal: Knowledge of General Education information will improve Description Including pain rating scale, medication(s)/side effects and non-pharmacologic comfort measures Outcome: Progressing   Problem: Health Behavior/Discharge Planning: Goal: Ability to manage health-related needs will improve Outcome: Progressing   Problem: Clinical Measurements: Goal: Ability to maintain clinical measurements within normal limits will improve Outcome: Progressing Goal: Will remain free from infection Outcome: Progressing Goal: Diagnostic test results will improve Outcome: Progressing Goal: Respiratory complications will improve Outcome: Progressing Goal: Cardiovascular complication will be avoided Outcome: Progressing   Problem: Activity: Goal: Risk for activity intolerance will decrease Outcome: Progressing   Problem: Nutrition: Goal: Adequate nutrition will be maintained Outcome: Progressing   Problem: Coping: Goal: Level of anxiety will decrease Outcome: Progressing   Problem: Elimination: Goal: Will not experience complications related to bowel motility Outcome: Progressing Goal: Will not experience complications related to urinary retention Outcome: Progressing   Problem: Pain Managment: Goal: General experience of comfort will improve Outcome: Progressing   Problem: Safety: Goal: Ability to remain free from injury will improve Outcome: Progressing   Problem: Skin Integrity: Goal: Risk for impaired skin integrity will decrease Outcome: Progressing   Problem: Respiratory: Goal: Ability to maintain a clear airway and adequate ventilation will improve Outcome: Progressing   Problem: Role Relationship: Goal: Method of communication will improve Outcome: Progressing   Problem: Education: Goal: Ability to describe self-care measures that may prevent or decrease complications (Diabetes Survival Skills Education) will improve Outcome:  Progressing Goal: Individualized Educational Video(s) Outcome: Progressing   Problem: Cardiac: Goal: Ability to maintain an adequate cardiac output will improve Outcome: Progressing   Problem: Health Behavior/Discharge Planning: Goal: Ability to identify and utilize available resources and services will improve Outcome: Progressing

## 2018-10-02 NOTE — Consult Note (Signed)
Patient answered a couple questions and then refused to answer further questions to evaluate his capacity. He was able to state why he was admitted to the hospital and name his medical conditions. He became upset after this notewriter inquired about why he has been refusing his medications. He reports being able to tolerate insulin and IV Morphine since he does not have to swallow these medications and they do not upset his stomach. Unable to determine if he has capacity to refuse treatment since he did not participate in the full interview. He appears to selectively decide which medications he would like to take. Please reconsult psychiatry if further assistance is needed with patient.   Juanetta Beets, DO 10/02/18 3:20 PM

## 2018-10-02 NOTE — Progress Notes (Signed)
Pt alert and oriented to self and surrounding,. Pt refuse all morning medication and continues stating " I dont like the way the  medication makes me feel" pt stated that he only wants to take his pain medication. Pt in bed, calm and comfortable at this time.

## 2018-10-02 NOTE — Progress Notes (Signed)
Pt refused all medication. Pt would only take pain medications. Pt also refused vital signs and to be changed.

## 2018-10-02 NOTE — Progress Notes (Signed)
Biopsies returned no obvious viral or yeast probably just bad reflux or medicine induced let us know if we could be of any further assistance

## 2018-10-02 NOTE — Progress Notes (Signed)
  Speech Language Pathology Treatment: Dysphagia  Patient Details Name: Omar Wood MRN: [id number] DOB: 1961-04-22 Today's Date: 10/02/2018 Time: 4707-6151 SLP Time Calculation (min) (ACUTE ONLY): 8 min  Assessment / Plan / Recommendation Clinical Impression  Pt seen for limited treatment for dysphagia today.  Pt continues to refuse care but was encouraged to take several sips of water with therapist at bedside.  Pt had several empty bowls of Jello at bedside.  Pt consumed ~3 large sips of thin liquids via straw without overt s/s of aspiration despite suboptimal positioning.  SLP suspects that pt is regularly eating partially reclined or in supine since being hospitalized but he remains afebrile and lung sounds are documented as clear per chart review.  Pt is likely safe to resume solids once cleared by MD per EGD results (severe reflux, esophagitis).  Pt is resistant to any suggestions from therapist regarding even participating in therapies.  Will continue to follow along for now; however, if pt continues to refuse care ST may need to discharge pt from acute services until he demonstrates willingness to participate in therapies and comply with recommendations.  HPI HPI: Pt is a 58 yo male smoker transferred [facility] 2/26 with PMH of DM, Bipolar, Anxiety, Polysubstance abuse, HTN and HLD presenting with DKA with altered mental status, hypotension, and respiratory failure. ETT:  2/26-3/3. Reported by MD to be coughing on ice chips post extubation.      SLP Plan  Continue with current plan of care       Recommendations  Diet recommendations: Thin liquid Liquids provided via: Cup;Straw Medication Administration: Other (Comment)(as tolerated) Supervision: Patient able to self feed;Intermittent supervision to cue for compensatory strategies Compensations: Slow rate;Small sips/bites Postural Changes and/or Swallow Maneuvers: Seated upright 90 degrees                Oral Care  Recommendations: Oral care BID Follow up Recommendations: 24 hour supervision/assistance;Skilled Nursing facility SLP Visit Diagnosis: Dysphagia, pharyngoesophageal phase (R13.14) Plan: Continue with current plan of care       GO                Aceyn Kathol, Melanee Spry 10/02/2018, 11:49 AM

## 2018-10-03 DIAGNOSIS — E876 Hypokalemia: Secondary | ICD-10-CM

## 2018-10-03 LAB — BASIC METABOLIC PANEL
ANION GAP: 14 (ref 5–15)
BUN: 12 mg/dL (ref 6–20)
CO2: 28 mmol/L (ref 22–32)
Calcium: 7.8 mg/dL — ABNORMAL LOW (ref 8.9–10.3)
Chloride: 93 mmol/L — ABNORMAL LOW (ref 98–111)
Creatinine, Ser: 1.01 mg/dL (ref 0.61–1.24)
GFR calc Af Amer: >60 mL/min (ref >60)
GFR calc non Af Amer: >60 mL/min (ref >60)
Glucose, Bld: 339 mg/dL — ABNORMAL HIGH (ref 70–99)
Potassium: 2.7 mmol/L — CL (ref 3.5–5.1)
Sodium: 135 mmol/L (ref 135–145)

## 2018-10-03 LAB — GLUCOSE, CAPILLARY
GLUCOSE-CAPILLARY: 328 mg/dL — AB (ref 70–99)
Glucose-Capillary: 541 mg/dL (ref 70–99)
Glucose-Capillary: 83 mg/dL (ref 70–99)
Glucose-Capillary: 146 mg/dL — ABNORMAL HIGH (ref 70–99)

## 2018-10-03 LAB — MAGNESIUM: Magnesium: 1.2 mg/dL — ABNORMAL LOW (ref 1.7–2.4)

## 2018-10-03 MED ORDER — MORPHINE SULFATE (PF) 2 MG/ML IV SOLN: 0.5000 mg | INTRAVENOUS | Status: DC | PRN

## 2018-10-03 MED ORDER — PANTOPRAZOLE SODIUM 40 MG IV SOLR
40.0000 mg | Freq: Two times a day (BID) | INTRAVENOUS | Status: DC
  Administered 2018-10-03 – 2018-10-04 (×3): 40 mg via INTRAVENOUS
  Filled 2018-10-03 (×3): qty 40

## 2018-10-03 MED ORDER — INSULIN DETEMIR 100 UNIT/ML ~~LOC~~ SOLN
45.0000 [IU] | Freq: Every day | SUBCUTANEOUS | Status: DC
  Administered 2018-10-04: 45 [IU] via SUBCUTANEOUS
  Filled 2018-10-03: qty 0.45

## 2018-10-03 MED ORDER — MAGNESIUM OXIDE 400 (241.3 MG) MG PO TABS: 800.0000 mg | ORAL_TABLET | Freq: Two times a day (BID) | ORAL | Status: DC

## 2018-10-03 MED ORDER — INSULIN ASPART 100 UNIT/ML ~~LOC~~ SOLN
5.0000 [IU] | Freq: Three times a day (TID) | SUBCUTANEOUS | Status: DC
  Administered 2018-10-03 – 2018-10-04 (×4): 5 [IU] via SUBCUTANEOUS

## 2018-10-03 MED ORDER — POTASSIUM CHLORIDE 10 MEQ/100ML IV SOLN: 10.0000 meq | INTRAVENOUS | Status: AC

## 2018-10-03 MED ORDER — POTASSIUM CHLORIDE 10 MEQ/100ML IV SOLN
10.0000 meq | INTRAVENOUS | Status: AC
  Administered 2018-10-03 (×6): 10 meq via INTRAVENOUS
  Filled 2018-10-03 (×6): qty 100

## 2018-10-03 MED ORDER — CHLORHEXIDINE GLUCONATE 0.12 % MT SOLN
15.0000 mL | Freq: Two times a day (BID) | OROMUCOSAL | Status: DC
  Administered 2018-10-04: 15 mL via OROMUCOSAL
  Filled 2018-10-03: qty 15

## 2018-10-03 MED ORDER — KETOROLAC TROMETHAMINE 15 MG/ML IJ SOLN
15.0000 mg | Freq: Four times a day (QID) | INTRAMUSCULAR | Status: DC | PRN
  Administered 2018-10-03: 15 mg via INTRAVENOUS
  Filled 2018-10-03: qty 1

## 2018-10-03 MED ORDER — ORAL CARE MOUTH RINSE: 15.0000 mL | Freq: Two times a day (BID) | OROMUCOSAL | Status: DC

## 2018-10-03 MED ORDER — ALUM & MAG HYDROXIDE-SIMETH 200-200-20 MG/5ML PO SUSP
30.0000 mL | Freq: Four times a day (QID) | ORAL | Status: DC | PRN
  Administered 2018-10-03 – 2018-10-04 (×2): 30 mL via ORAL
  Filled 2018-10-03 (×2): qty 30

## 2018-10-03 MED ORDER — POTASSIUM CHLORIDE CRYS ER 20 MEQ PO TBCR
40.0000 meq | EXTENDED_RELEASE_TABLET | ORAL | Status: DC
  Filled 2018-10-03: qty 2

## 2018-10-03 MED ORDER — POTASSIUM CHLORIDE CRYS ER 20 MEQ PO TBCR: 40.0000 meq | EXTENDED_RELEASE_TABLET | Freq: Two times a day (BID) | ORAL | Status: DC

## 2018-10-03 MED ORDER — FUROSEMIDE 40 MG PO TABS
40.0000 mg | ORAL_TABLET | Freq: Every day | ORAL | Status: DC
  Administered 2018-10-04: 40 mg via ORAL
  Filled 2018-10-03: qty 1

## 2018-10-03 NOTE — Progress Notes (Signed)
PT Cancellation Note  Patient Details Name: Omar Wood MRN: [id number] DOB: 12/24/1960   Cancelled Treatment:    Reason Eval/Treat Not Completed: Patient declined, no reason specified.  Pt has declined therapy for 4 days in a row, fairly vehemently.  I discussed this with him and he understands we have to sign off from his case on acute at this time. 10/03/2018  [place] Bing, PT Acute Rehabilitation Services [phone]  (pager) [phone]  (office)   Omar Wood 10/03/2018, 5:37 PM
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
## 2018-10-03 NOTE — Progress Notes (Signed)
Pt received six units of IV potassium. Pt in bed and  Stable at this time. Pt last two units of IV potassium schedule for night shift to administer.

## 2018-10-03 NOTE — Progress Notes (Signed)
Patient ID: Omar Wood, male   DOB: 1961/03/28, 58 y.o.   MRN: [id number]  PROGRESS NOTE    Omar Wood  WVP:[id number] DOB: 1961-04-02 DOA: 09/17/2018 PCP: System, Pcp Not In   Brief Narrative:  58 year old male with history of diabetes mellitus type 2, hypertension, hyperlipidemia, anxiety disorder/bipolar disorder, tobacco abuse, polysubstance abuse presented to [facility] ED on 09/17/2018 unresponsive and was subsequently intubated.  Initial work-up showed negative UDS, lipase of 1371, glucose of 942, creatinine of 2.88, BAL was less than 10, WBC 27.  He was hypotensive on pressors.  He was transferred to [facility] ICU under CCM care for further management.  He was admitted for DKA, acute metabolic encephalopathy, septic shock from pneumonia and respiratory failure.  After improvement and stabilization, care transferred to [facility] on 09/25/2018.  Hospital course complicated by poor oral intake secondary to odynophagia, intermittent agitation and refusal of medication/care.  ID and EKG were consulted for oral pain/lip ulcers and odynophagia.  He underwent EGD on 09/30/2018.  Patient has been intermittently refusing medications, blood work, evaluation by SLP.  He also refused psychiatric evaluation.  Assessment & Plan:   Active Problems:   DKA (diabetic ketoacidoses) (HCC)   Shock (HCC)   Septic shock: Present on admission -Required pressors early on admission.  Sepsis and shock has resolved -Completed 7 total course of antibiotics.  Sputum grew group B strep and Klebsiella pneumoniae  Acute renal failure due to ATN -Resolved.  Creatinine normal.  Nephrology has signed off  Hypernatremia -Likely due to post ATN diuresis.  Sodium peaked to 152 -Treated with hypotonic fluids. -Resolved  Diabetes mellitus type 2/DKA -Patient presented with DKA. -Hemoglobin A1c on 09/11/2018 was 11.4 -Blood sugar still on the higher side.  Increase Levemir to 45 units daily.  Add NovoLog with meals if patient is  taking oral meals.  Continue Accu-Cheks with coverage. -Metformin on hold.  Hypokalemia -Replace.  Repeat a.m. labs.  Patient refuses to take oral pills.  Will try and replace intravenously.  He also intermittently refused IV potassium supplementation because of burning.  Hypomagnesemia -Replace IV as patient refuses orally.  Prolonged QT -Keep potassium above 4 and magnesium above 2. -Improving.  Repeat a.m. EKG.  Acute respiratory failure with hypoxia -Currently on 2 L oxygen via nasal cannula.  Wean off as able.  Incentive spirometry. -CT chest on 09/30/2018 showed bilateral patchy airspace and interstitial infiltrates with small bilateral pleural effusions which could represent multifocal pneumonia.  Patient was started on Augmentin but he has refused to take Augmentin.  Currently on room air.  Respiratory status stable.  Doubt that patient has bacterial pneumonia.  Will hold off on any further antibiotics.  Acute diastolic CHF -Strict input output.  Daily weights. -Likely due to volume resuscitation for acute kidney injury -Echo showed EF of 60 to 65%. -Currently on Lasix orally which patient takes only intermittently.  Decrease the dose to once a day because of hypokalemia and reassess tomorrow.  Essential hypertension -Blood pressure stable.  Continue amlodipine and Inderal.  Lisinopril on hold.  Continue Lasix as above.  Hyperlipidemia -Statins on hold because of abnormal LFTs.  Abnormal LFTs -Probably from septic shock.  AST and ALT peaked in the 700-800 range.  LFTs have normalized.  Right upper quadrant ultrasound unremarkable.  Anxiety disorder/bipolar disorder -Cymbalta, Seroquel and trazodone have been ordered but patient has not taken them and refuses them.-Patient is still intermittently refusing blood work, medications, insulin.   -Patient refused psychiatric evaluation on  10/02/2018.  Will need outpatient psychiatric evaluation.  Dysphagia/odynophagia Moderate to  severe reflux, candidiasis and erosive esophagitis -Patient was placed on Magic mouthwash, brief Diflucan(discontinued due to prolonged QT) without much improvement.  Continue oral nystatin.   --Status post EGD on 09/30/2018 by [facility].  Pathology is negative for virus/yeast.  Continue Protonix, will change to IV. -SLP and nutrition team following.  If unable to advance diet, might need either Cortrak or feeding tube but patient has been refusing treatment intermittently.  Patient does not want feeding tube.  Lip/tongue lesions -Unclear etiology.  ID input appreciated.  Continue topical acyclovir to lip lesions: Not typical for zoster.  Question of HSV.  White plaque on tongue: Question leukoplakia, if does not improve, then follow-up with oral surgery/dermatology for biopsy consideration  Normocytic anemia Stable  Thrombocytopenia -Transient.  Resolved  Leukocytosis -Resolved  Acute encephalopathy -Present on admission.  Resolved.  Monitor mental status.  Still intermittently agitated and refuses treatment.  Follow psychiatric recommendations.  Tobacco/polysubstance abuse history -UDS negative on admission  Generalized deconditioning -PT/OT eval.  Will require SNF at discharge   DVT prophylaxis: Heparin Code Status: Full Family Communication: None at bedside Disposition Plan: SNF once clinically improved  Consultants: PCCM/ID/GI/nephrology  Procedures: Intubation/extubation EGD on 09/30/2018 Impression - Normal larynx. - Moderately severe reflux, candidiasis and erosive esophagitis. Biopsied. Cells for cytology obtained. - Small hiatal hernia. - Chronic gastritis. - Chronic duodenitis. - Normal second portion of the duodenum and third portion of the duodenum. - The examination was otherwise normal.  Recommendations - Clear liquid diet today. Slowly advance as tolerated trial of viscous lidocaine prior to eating - Continue present medications. - Await pathology  results. - Return to GI clinic PRN. - Telephone GI clinic for pathology results in 1 week. Please call if we could be of any further assistance with this hospital stay - Telephone GI clinic if symptomatic PRN.  Antimicrobials:  Rocephin 2/26 >>3/3 Zithromax 2/26 >> 2/27 Cefepime 2/28 >> 2/29 Diflucan-discontinued Oral nystatin  Subjective: Patient seen and examined at bedside.  He is awake, poor historian.  He feels lousy, thinks that he is on a die.  I reinforced to him the need for taking medications as prescribed and working with PT/SLP.  No overnight fever or vomiting reported.   Objective: Vitals:   10/01/18 2233 10/02/18 1402 10/02/18 2108 10/03/18 0541  BP: (!) 133/91 115/89 117/84 125/78  Pulse: 97 99 100 (!) 115  Resp: Temp:  98.2 F (36.8 C) 97.7 F (36.5 C) 98 F (36.7 C)  TempSrc: Oral Oral Oral Oral  SpO2: 94% 98% 96% 94%  Weight:      Height:        Intake/Output Summary (Last 24 hours) at 10/03/2018 0936 Last data filed at 10/03/2018 1610 Gross per 24 hour  Intake 711 ml  Output 1000 ml  Net -289 ml   Filed Weights   09/28/18 0331 09/30/18 0539 10/01/18 0513  Weight: 75.6 kg 70.1 kg 68.4 kg    Examination:  General exam:  Appears older than stated age.  Looks very deconditioned.  Awake but poor historian.  No distress.   Respiratory system: Bilateral decreased breath sounds at bases, scattered crackles.  Cardiovascular system: Rate controlled, S1-S2 heard Gastrointestinal system: Abdomen is nondistended, soft and nontender. Normal bowel sounds heard. Extremities: No cyanosis; trace edema   Data Reviewed: I have personally reviewed following labs and imaging studies  CBC: Recent Labs  Lab 10/01/18 0316  10/02/18 0322  WBC 8.7 8.7  NEUTROABS  --  5.6  HGB 11.2* 10.9*  HCT 33.8* 32.3*  MCV 87.1 86.6  PLT 360 334   Basic Metabolic Panel: Recent Labs  Lab 09/29/18 0809 09/30/18 0645 10/01/18 0316 10/02/18 0322 10/03/18 0407   NA 139 136 136 137 135  K 3.8 3.2* 2.9* 2.8* 2.7*  CL 99 96* 96* 100 93*  CO2 26 28 29 28 28   GLUCOSE 264* 279* 247* 154* 339*  BUN 15 15 12 10 12   CREATININE 1.22 1.02 0.91 0.76 1.01  CALCIUM 7.1* 7.5* 7.6* 7.9* 7.8*  MG 1.6* 1.8 1.5* 1.7 1.2*   GFR: Estimated Creatinine Clearance: 78.1 mL/min (by C-G formula based on SCr of 1.01 mg/dL). Liver Function Tests: Recent Labs  Lab 09/28/18 0546 09/29/18 0504 09/30/18 0645 10/01/18 0316 10/02/18 0322  AST 23 73* 20 19 18   ALT 103* QUANTITY NOT SUFFICIENT, UNABLE TO PERFORM TEST 58* 43 34  ALKPHOS 132* 126 128* 115 102  BILITOT 1.2 QUANTITY NOT SUFFICIENT, UNABLE TO PERFORM TEST 0.8 0.6 0.5  PROT 5.5* 5.8* 6.0* 6.1* 6.1*  ALBUMIN 1.8* 1.8* 2.0* 2.0* 2.0*   No results for input(s): LIPASE, AMYLASE in the last 168 hours. No results for input(s): AMMONIA in the last 168 hours. Coagulation Profile: No results for input(s): INR, PROTIME in the last 168 hours. Cardiac Enzymes: No results for input(s): CKTOTAL, CKMB, CKMBINDEX, TROPONINI in the last 168 hours. BNP (last 3 results) No results for input(s): PROBNP in the last 8760 hours. HbA1C: No results for input(s): HGBA1C in the last 72 hours. CBG: Recent Labs  Lab 10/02/18 1211 10/02/18 1647 10/02/18 2106 10/03/18 0753 10/03/18 0758  GLUCAP 396* 247* 225* 577* 541*   Lipid Profile: No results for input(s): CHOL, HDL, LDLCALC, TRIG, CHOLHDL, LDLDIRECT in the last 72 hours. Thyroid Function Tests: No results for input(s): TSH, T4TOTAL, FREET4, T3FREE, THYROIDAB in the last 72 hours. Anemia Panel: No results for input(s): VITAMINB12, FOLATE, FERRITIN, TIBC, IRON, RETICCTPCT in the last 72 hours. Sepsis Labs: No results for input(s): PROCALCITON, LATICACIDVEN in the last 168 hours.  No results found for this or any previous visit (from the past 240 hour(s)).       Radiology Studies: No results found.      Scheduled Meds: . acyclovir ointment   Topical Q3H  .  amLODipine  5 mg Oral Daily  . amoxicillin-clavulanate  1 tablet Oral Q12H  . chlorhexidine  15 mL Mouth Rinse BID  . DULoxetine  60 mg Oral BID  . feeding supplement  1 Container Oral TID BM  . furosemide  40 mg Oral BID  . heparin  5,000 Units Subcutaneous Q8H  . insulin aspart  0-15 Units Subcutaneous TID WC  . insulin aspart  0-5 Units Subcutaneous QHS  . insulin detemir  40 Units Subcutaneous Daily  . lidocaine  15 mL Mouth/Throat TID AC  . mouth rinse  15 mL Mouth Rinse q12n4p  . mouth rinse  15 mL Mouth Rinse q12n4p  . nystatin  5 mL Oral QID  . nystatin cream   Topical BID  . pantoprazole (PROTONIX) IV  40 mg Intravenous Q12H  . propranolol  10 mg Oral TID  . QUEtiapine  100 mg Oral QHS  . traZODone  50 mg Oral QHS   Continuous Infusions: . potassium chloride       LOS: 16 days        Glade Lloyd, MD [facility] 10/03/2018, 9:36  AM

## 2018-10-03 NOTE — Progress Notes (Signed)
Patient refused all medications, except for heparin injection, insulin injection, IV pain medicine, and boost supplement.  Stated "I can't take no pills."  Will continue to monitor.

## 2018-10-03 NOTE — Progress Notes (Signed)
Inpatient Diabetes Program Recommendations  AACE/ADA: New Consensus Statement on Inpatient Glycemic Control (2015)  Target Ranges:  Prepandial:   less than 140 mg/dL      Peak postprandial:   less than 180 mg/dL (1-2 hours)      Critically ill patients:  140 - 180 mg/dL   Lab Results  Component Value Date   GLUCAP 541 (HH) 10/03/2018   HGBA1C 11.4 (H) 09/19/2018    Review of Glycemic Control Results for Omar Wood, Omar Wood (MRN [id number]) as of 10/03/2018 11:19  Ref. Range 10/02/2018 12:11 10/02/2018 16:47 10/02/2018 21:06 10/03/2018 07:53 10/03/2018 07:58  Glucose-Capillary Latest Ref Range: 70 - 99 mg/dL 956 (H) 387 (H) 564 (H) 577 (HH) 541 (HH)   Diabetes history: DM 2 Outpatient Diabetes medications: Basaglar 40 units qhs, Humalog 15 units tid meal coverage, Metformin 1000 mg BID Current orders for Inpatient glycemic control: Levemir 45 units Daily, Novolog 0-15 units tid, Novolog 0-5 units qhs  Inpatient Diabetes Program Recommendations:  Noted CBGs elevated and last CBG 541.Last noted BMET was this am. -Increase Novolog correction to q 4 hrs. -Add Novolog 5 units tid meal coverage if eats 50%  Thank you, Darel Hong E. Mirakle Tomlin, RN, MSN, CDE  Diabetes Coordinator Inpatient Glycemic Control Team Team Pager [phone] (8am-5pm) 10/03/2018 11:24 AM

## 2018-10-03 NOTE — Progress Notes (Signed)
Pt in the room at this time continues to refuse all medication. Pt refuse oral potasium, states" I dont like the way it makes me feel". IV potasium order and given at a slower rate for pt to tolerate. Pt continues to be educated to take his medication, MD is aware and has spoken to pt.will continue to monitor.

## 2018-10-03 NOTE — Progress Notes (Signed)
Pt. IV was beeping.  As I approached his room to fix IV pump I heard banging and screaming.  I came into room pt had knocked over IV pole.  Was punching the pump.  He knocked over his tray of food all over the floor and knocked over his urinal spilling urine all over room.  He is now refusing last 2 runs of potassium.

## 2018-10-04 DIAGNOSIS — F319 Bipolar disorder, unspecified: Secondary | ICD-10-CM

## 2018-10-04 LAB — BASIC METABOLIC PANEL
Anion gap: 12 (ref 5–15)
BUN: 10 mg/dL (ref 6–20)
CO2: 25 mmol/L (ref 22–32)
CREATININE: 0.77 mg/dL (ref 0.61–1.24)
Calcium: 7.8 mg/dL — ABNORMAL LOW (ref 8.9–10.3)
Chloride: 98 mmol/L (ref 98–111)
GFR calc Af Amer: >60 mL/min (ref >60)
GFR calc non Af Amer: >60 mL/min (ref >60)
Glucose, Bld: 284 mg/dL — ABNORMAL HIGH (ref 70–99)
Potassium: 3.3 mmol/L — ABNORMAL LOW (ref 3.5–5.1)
Sodium: 135 mmol/L (ref 135–145)

## 2018-10-04 LAB — GLUCOSE, CAPILLARY
Glucose-Capillary: 317 mg/dL — ABNORMAL HIGH (ref 70–99)
Glucose-Capillary: 151 mg/dL — ABNORMAL HIGH (ref 70–99)

## 2018-10-04 MED ORDER — ALUM & MAG HYDROXIDE-SIMETH 200-200-20 MG/5ML PO SUSP: 30.0000 mL | Freq: Four times a day (QID) | ORAL | 0 refills | Status: AC | PRN

## 2018-10-04 MED ORDER — NYSTATIN 100000 UNIT/GM EX CREA: TOPICAL_CREAM | Freq: Two times a day (BID) | CUTANEOUS | 0 refills | Status: AC

## 2018-10-04 MED ORDER — AMLODIPINE BESYLATE 5 MG PO TABS: 5.0000 mg | ORAL_TABLET | Freq: Every day | ORAL | 0 refills | Status: AC

## 2018-10-04 MED ORDER — ESOMEPRAZOLE MAGNESIUM 40 MG PO CPDR: 40.0000 mg | DELAYED_RELEASE_CAPSULE | Freq: Two times a day (BID) | ORAL | 0 refills | Status: AC

## 2018-10-04 MED ORDER — BASAGLAR KWIKPEN 100 UNIT/ML ~~LOC~~ SOPN: 45.0000 [IU] | PEN_INJECTOR | Freq: Every day | SUBCUTANEOUS | Status: DC

## 2018-10-04 MED ORDER — ONDANSETRON 4 MG PO TBDP: 4.0000 mg | ORAL_TABLET | Freq: Four times a day (QID) | ORAL | 0 refills | Status: DC | PRN

## 2018-10-04 NOTE — Progress Notes (Signed)
Patient refused to walk thi AM and also, he refused some of his morning medicine. Will continue to monitor.

## 2018-10-04 NOTE — Progress Notes (Signed)
Patient was discharged home by MD order; discharged instructions review and give to patient and his sister with care notes; IV DIC;  patient will be escorted to the car by nurse tech via wheelchair.  

## 2018-10-04 NOTE — Progress Notes (Signed)
Patient's sister at bedside requested to talk with patient's doctor. MD made aware.

## 2018-10-04 NOTE — TOC Transition Note (Addendum)
Transition of Care [facility]) - CM/SW Discharge Note   Patient Details  Name: Omar Wood MRN: [id number] Date of Birth: [birth year]  [facility] CM/SW Contact:  Deveron Furlong, RN Phone Number: 10/04/2018, 1:38 PM   Final next level of care: Home/Self Care Barriers to Discharge: Barriers Resolved(Patient refused SNF)   Discharge Placement  Patient refused home health and SNF services.  Pt states he does not need DME.  Pt states has no transportation and does not want to leave today.  Pt cannot be given cab voucher because lives in [place].     Discharge Plan and Services   Post Acute Care Choice: NA          DME Arranged: N/A DME Agency: NA HH Arranged: NA, Refused SNF HH Agency: NA     Readmission Risk Interventions Readmission Risk Prevention Plan 10/03/2018  Transportation Screening Patient refused  PCP or Specialist Appt within 5-7 Days Patient refused  Home Care Screening Patient refused  Medication Review (RN CM) Patient Refused  Some recent data might be hidden    Addendum 1430: pt's sister has arrived at bedside and is aware of d/c.  Sister will need to provide transport.

## 2018-10-04 NOTE — Discharge Summary (Signed)
Physician Discharge Summary  Omar Wood:[id number] DOB: 03/12/1961 DOA: 09/17/2018  PCP: System, Pcp Not In  Admit date: 09/17/2018 Discharge date: 10/04/2018  Admitted From: Home Disposition: Home  Recommendations for Outpatient Follow-up:  1. Follow up with PCP in 1 week with repeat CBC/BMP 2. Outpatient follow-up with GI/cardiology/psychiatry 3. Patient is to be compliant with medications and follow-up 4. Follow up in ED if symptoms worsen or new appear   Home Health: Home health PT/OT/RN.  Patient refused SNF placement. Equipment/Devices: None  Discharge Condition: Guarded to poor CODE STATUS: Full Diet recommendation: As per SLP recommendations/heart healthy/carb modified  Brief/Interim Summary: 58 year old male with history of diabetes mellitus type 2, hypertension, hyperlipidemia, anxiety disorder/bipolar disorder, tobacco abuse, polysubstance abuse presented to [facility] ED on 09/17/2018 unresponsive and was subsequently intubated.  Initial work-up showed negative UDS, lipase of 1371, glucose of 942, creatinine of 2.88, BAL was less than 10, WBC 27.  He was hypotensive on pressors.  He was transferred to [facility] ICU under CCM care for further management.  He was admitted for DKA, acute metabolic encephalopathy, septic shock from pneumonia and respiratory failure.  After improvement and stabilization, care transferred to [facility] on 09/25/2018.  Hospital course complicated by poor oral intake secondary to odynophagia, intermittent agitation and refusal of medication/care.  ID and EKG were consulted for oral pain/lip ulcers and odynophagia.  He underwent EGD on 09/30/2018.  Patient has been intermittently refusing medications, blood work, evaluation by SLP.  He also refused psychiatric evaluation. He is still refusing various medications.  Refuses to get out of bed or be seen by  physical therapy.  He has refused SNF placement.  He will be discharged home with home health if he  agrees.   Discharge Diagnoses:  Active Problems:   DKA (diabetic ketoacidoses) (HCC)   Shock (HCC)  Septic shock: Present on admission -Required pressors early on admission.  Sepsis and shock has resolved -Completed 7 total course of antibiotics.  Sputum grew group B strep and Klebsiella pneumoniae  Acute renal failure due to ATN -Resolved.  Creatinine normal.  Nephrology has signed off  Hypernatremia -Likely due to post ATN diuresis.  Sodium peaked to 152 -Treated with hypotonic fluids. -Resolved  Diabetes mellitus type 2/DKA -Patient presented with DKA. -Hemoglobin A1c on 09/11/2018 was 11.4 -Patient has been intermittently refusing insulin.  Will discharge on 45 units of long-acting insulin.  Metformin discontinued.  Outpatient follow-up with PCP    Prolonged QT -Improving.  Outpatient follow-up  Acute respiratory failure with hypoxia -Resolved.  Currently on room air. -CT chest on 09/30/2018 showed bilateral patchy airspace and interstitial infiltrates with small bilateral pleural effusions which could represent multifocal pneumonia.  Patient was started on Augmentin but he  refused to take Augmentin.  Respiratory status stable.  Doubt that patient has bacterial pneumonia.    No need for any antibiotics.  Acute diastolic CHF -Strict input output.  Daily weights. -Likely due to volume resuscitation for acute kidney injury -Echo showed EF of 60 to 65%. -Treated with Lasix intermittently.  Patient has been intermittently refusing oral Lasix.  Will not discharge on any Lasix because of noncompliance.  Will need outpatient cardiology evaluation.  Essential hypertension -Blood pressure stable.  Continue amlodipine and Inderal.  Lisinopril on hold.  Outpatient follow-up  Hyperlipidemia -Resume statins.  LFTs have normalized  Abnormal LFTs -Probably from septic shock.  AST and ALT peaked in the 700-800 range.  LFTs have normalized.  Right upper quadrant ultrasound  unremarkable.  Anxiety disorder/bipolar disorder -Cymbalta, Seroquel and trazodone have been ordered but patient has not taken them and refuses them -Patient is still intermittently refusing blood work, medications, insulin.   -Patient refused psychiatric evaluation on 10/02/2018.  Will need outpatient psychiatric evaluation. -I think patient has the capacity to make his own medical decisions and is just making bad decisions.  He intermittently would agree for some medication but would refuse some other medications.  He only likes to lay down in bed.  I have tried to call patient's sister to let her know that patient is being discharged but without success.  Dysphagia/odynophagia Moderate to severe reflux, candidiasis and erosive esophagitis -Patient was placed on Magic mouthwash, brief Diflucan(discontinued due to prolonged QT) without much improvement.    --Status post EGD on 09/30/2018 by [facility].  Pathology is negative for virus/yeast.    Is currently on IV Protonix.  We will switch to oral Protonix and oral antacid.  Outpatient follow-up with GI. -SLP and nutrition team following.    Appetite still poor but patient is not interested in feeding tube or any other artificial forms of nutrition at this time.  Outpatient follow-up  Lip/tongue lesions -Unclear etiology.  ID input appreciated.    Treated with topical acyclovir to lip lesions: Not typical for zoster.  Question of HSV.  White plaque on tongue: Question leukoplakia, if does not improve, then follow-up with oral surgery/dermatology for biopsy consideration  Normocytic anemia Stable  Thrombocytopenia -Transient.  Resolved  Leukocytosis -Resolved  Acute encephalopathy -Present on admission.  Resolved.  Monitor mental status.  Still intermittently agitated and refuses treatment.    Outpatient follow-up with psychiatry/PCP  Tobacco/polysubstance abuse history -UDS negative on admission  Generalized deconditioning -PT/OT  eval.  Will require SNF at discharge.  Patient refused SNF placement.  Patient has refused to work with PT in the hospital.  I am not sure if patient would agree for PT/OT arrangement at home.  We will let care management know.  Discharge Instructions  Discharge Instructions    Ambulatory referral to Cardiology   Complete by:  As directed    ?CHF/non compliant patient   Ambulatory referral to Psychiatry   Complete by:  As directed    Bipolar/noncompliant   Call MD for:  difficulty breathing, headache or visual disturbances   Complete by:  As directed    Call MD for:  extreme fatigue   Complete by:  As directed    Call MD for:  hives   Complete by:  As directed    Call MD for:  persistant dizziness or light-headedness   Complete by:  As directed    Call MD for:  persistant nausea and vomiting   Complete by:  As directed    Call MD for:  severe uncontrolled pain   Complete by:  As directed    Call MD for:  temperature >100.4   Complete by:  As directed    Diet - low sodium heart healthy   Complete by:  As directed    Diet Carb Modified   Complete by:  As directed    Increase activity slowly   Complete by:  As directed      Allergies as of 10/04/2018   No Known Allergies     Medication List    STOP taking these medications   lisinopril 5 MG tablet Commonly known as:  PRINIVIL,ZESTRIL   metFORMIN 500 MG 24 hr tablet Commonly known as:  GLUCOPHAGE-XR  TAKE these medications   alum & mag hydroxide-simeth 200-200-20 MG/5ML suspension Commonly known as:  MAALOX/MYLANTA Take 30 mLs by mouth every 6 (six) hours as needed for indigestion or heartburn.   amLODipine 5 MG tablet Commonly known as:  NORVASC Take 1 tablet (5 mg total) by mouth daily. Start taking on:  October 05, 2018   atorvastatin 40 MG tablet Commonly known as:  LIPITOR Take 40 mg by mouth daily.   Basaglar KwikPen 100 UNIT/ML Sopn Inject 0.45 mLs (45 Units total) into the skin at bedtime. What  changed:  how much to take   DULoxetine 60 MG capsule Commonly known as:  CYMBALTA Take 60 mg by mouth 2 (two) times daily.   esomeprazole 40 MG capsule Commonly known as:  NEXIUM Take 1 capsule (40 mg total) by mouth 2 (two) times daily before a meal. What changed:  when to take this   insulin lispro 100 UNIT/ML injection Commonly known as:  HUMALOG Inject 15 Units into the skin 3 (three) times daily before meals.   Melatonin 3 MG Tabs Take 3 mg by mouth at bedtime.   nystatin cream Commonly known as:  MYCOSTATIN Apply topically 2 (two) times daily.   ondansetron 4 MG disintegrating tablet Commonly known as:  ZOFRAN-ODT Take 1 tablet (4 mg total) by mouth every 6 (six) hours as needed for nausea or vomiting.   propranolol 10 MG tablet Commonly known as:  INDERAL Take 10 mg by mouth 3 (three) times daily.   QUEtiapine 400 MG tablet Commonly known as:  SEROQUEL Take 400 mg by mouth at bedtime.   traZODone 50 MG tablet Commonly known as:  DESYREL Take 50 mg by mouth at bedtime.      Follow-up Information    PCP. Schedule an appointment as soon as possible for a visit in 1 week(s).   Why:  With repeat CBC/BMP       Vida Rigger, MD. Schedule an appointment as soon as possible for a visit in 1 week(s).   Specialty:  Gastroenterology Contact information: [address] [place] [address] [phone]        Psychiatrist Follow up.   Why:  At earliest convenience         No Known Allergies  Consultations:  PCCM/ID/GI/nephrology   Procedures/Studies: Dg Chest 2 View  Result Date: 09/28/2018 CLINICAL DATA:  Hypoxia EXAM: CHEST - 2 VIEW COMPARISON:  09/24/2018 chest radiograph. FINDINGS: Stable cardiomediastinal silhouette with normal heart size. No pneumothorax. Small bilateral pleural effusions. Extensive patchy opacity throughout both lungs is mildly improved bilaterally. IMPRESSION: 1. Mildly improved extensive patchy opacity  throughout both lungs, suggesting improving multifocal pneumonia and/or pulmonary edema. 2. Small bilateral pleural effusions. Electronically Signed   By: Delbert Phenix M.D.   On: 09/28/2018 16:27   Dg Ribs Unilateral Right  Result Date: 09/29/2018 CLINICAL DATA:  Right rib pain. EXAM: RIGHT RIBS - 2 VIEW COMPARISON:  Radiographs of September 28, 2018. FINDINGS: No fracture or other bone lesions are seen involving the ribs. IMPRESSION: Negative. Electronically Signed   By: Lupita Raider, M.D.   On: 09/29/2018 17:36   Dg Abd 1 View  Result Date: 09/18/2018 CLINICAL DATA:  NG tube placement EXAM: ABDOMEN - 1 VIEW COMPARISON:  09/17/2018 FINDINGS: NG tube tip is in the proximal stomach. Gaseous distention of large bowel is similar prior study. IMPRESSION: NG tube tip in the proximal stomach. Electronically Signed   By: Charlett Nose M.D.  On: 09/18/2018 16:29   Ct Head Wo Contrast  Result Date: 09/18/2018 CLINICAL DATA:  Altered level of consciousness. EXAM: CT HEAD WITHOUT CONTRAST TECHNIQUE: Contiguous axial images were obtained from the base of the skull through the vertex without intravenous contrast. COMPARISON:  CT HEAD September 17, 2018 FINDINGS: BRAIN: No intraparenchymal hemorrhage, mass effect nor midline shift. Mild parenchymal brain volume loss for age. No acute large vascular territory infarcts. No abnormal extra-axial fluid collections. Basal cisterns are patent. VASCULAR: Unremarkable. SKULL/SOFT TISSUES: No skull fracture. No significant soft tissue swelling. ORBITS/SINUSES: The included ocular globes and orbital contents are normal.Mild paranasal sinus mucosal thickening. Mastoid air cells are well aerated. OTHER: Life support lines in place. IMPRESSION: 1. No acute intracranial process. 2. Mild parenchymal brain volume loss for age. Electronically Signed   By: Awilda Metro M.D.   On: 09/18/2018 01:36   Ct Chest Wo Contrast  Result Date: 09/30/2018 CLINICAL DATA:  Chronic dyspnea. EXAM:  CT CHEST WITHOUT CONTRAST TECHNIQUE: Multidetector CT imaging of the chest was performed following the standard protocol without IV contrast. COMPARISON:  02/18/2018 FINDINGS: Cardiovascular: Evaluation of vascular structures is limited without IV contrast material. Heart size is normal. No pericardial effusions. Normal caliber thoracic aorta. Mediastinum/Nodes: Scattered mediastinal lymph nodes are not pathologically enlarged, likely reactive. Esophagus is decompressed. Suggestion of wall thickening in the distal esophagus as seen on prior study. This could be due to reflux disease or a mass lesion, as suggested previously. No evidence of proximal obstruction. Lungs/Pleura: Patchy airspace and interstitial infiltrates demonstrated throughout both lungs, new since prior CT. Small bilateral pleural effusions. Changes likely to represent multifocal pneumonia. No pneumothorax. Airways are patent. Upper Abdomen: No acute abnormalities. Musculoskeletal: No chest wall mass or suspicious bone lesions identified. Old right rib fractures. IMPRESSION: 1. Patchy airspace and interstitial infiltrates throughout both lungs, new since prior study. Small bilateral pleural effusions. Changes likely to represent multifocal pneumonia. 2. Suggestion of wall thickening in the distal esophagus. This could be due to reflux disease or mass lesion. Suggest correlation with endoscopy, as was also suggested in the previous study. Electronically Signed   By: Burman Nieves M.D.   On: 09/30/2018 22:05   Dg Chest Port 1 View  Result Date: 09/30/2018 CLINICAL DATA:  58 year old male with recent hypoxia and multifocal pneumonia versus edema. EXAM: PORTABLE CHEST 1 VIEW COMPARISON:  Chest radiographs 09/28/2018 and earlier. FINDINGS: Portable AP semi upright view at 1541 hours. Patchy and streaky bilateral pulmonary opacity is stable since 09/28/2018 and more extensive in the left lung. No pneumothorax. No pleural effusion is evident. No  areas of worsening ventilation. Stable cardiac size and mediastinal contours. Visualized tracheal air column is within normal limits. Chronic left clavicle fracture. No acute osseous abnormality identified. Negative visible bowel gas pattern IMPRESSION: Stable since 09/28/2018. Patchy and streaky bilateral pulmonary opacity. No new cardiopulmonary abnormality. Electronically Signed   By: Odessa Fleming M.D.   On: 09/30/2018 15:59   Dg Chest Port 1 View  Result Date: 09/24/2018 CLINICAL DATA:  Community-acquired pneumonia EXAM: PORTABLE CHEST 1 VIEW COMPARISON:  09/23/2018 FINDINGS: Cardiac shadow is stable. Left jugular central line is again noted in satisfactory position. The endotracheal tube and nasogastric catheter have been removed in the interval. Increasing patchy infiltrates are noted bilaterally as well as central vascular congestion and edema. IMPRESSION: Increasing patchy infiltrates bilaterally. Central vascular congestion and interstitial edema. Electronically Signed   By: Alcide Clever M.D.   On: 09/24/2018 07:16   Dg Chest [facility]  1 View  Result Date: 09/23/2018 CLINICAL DATA:  58 year old male with hepatitis, hyperglycemia. Respiratory failure. EXAM: PORTABLE CHEST 1 VIEW COMPARISON:  09/22/2018 and earlier. FINDINGS: Portable AP semi upright view at 0543 hours. Endotracheal tube tip is stable at the level the clavicles. Enteric tube is stable with side hole at the level of the gastric cardia. Stable left IJ central line. Stable lung volumes and mediastinal contours. Coarse and confluent scattered bilateral pulmonary opacity persists. This is most pronounced in both lower lungs, and some of the left mediastinal and diaphragm contour are obscured as before. Right upper lobe ventilation may be mildly improved since yesterday. No superimposed pneumothorax or large pleural effusion. Paucity bowel gas in the upper abdomen. IMPRESSION: 1. Stable lines and tubes. 2. Continued coarse and confluent bilateral  pulmonary opacity. Mild if any improvement in right upper lung ventilation since yesterday. Electronically Signed   By: Odessa Fleming M.D.   On: 09/23/2018 06:56   Dg Chest Port 1 View  Result Date: 09/22/2018 CLINICAL DATA:  Respiratory failure with hypoxia EXAM: PORTABLE CHEST 1 VIEW COMPARISON:  09/19/2018, 09/17/2018, 09/05/2018 FINDINGS: Endotracheal tube tip is about 1.5 cm superior to the carina. Esophageal tube tip overlies the proximal stomach. Left-sided central venous catheter tip over the mid right atrium. Worsening consolidation at the left base. Worsening bilateral ground-glass opacity. Probable small left effusion. Stable cardiomediastinal silhouette. Old left clavicle fracture IMPRESSION: 1. Endotracheal tube tip about 1.5 cm superior to carina. Left IJ central venous catheter tip projects over the mid right atrium 2. Interval worsening of bilateral ground-glass opacity with worsened consolidation at the left lung base. Electronically Signed   By: Jasmine Pang M.D.   On: 09/22/2018 03:58   Dg Chest Port 1 View  Result Date: 09/19/2018 CLINICAL DATA:  Respiratory insufficiency. Endotracheal tube present. EXAM: PORTABLE CHEST 1 VIEW COMPARISON:  09/17/2018 FINDINGS: Endotracheal tube is 3.3 cm above the carina. Left jugular central line tip at the superior cavoatrial junction. Nasogastric tube extends into the abdomen. Patchy interstitial lung densities are again noted and minimally changed. Slightly improved aeration at the right costophrenic angle region. Heart size is upper limits of normal but stable. Negative for a pneumothorax. Evidence for old left clavicle fracture. IMPRESSION: Patchy interstitial lung densities are stable. Findings could represent edema and/or infection. Support apparatuses as described. Electronically Signed   By: Richarda Overlie M.D.   On: 09/19/2018 12:08   Dg Chest Port 1 View  Result Date: 09/17/2018 CLINICAL DATA:  Endotracheal tube placement EXAM: PORTABLE CHEST 1 VIEW  COMPARISON:  Chest x-ray from earlier same day. FINDINGS: Endotracheal tube is well positioned with tip approximately 2 cm above the carina. Enteric tube passes below the diaphragm. There is a new LEFT IJ central line with tip well-positioned at the expected level of the cavoatrial junction. No pneumothorax seen. Borderline cardiomegaly, stable. Hazy perihilar and bibasilar opacities are unchanged in the short-term interval, presumably mild pulmonary edema. Probable small RIGHT pleural effusion and/or atelectasis. IMPRESSION: 1. New LEFT IJ central line with tip well-positioned at the expected level of the cavoatrial junction. No pneumothorax seen. 2. Endotracheal tube well positioned with tip approximately 2 cm above the carina. 3. Persistent perihilar and bibasilar opacities, presumably mild pulmonary edema. Probable small RIGHT pleural effusion and/or atelectasis. Electronically Signed   By: Bary Richard M.D.   On: 09/17/2018 16:50   Dg Abd Portable 1v  Result Date: 09/17/2018 CLINICAL DATA:  Concerns for foreign object. EXAM: PORTABLE ABDOMEN - 1 VIEW  COMPARISON:  09/17/2018 FINDINGS: As on the prior exam, there is a needle like structure, possibly within a sheath, projecting over the right upper quadrant. No appreciable extraluminal gas. This could be further localized with CT abdomen if clinically warranted. IMPRESSION: 1. Stable size and appearance of the right upper quadrant needle like structure. This may have some sort of sheath around it. Electronically Signed   By: Gaylyn RongWalter  Liebkemann M.D.   On: 09/17/2018 18:40   Dg Abd Portable 1v  Result Date: 09/17/2018 CLINICAL DATA:  Possible foreign body; r/o metallic fragments after CVL catheter was pulled out after guidewire left in at outside hospital. EXAM: PORTABLE ABDOMEN - 1 VIEW COMPARISON:  Plain film of the abdomen dated 09/08/2018. FINDINGS: New linear foreign body at the level of the mid RIGHT abdomen, lateral aspect, measuring approximately  4.5 cm in length, presumably the suspected foreign body related to previous catheter. Visualized bowel gas pattern is nonobstructive. No additional foreign bodies identified within the abdomen or about the RIGHT hip. IMPRESSION: New linear foreign body at the level of the mid RIGHT abdomen, measuring 4.5 cm in length, presumably the suspected foreign body related to the provided clinical data of catheter/guidewire placement at an outside hospital. The exact location of the foreign body is unclear based on these AP views. Consider decubitus view of the abdomen to evaluate depth. These results will be called to the ordering clinician or representative by the Radiologist Assistant, and communication documented in the PACS or zVision Dashboard. Electronically Signed   By: Bary RichardStan  Maynard M.D.   On: 09/17/2018 16:55   Vas [place] Lower Extremity Arterial Duplex  Result Date: 09/17/2018 LOWER EXTREMITY ARTERIAL DUPLEX STUDY Indications: Right CFA stick with guide line left in artery for 5 hours by              another facility.  Current ABI: N/A Performing Technologist: Jeb LeveringJill Parker RDMS, RVT  Examination Guidelines: A complete evaluation includes B-mode imaging, spectral Doppler, color Doppler, and power Doppler as needed of all accessible portions of each vessel. Bilateral testing is considered an integral part of a complete examination. Limited examinations for reoccurring indications may be performed as noted.  Right Duplex Findings:  Patent CFA and CFV.  Summary: Right: No evidence of pseudoaneurysm, AV fistula, or hematoma.  See table(s) above for measurements and observations. Electronically signed by Gretta Beganodd Early MD on 09/17/2018 at 6:22:16 PM.    Final    [place] Abdomen Limited Ruq  Result Date: 09/23/2018 CLINICAL DATA:  58 year old male with hepatitis, hyperglycemia. EXAM: ULTRASOUND ABDOMEN LIMITED RIGHT UPPER QUADRANT [facility]. FINDINGS: Gallbladder: No  gallstones or wall thickening visualized. No sonographic Murphy sign noted by sonographer. Common bile duct: Diameter: 6 millimeters, upper limits of normal. Liver: Liver echogenicity appears within normal limits (image 20). No discrete liver lesion. No intrahepatic biliary ductal dilatation. Portal vein is patent on color Doppler imaging with normal direction of blood flow towards the liver. Other findings: Negative visible right kidney. Small right pleural effusion is visible on image 37. No ascites identified. IMPRESSION: 1. Ultrasound appearance of the liver and right upper quadrant within normal limits. 2. Small right pleural effusion. Electronically Signed   By: Odessa FlemingH  Hall M.D.   On: 09/23/2018 06:32    EGD on 09/30/2018 Impression - Normal larynx. - Moderately severe reflux, candidiasis and erosive esophagitis. Biopsied. Cells for cytology obtained. - Small hiatal hernia. - Chronic gastritis. - Chronic duodenitis. - Normal second  portion of the duodenum and third portion of the duodenum. - The examination was otherwise normal.  Recommendations - Clear liquid diet today. Slowly advance as tolerated trial of viscous lidocaine prior to eating - Continue present medications. - Await pathology results. - Return to GI clinic PRN. - Telephone GI clinic for pathology results in 1 week. Please call if we could be of any further assistance with this hospital stay - Telephone GI clinic if symptomatic PRN.   Subjective: Patient seen and examined at bedside.  He is awake, hardly engages in any conversation.  I told him that I will discharge him and he stated okay.  He still refuses medication intermittently.  No overnight fever or vomiting.  Discharge Exam: Vitals:   10/03/18 2118 10/04/18 0532  BP: 132/70 139/74  Pulse: 87 (!) 108  Resp: 19   Temp: 97.7 F (36.5 C) 98.6 F (37 C)  SpO2: 100% 95%   Vitals:   10/03/18 0541 10/03/18 1306 10/03/18 2118 10/04/18 0532  BP: 125/78 (!)  145/79 132/70 139/74  Pulse: (!) 115 (!) 106 87 (!) 108  Resp:  19 19   Temp: 98 F (36.7 C) 97.6 F (36.4 C) 97.7 F (36.5 C) 98.6 F (37 C)  TempSrc: Oral Oral Oral Oral  SpO2: 94% 99% 100% 95%  Weight:    69.2 kg  Height:        General exam:  Appears older than stated age.  Looks very deconditioned.  Awake but poor historian.  No distress.   Respiratory system: Bilateral decreased breath sounds at bases, scattered crackles.  Cardiovascular system: Rate controlled, S1-S2 heard Gastrointestinal system: Abdomen is nondistended, soft and nontender. Normal bowel sounds heard. Extremities: No cyanosis; trace edema   The results of significant diagnostics from this hospitalization (including imaging, microbiology, ancillary and laboratory) are listed below for reference.     Microbiology: No results found for this or any previous visit (from the past 240 hour(s)).   Labs: BNP (last 3 results) No results for input(s): BNP in the last 8760 hours. Basic Metabolic Panel: Recent Labs  Lab 09/29/18 0809 09/30/18 0645 10/01/18 0316 10/02/18 0322 10/03/18 0407 10/04/18 0743  NA 139 136 136 137 135 135  K 3.8 3.2* 2.9* 2.8* 2.7* 3.3*  CL 99 96* 96* 100 93* 98  CO2 GLUCOSE 264* 279* 247* 154* 339* 284*  BUN CREATININE 1.22 1.02 0.91 0.76 1.01 0.77  CALCIUM 7.1* 7.5* 7.6* 7.9* 7.8* 7.8*  MG 1.6* 1.8 1.5* 1.7 1.2*  --    Liver Function Tests: Recent Labs  Lab 09/28/18 0546 09/29/18 0504 09/30/18 0645 10/01/18 0316 10/02/18 0322  AST 23 73* ALT 103* QUANTITY NOT SUFFICIENT, UNABLE TO PERFORM TEST 58* 43 34  ALKPHOS 132* 126 128* 115 102  BILITOT 1.2 QUANTITY NOT SUFFICIENT, UNABLE TO PERFORM TEST 0.8 0.6 0.5  PROT 5.5* 5.8* 6.0* 6.1* 6.1*  ALBUMIN 1.8* 1.8* 2.0* 2.0* 2.0*   No results for input(s): LIPASE, AMYLASE in the last 168 hours. No results for input(s): AMMONIA in the last 168 hours. CBC: Recent Labs  Lab  10/01/18 0316 10/02/18 0322  WBC 8.7 8.7  NEUTROABS  --  5.6  HGB 11.2* 10.9*  HCT 33.8* 32.3*  MCV 87.1 86.6  PLT 360 334   Cardiac Enzymes: No results for input(s): CKTOTAL, CKMB, CKMBINDEX, TROPONINI in the last 168 hours. BNP: Invalid input(s): POCBNP  CBG: Recent Labs  Lab 10/03/18 1148 10/03/18 1715 10/03/18 2120 10/04/18 0806 10/04/18 1154  GLUCAP 328* 83 146* 317* 151*   D-Dimer No results for input(s): DDIMER in the last 72 hours. Hgb A1c No results for input(s): HGBA1C in the last 72 hours. Lipid Profile No results for input(s): CHOL, HDL, LDLCALC, TRIG, CHOLHDL, LDLDIRECT in the last 72 hours. Thyroid function studies No results for input(s): TSH, T4TOTAL, T3FREE, THYROIDAB in the last 72 hours.  Invalid input(s): FREET3 Anemia work up No results for input(s): VITAMINB12, FOLATE, FERRITIN, TIBC, IRON, RETICCTPCT in the last 72 hours. Urinalysis    Component Value Date/Time   COLORURINE YELLOW 09/19/2018 1217   APPEARANCEUR HAZY (A) 09/19/2018 1217   LABSPEC 1.012 09/19/2018 1217   PHURINE 5.0 09/19/2018 1217   GLUCOSEU 50 (A) 09/19/2018 1217   HGBUR LARGE (A) 09/19/2018 1217   BILIRUBINUR NEGATIVE 09/19/2018 1217   KETONESUR NEGATIVE 09/19/2018 1217   PROTEINUR 30 (A) 09/19/2018 1217   NITRITE NEGATIVE 09/19/2018 1217   LEUKOCYTESUR NEGATIVE 09/19/2018 1217   Sepsis Labs Invalid input(s): PROCALCITONIN,  WBC,  LACTICIDVEN Microbiology No results found for this or any previous visit (from the past 240 hour(s)).   Time coordinating discharge: 35 minutes  SIGNED:   Glade Lloyd, MD  [facility] 10/04/2018, 12:10 PM

## 2018-10-07 LAB — GLUCOSE, CAPILLARY: Glucose-Capillary: 577 mg/dL (ref 70–99)

## 2018-10-17 ENCOUNTER — Other Ambulatory Visit: Payer: Self-pay

## 2018-10-17 ENCOUNTER — Encounter (HOSPITAL_COMMUNITY): Payer: Self-pay

## 2018-10-17 ENCOUNTER — Inpatient Hospital Stay (HOSPITAL_COMMUNITY)
Admission: EM | Admit: 2018-10-17 | Discharge: 2018-10-21 | DRG: 638 | Disposition: A | Discharge status: Skilled Nursing Facility | Payer: Medicaid Other | Attending: Family Medicine | Admitting: Family Medicine

## 2018-10-17 DIAGNOSIS — Z79890 Hormone replacement therapy (postmenopausal): Secondary | ICD-10-CM

## 2018-10-17 DIAGNOSIS — I10 Essential (primary) hypertension: Secondary | ICD-10-CM | POA: Diagnosis present

## 2018-10-17 DIAGNOSIS — Z9181 History of falling: Secondary | ICD-10-CM

## 2018-10-17 DIAGNOSIS — E1110 Type 2 diabetes mellitus with ketoacidosis without coma: Secondary | ICD-10-CM

## 2018-10-17 DIAGNOSIS — Z66 Do not resuscitate: Secondary | ICD-10-CM | POA: Diagnosis present

## 2018-10-17 DIAGNOSIS — Z87891 Personal history of nicotine dependence: Secondary | ICD-10-CM

## 2018-10-17 DIAGNOSIS — F319 Bipolar disorder, unspecified: Secondary | ICD-10-CM | POA: Diagnosis present

## 2018-10-17 DIAGNOSIS — E7800 Pure hypercholesterolemia, unspecified: Secondary | ICD-10-CM | POA: Diagnosis present

## 2018-10-17 DIAGNOSIS — E785 Hyperlipidemia, unspecified: Secondary | ICD-10-CM | POA: Diagnosis present

## 2018-10-17 DIAGNOSIS — E1042 Type 1 diabetes mellitus with diabetic polyneuropathy: Secondary | ICD-10-CM | POA: Diagnosis present

## 2018-10-17 DIAGNOSIS — Z794 Long term (current) use of insulin: Secondary | ICD-10-CM | POA: Diagnosis not present

## 2018-10-17 DIAGNOSIS — I493 Ventricular premature depolarization: Secondary | ICD-10-CM | POA: Diagnosis present

## 2018-10-17 DIAGNOSIS — E860 Dehydration: Secondary | ICD-10-CM | POA: Diagnosis present

## 2018-10-17 DIAGNOSIS — E1010 Type 1 diabetes mellitus with ketoacidosis without coma: Principal | ICD-10-CM | POA: Diagnosis present

## 2018-10-17 DIAGNOSIS — Z8249 Family history of ischemic heart disease and other diseases of the circulatory system: Secondary | ICD-10-CM

## 2018-10-17 DIAGNOSIS — E875 Hyperkalemia: Secondary | ICD-10-CM | POA: Diagnosis present

## 2018-10-17 DIAGNOSIS — R739 Hyperglycemia, unspecified: Secondary | ICD-10-CM | POA: Diagnosis present

## 2018-10-17 DIAGNOSIS — Z833 Family history of diabetes mellitus: Secondary | ICD-10-CM | POA: Diagnosis not present

## 2018-10-17 DIAGNOSIS — B3781 Candidal esophagitis: Secondary | ICD-10-CM | POA: Diagnosis present

## 2018-10-17 DIAGNOSIS — Z79899 Other long term (current) drug therapy: Secondary | ICD-10-CM

## 2018-10-17 LAB — BASIC METABOLIC PANEL
Anion gap: >20 — ABNORMAL HIGH (ref 5–15)
Anion gap: 18 — ABNORMAL HIGH (ref 5–15)
Anion gap: 14 (ref 5–15)
BUN: 26 mg/dL — ABNORMAL HIGH (ref 6–20)
BUN: 23 mg/dL — ABNORMAL HIGH (ref 6–20)
BUN: 19 mg/dL (ref 6–20)
CO2: 11 mmol/L — ABNORMAL LOW (ref 22–32)
CO2: 14 mmol/L — ABNORMAL LOW (ref 22–32)
CO2: 19 mmol/L — ABNORMAL LOW (ref 22–32)
Calcium: 9.3 mg/dL (ref 8.9–10.3)
Calcium: 8.5 mg/dL — ABNORMAL LOW (ref 8.9–10.3)
Calcium: 8.6 mg/dL — ABNORMAL LOW (ref 8.9–10.3)
Chloride: 90 mmol/L — ABNORMAL LOW (ref 98–111)
Chloride: 95 mmol/L — ABNORMAL LOW (ref 98–111)
Chloride: 96 mmol/L — ABNORMAL LOW (ref 98–111)
Creatinine, Ser: 1.26 mg/dL — ABNORMAL HIGH (ref 0.61–1.24)
Creatinine, Ser: 1.21 mg/dL (ref 0.61–1.24)
Creatinine, Ser: 0.88 mg/dL (ref 0.61–1.24)
GFR calc Af Amer: >60 mL/min (ref >60)
GFR calc non Af Amer: >60 mL/min (ref >60)
GFR calc non Af Amer: >60 mL/min (ref >60)
GFR calc non Af Amer: >60 mL/min (ref >60)
Glucose, Bld: 515 mg/dL (ref 70–99)
Glucose, Bld: 286 mg/dL — ABNORMAL HIGH (ref 70–99)
Glucose, Bld: 155 mg/dL — ABNORMAL HIGH (ref 70–99)
Potassium: 6.1 mmol/L — ABNORMAL HIGH (ref 3.5–5.1)
Potassium: 4.3 mmol/L (ref 3.5–5.1)
Potassium: 4.4 mmol/L (ref 3.5–5.1)
SODIUM: 129 mmol/L — AB (ref 135–145)
Sodium: 127 mmol/L — ABNORMAL LOW (ref 135–145)
Sodium: 127 mmol/L — ABNORMAL LOW (ref 135–145)

## 2018-10-17 LAB — CBC WITH DIFFERENTIAL/PLATELET
ABS IMMATURE GRANULOCYTES: 0.08 10*3/uL — AB (ref 0.00–0.07)
Basophils Absolute: 0.1 10*3/uL (ref 0.0–0.1)
Basophils Relative: 1 %
Eosinophils Absolute: 0.1 10*3/uL (ref 0.0–0.5)
Eosinophils Relative: 1 %
HCT: 39.1 % (ref 39.0–52.0)
Hemoglobin: 12.6 g/dL — ABNORMAL LOW (ref 13.0–17.0)
Immature Granulocytes: 1 %
Lymphocytes Relative: 20 %
Lymphs Abs: 2 10*3/uL (ref 0.7–4.0)
MCH: 29.4 pg (ref 26.0–34.0)
MCHC: 32.2 g/dL (ref 30.0–36.0)
MCV: 91.1 fL (ref 80.0–100.0)
Monocytes Absolute: 0.6 10*3/uL (ref 0.1–1.0)
Monocytes Relative: 6 %
NEUTROS ABS: 7.1 10*3/uL (ref 1.7–7.7)
Neutrophils Relative %: 71 %
Platelets: 476 10*3/uL — ABNORMAL HIGH (ref 150–400)
RBC: 4.29 MIL/uL (ref 4.22–5.81)
RDW: 13.4 % (ref 11.5–15.5)
WBC: 9.9 10*3/uL (ref 4.0–10.5)
nRBC: 0 % (ref 0.0–0.2)

## 2018-10-17 LAB — URINALYSIS, ROUTINE W REFLEX MICROSCOPIC
Bacteria, UA: NONE SEEN
Bilirubin Urine: NEGATIVE
Glucose, UA: >=500 mg/dL — AB
Ketones, ur: 80 mg/dL — AB
Nitrite: NEGATIVE
Protein, ur: NEGATIVE mg/dL
Specific Gravity, Urine: 1.017 (ref 1.005–1.030)
WBC, UA: >50 WBC/hpf — ABNORMAL HIGH (ref 0–5)
pH: 5 (ref 5.0–8.0)

## 2018-10-17 LAB — BLOOD GAS, VENOUS
ACID-BASE DEFICIT: 15 mmol/L — AB (ref 0.0–2.0)
Bicarbonate: 11.9 mmol/L — ABNORMAL LOW (ref 20.0–28.0)
FIO2: 21
O2 Saturation: 25.8 %
Patient temperature: 36.8
pCO2, Ven: 30.9 mmHg — ABNORMAL LOW (ref 44.0–60.0)
pH, Ven: 7.196 — CL (ref 7.250–7.430)
pO2, Ven: <31 mmHg — CL (ref 32.0–45.0)

## 2018-10-17 LAB — GLUCOSE, CAPILLARY
Glucose-Capillary: 134 mg/dL — ABNORMAL HIGH (ref 70–99)
Glucose-Capillary: 148 mg/dL — ABNORMAL HIGH (ref 70–99)
Glucose-Capillary: 151 mg/dL — ABNORMAL HIGH (ref 70–99)
Glucose-Capillary: 185 mg/dL — ABNORMAL HIGH (ref 70–99)

## 2018-10-17 LAB — CBG MONITORING, ED
GLUCOSE-CAPILLARY: 481 mg/dL — AB (ref 70–99)
Glucose-Capillary: 464 mg/dL — ABNORMAL HIGH (ref 70–99)
Glucose-Capillary: 437 mg/dL — ABNORMAL HIGH (ref 70–99)
Glucose-Capillary: 285 mg/dL — ABNORMAL HIGH (ref 70–99)

## 2018-10-17 MED ORDER — MELATONIN 3 MG PO TABS
3.0000 mg | ORAL_TABLET | Freq: Every day | ORAL | Status: DC
  Administered 2018-10-17 – 2018-10-20 (×4): 3 mg via ORAL
  Filled 2018-10-17 (×4): qty 1

## 2018-10-17 MED ORDER — LACTATED RINGERS IV BOLUS
2000.0000 mL | Freq: Once | INTRAVENOUS | Status: AC
  Administered 2018-10-17: 2000 mL via INTRAVENOUS

## 2018-10-17 MED ORDER — INSULIN REGULAR(HUMAN) IN NACL 100-0.9 UT/100ML-% IV SOLN
INTRAVENOUS | Status: DC
  Administered 2018-10-17: 4 [IU]/h via INTRAVENOUS
  Filled 2018-10-17: qty 100

## 2018-10-17 MED ORDER — ONDANSETRON HCL 4 MG/2ML IJ SOLN
4.0000 mg | Freq: Once | INTRAMUSCULAR | Status: AC
  Administered 2018-10-17: 4 mg via INTRAVENOUS
  Filled 2018-10-17: qty 2

## 2018-10-17 MED ORDER — AMLODIPINE BESYLATE 5 MG PO TABS
5.0000 mg | ORAL_TABLET | Freq: Every day | ORAL | Status: DC
  Administered 2018-10-17 – 2018-10-21 (×5): 5 mg via ORAL
  Filled 2018-10-17 (×5): qty 1

## 2018-10-17 MED ORDER — DEXTROSE-NACL 5-0.45 % IV SOLN: INTRAVENOUS | Status: DC

## 2018-10-17 MED ORDER — FLUCONAZOLE IN SODIUM CHLORIDE 200-0.9 MG/100ML-% IV SOLN
200.0000 mg | INTRAVENOUS | Status: DC
  Administered 2018-10-18 – 2018-10-19 (×2): 200 mg via INTRAVENOUS
  Filled 2018-10-17 (×4): qty 100

## 2018-10-17 MED ORDER — QUETIAPINE FUMARATE 100 MG PO TABS
400.0000 mg | ORAL_TABLET | Freq: Every day | ORAL | Status: DC
  Administered 2018-10-17 – 2018-10-20 (×4): 400 mg via ORAL
  Filled 2018-10-17 (×4): qty 4

## 2018-10-17 MED ORDER — DEXTROSE-NACL 5-0.45 % IV SOLN
INTRAVENOUS | Status: DC
  Administered 2018-10-17: 21:00:00 via INTRAVENOUS

## 2018-10-17 MED ORDER — SODIUM CHLORIDE 0.9 % IV SOLN: INTRAVENOUS | Status: DC

## 2018-10-17 MED ORDER — ALUM & MAG HYDROXIDE-SIMETH 200-200-20 MG/5ML PO SUSP
30.0000 mL | Freq: Four times a day (QID) | ORAL | Status: DC | PRN
  Administered 2018-10-18 – 2018-10-19 (×2): 30 mL via ORAL
  Filled 2018-10-17 (×2): qty 30

## 2018-10-17 MED ORDER — SODIUM CHLORIDE 0.9 % IV SOLN
1.0000 g | Freq: Once | INTRAVENOUS | Status: AC
  Administered 2018-10-17: 1 g via INTRAVENOUS
  Filled 2018-10-17: qty 10

## 2018-10-17 MED ORDER — LORAZEPAM 1 MG PO TABS
1.0000 mg | ORAL_TABLET | Freq: Once | ORAL | Status: AC
  Administered 2018-10-17: 1 mg via ORAL
  Filled 2018-10-17: qty 1

## 2018-10-17 MED ORDER — LORAZEPAM 2 MG/ML IJ SOLN
2.0000 mg | Freq: Once | INTRAMUSCULAR | Status: AC
  Administered 2018-10-17: 2 mg via INTRAVENOUS
  Filled 2018-10-17: qty 1

## 2018-10-17 MED ORDER — ACETAMINOPHEN 325 MG PO TABS
650.0000 mg | ORAL_TABLET | Freq: Once | ORAL | Status: AC
  Administered 2018-10-17: 650 mg via ORAL
  Filled 2018-10-17: qty 2

## 2018-10-17 MED ORDER — ONDANSETRON 4 MG PO TBDP: 4.0000 mg | ORAL_TABLET | Freq: Four times a day (QID) | ORAL | Status: DC | PRN

## 2018-10-17 MED ORDER — DULOXETINE HCL 60 MG PO CPEP
60.0000 mg | ORAL_CAPSULE | Freq: Two times a day (BID) | ORAL | Status: DC
  Administered 2018-10-17 – 2018-10-21 (×8): 60 mg via ORAL
  Filled 2018-10-17 (×8): qty 1

## 2018-10-17 MED ORDER — TRAZODONE HCL 50 MG PO TABS
50.0000 mg | ORAL_TABLET | Freq: Every day | ORAL | Status: DC
  Administered 2018-10-17 – 2018-10-20 (×4): 50 mg via ORAL
  Filled 2018-10-17 (×4): qty 1

## 2018-10-17 MED ORDER — ATORVASTATIN CALCIUM 20 MG PO TABS
20.0000 mg | ORAL_TABLET | Freq: Every day | ORAL | Status: DC
  Administered 2018-10-17 – 2018-10-20 (×4): 20 mg via ORAL
  Filled 2018-10-17 (×4): qty 1

## 2018-10-17 MED ORDER — PROPRANOLOL HCL 20 MG PO TABS
10.0000 mg | ORAL_TABLET | Freq: Three times a day (TID) | ORAL | Status: DC
  Administered 2018-10-17 – 2018-10-21 (×10): 10 mg via ORAL
  Filled 2018-10-17 (×11): qty 1

## 2018-10-17 MED ORDER — PANTOPRAZOLE SODIUM 40 MG PO TBEC
40.0000 mg | DELAYED_RELEASE_TABLET | Freq: Every day | ORAL | Status: DC
  Administered 2018-10-18 – 2018-10-21 (×4): 40 mg via ORAL
  Filled 2018-10-17 (×4): qty 1

## 2018-10-17 MED ORDER — INSULIN REGULAR(HUMAN) IN NACL 100-0.9 UT/100ML-% IV SOLN
INTRAVENOUS | Status: DC
  Administered 2018-10-17: 1.8 [IU]/h via INTRAVENOUS

## 2018-10-17 MED ORDER — ENOXAPARIN SODIUM 40 MG/0.4ML ~~LOC~~ SOLN
40.0000 mg | SUBCUTANEOUS | Status: DC
  Administered 2018-10-18 – 2018-10-20 (×3): 40 mg via SUBCUTANEOUS
  Filled 2018-10-17 (×5): qty 0.4

## 2018-10-17 NOTE — Progress Notes (Signed)
Patient is belligerent and verbally abusive with any degree of care. Patient IV is in [facility] and has been fixed due to excessive occlusion at which time patient  verbally demeaning to staff.

## 2018-10-17 NOTE — ED Provider Notes (Signed)
[facility] EMERGENCY DEPARTMENT Provider Note   CSN: [id number] Arrival date & time: 10/17/18  1328    History   Chief Complaint Chief Complaint  Patient presents with  . Hyperglycemia    HPI Omar Wood is a 58 y.o. male.     HPI  58 year old male presents with hyperglycemia.  The patient presents via EMS.  When asked why the patient called 911, he states he feels like he is about to have a seizure.  When asked what this sensation is, he states he cannot explain it and you "have to be me" to understand.  He does endorse dizziness but this is been going on for over a month or so.  He also endorses bilateral lower extremity pain that has been ongoing for 2+ months.  His glucose has been reading high.  He had no appetite and endorses on and off compliance with his insulin.  He denies fever, cough, shortness of breath, chest pain.  He is been vomiting since yesterday.  Recently was discharged from the hospital for DKA. He states he's feeling anxious and asks for something to calm his nerves.  Past Medical History:  Diagnosis Date  . Bipolar disorder (HCC)   . Diabetes mellitus without complication (HCC)   . Hypercholesteremia   . Hypertension   . Marijuana user   . Scrotal injury   . Tobacco abuse     Patient Active Problem List   Diagnosis Date Noted  . DKA (diabetic ketoacidoses) (HCC) 09/17/2018  . Shock [facility])     Past Surgical History:  Procedure Laterality Date  . APPENDECTOMY    . BIOPSY  09/30/2018   Procedure: BIOPSY;  Surgeon: Vida Rigger, MD;  Location: [facility] ENDOSCOPY;  Service: Endoscopy;;  . ESOPHAGEAL BRUSHING  09/30/2018   Procedure: ESOPHAGEAL BRUSHING;  Surgeon: Vida Rigger, MD;  Location: [facility] ENDOSCOPY;  Service: Endoscopy;;  . ESOPHAGOGASTRODUODENOSCOPY (EGD) WITH PROPOFOL N/A 09/30/2018   Procedure: ESOPHAGOGASTRODUODENOSCOPY (EGD) WITH PROPOFOL;  Surgeon: Vida Rigger, MD;  Location: [facility] ENDOSCOPY;  Service: Endoscopy;  Laterality: N/A;        Home  Medications    Prior to Admission medications   Medication Sig Start Date End Date Taking? Authorizing Provider  alum & mag hydroxide-simeth (MAALOX/MYLANTA) 200-200-20 MG/5ML suspension Take 30 mLs by mouth every 6 (six) hours as needed for indigestion or heartburn. 10/04/18  Yes Glade Lloyd, MD  amLODipine (NORVASC) 5 MG tablet Take 1 tablet (5 mg total) by mouth daily. 10/05/18  Yes Glade Lloyd, MD  atorvastatin (LIPITOR) 20 MG tablet Take 20 mg by mouth daily.   Yes [provider]  DULoxetine (CYMBALTA) 60 MG capsule Take 60 mg by mouth 2 (two) times daily. 07/30/18  Yes [provider]  esomeprazole (NEXIUM) 40 MG capsule Take 1 capsule (40 mg total) by mouth 2 (two) times daily before a meal. 10/04/18  Yes Hanley Ben, Kshitiz, MD  Insulin Glargine (BASAGLAR KWIKPEN) 100 UNIT/ML SOPN Inject 0.45 mLs (45 Units total) into the skin at bedtime. Patient taking differently: Inject 40 Units into the skin at bedtime.  10/04/18  Yes Glade Lloyd, MD  insulin lispro (HUMALOG) 100 UNIT/ML injection Inject 5 Units into the skin 3 (three) times daily before meals.    Yes [provider]  Melatonin 3 MG TABS Take 3 mg by mouth at bedtime.   Yes [provider]  nystatin cream (MYCOSTATIN) Apply topically 2 (two) times daily. 10/04/18  Yes Glade Lloyd, MD  ondansetron (ZOFRAN-ODT) 4 MG  disintegrating tablet Take 1 tablet (4 mg total) by mouth every 6 (six) hours as needed for nausea or vomiting. 10/04/18  Yes Glade Lloyd, MD  propranolol (INDERAL) 10 MG tablet Take 10 mg by mouth 3 (three) times daily.   Yes [provider]  QUEtiapine (SEROQUEL) 400 MG tablet Take 400 mg by mouth at bedtime. 07/30/18  Yes [provider]  traZODone (DESYREL) 50 MG tablet Take 50 mg by mouth at bedtime. 07/30/18  Yes [provider]    Family History No family history on file.  Social History Social History   Tobacco Use  . Smoking status: Former Smoker     Types: Cigarettes  . Smokeless tobacco: Never Used  Substance Use Topics  . Alcohol use: No    Frequency: Never  . Drug use: No     Allergies   Patient has no known allergies.   Review of Systems Review of Systems  Constitutional: Negative for fever.  Respiratory: Negative for cough and shortness of breath.   Cardiovascular: Negative for chest pain.  Gastrointestinal: Positive for vomiting. Negative for abdominal pain.  Musculoskeletal: Positive for myalgias.  Neurological: Positive for dizziness. Negative for headaches.  All other systems reviewed and are negative.    Physical Exam Updated Vital Signs BP (!) 164/94   Pulse (!) 128   Temp 98.3 F (36.8 C) (Oral)   Resp (!) 21   SpO2 97%   Physical Exam Vitals signs and nursing note reviewed.  Constitutional:      Appearance: He is well-developed. He is not diaphoretic.  HENT:     Head: Normocephalic and atraumatic.     Right Ear: External ear normal.     Left Ear: External ear normal.     Nose: Nose normal.     Mouth/Throat:     Mouth: Mucous membranes are dry.  Eyes:     General:        Right eye: No discharge.        Left eye: No discharge.  Neck:     Musculoskeletal: Neck supple.  Cardiovascular:     Rate and Rhythm: Regular rhythm. Tachycardia present.     Heart sounds: Normal heart sounds.  Pulmonary:     Effort: Pulmonary effort is normal. Tachypnea present. No accessory muscle usage.     Breath sounds: Normal breath sounds. No wheezing or rales.  Abdominal:     Palpations: Abdomen is soft.     Tenderness: There is no abdominal tenderness.  Skin:    General: Skin is warm and dry.  Neurological:     Mental Status: He is alert.  Psychiatric:        Mood and Affect: Mood is not anxious.      ED Treatments / Results  Labs (all labs ordered are listed, but only abnormal results are displayed) Labs Reviewed  BLOOD GAS, VENOUS - Abnormal; Notable for the following components:      Result  Value   pH, Ven 7.196 (*)    pCO2, Ven 30.9 (*)    pO2, Ven <31.0 (*)    Bicarbonate 11.9 (*)    Acid-base deficit 15.0 (*)    All other components within normal limits  URINALYSIS, ROUTINE W REFLEX MICROSCOPIC - Abnormal; Notable for the following components:   APPearance CLOUDY (*)    Glucose, UA >=500 (*)    Hgb urine dipstick MODERATE (*)    Ketones, ur 80 (*)    Leukocytes,Ua MODERATE (*)  WBC, UA >50 (*)    All other components within normal limits  CBC WITH DIFFERENTIAL/PLATELET - Abnormal; Notable for the following components:   Hemoglobin 12.6 (*)    Platelets 476 (*)    Abs Immature Granulocytes 0.08 (*)    All other components within normal limits  BASIC METABOLIC PANEL - Abnormal; Notable for the following components:   Sodium 127 (*)    Potassium 6.1 (*)    Chloride 90 (*)    CO2 11 (*)    Glucose, Bld 515 (*)    BUN 26 (*)    Creatinine, Ser 1.26 (*)    Anion gap >20 (*)    All other components within normal limits  CBG MONITORING, ED - Abnormal; Notable for the following components:   Glucose-Capillary 481 (*)    All other components within normal limits  CBG MONITORING, ED - Abnormal; Notable for the following components:   Glucose-Capillary 464 (*)    All other components within normal limits  URINE CULTURE    EKG EKG Interpretation  Date/Time:  Friday October 17 2018 13:37:41 EDT Ventricular Rate:  125 PR Interval:    QRS Duration: 80 QT Interval:  314 QTC Calculation: 453 R Axis:   47 Text Interpretation:  Sinus tachycardia Ventricular premature complex Aberrant complex rate is faster compared to Oct 03 2018 Confirmed by Pricilla Loveless [phone]) on 10/17/2018 1:51:12 PM   Radiology No results found.  Procedures .Critical Care Performed by: Pricilla Loveless, MD Authorized by: Pricilla Loveless, MD   Critical care provider statement:    Critical care time (minutes):  35   Critical care time was exclusive of:  Separately billable procedures and  treating other patients   Critical care was necessary to treat or prevent imminent or life-threatening deterioration of the following conditions:  Endocrine crisis   Critical care was time spent personally by me on the following activities:  Development of treatment plan with patient or surrogate, discussions with consultants, evaluation of patient's response to treatment, examination of patient, obtaining history from patient or surrogate, ordering and performing treatments and interventions, ordering and review of laboratory studies, ordering and review of radiographic studies, pulse oximetry, re-evaluation of patient's condition and review of old charts   (including critical care time)  Medications Ordered in ED Medications  insulin regular, human (MYXREDLIN) 100 units/ 100 mL infusion (4 Units/hr Intravenous New Bag/Given 10/17/18 1544)  dextrose 5 %-0.45 % sodium chloride infusion (has no administration in time range)  cefTRIAXone (ROCEPHIN) 1 g in sodium chloride 0.9 % 100 mL IVPB (1 g Intravenous New Bag/Given 10/17/18 1613)  lactated ringers bolus 2,000 mL (2,000 mLs Intravenous New Bag/Given 10/17/18 1408)  LORazepam (ATIVAN) tablet 1 mg (1 mg Oral Given 10/17/18 1436)  ondansetron (ZOFRAN) injection 4 mg (4 mg Intravenous Given 10/17/18 1454)  acetaminophen (TYLENOL) tablet 650 mg (650 mg Oral Given 10/17/18 1454)  LORazepam (ATIVAN) injection 2 mg (2 mg Intravenous Given 10/17/18 1500)     Initial Impression / Assessment and Plan / ED Course  I have reviewed the triage vital signs and the nursing notes.  Pertinent labs & imaging results that were available during my care of the patient were reviewed by me and considered in my medical decision making (see chart for details).        Lab work confirms DKA.  Patient also appears intermittently anxious and jittery.  He later tells the nurse that instead of last using Ativan 1 month ago, he has run  out of his Ativan 5 days ago and cannot  get it refilled because of an argument with his doctor.  Could be in some benzodiazepine withdrawal as well and was given IV Ativan.  He has been given IV fluids and will need to start IV insulin.  He does not have any obvious acute ECG changes from the hyperkalemia and I think is reasonable to monitor him as his potassium should drop adequately with fluids and insulin. Dr. Adrian Blackwater to admit.  Final Clinical Impressions(s) / ED Diagnoses   Final diagnoses:  Diabetic ketoacidosis without coma associated with type 2 diabetes mellitus [facility])    ED Discharge Orders    None       Pricilla Loveless, MD 10/17/18 1623

## 2018-10-17 NOTE — ED Notes (Signed)
CRITICAL VALUE ALERT  Critical Value:  PH 7.196, P02 less than 31.0  Date & Time Notied: 10/17/2018, 1517  Provider Notified:   Dr. Criss Alvine  Orders Received/Actions taken: see chart

## 2018-10-17 NOTE — ED Notes (Signed)
Pt c/o burning with urination. Pt urinated while RN was in room and pt was yelling while he was urinating. Urine sample collected.

## 2018-10-17 NOTE — ED Triage Notes (Signed)
EMS reports pt was a resident of the [facility] but ''didn't get along" with the nurses so he left 2 days ago.  Pt lives at home with his sister at this time.  Reports pain in both legs and ems reports pt's cbg read High

## 2018-10-17 NOTE — ED Notes (Signed)
CRITICAL VALUE ALERT  Critical Value:  Glucose 515  Date & Time Notied:  10/17/2018, 1514  Provider Notified: Dr. Criss Alvine  Orders Received/Actions taken: See chart

## 2018-10-17 NOTE — H&P (Signed)
History and Physical  [name]:[id number] DOB: [birth year] DOA: 10/17/2018  Referring physician: Dr. Criss Alvine, ED physician PCP: Gennette Pac, NP  Outpatient Specialists:   Patient Coming From: Home  Chief Complaint: Nausea, vomiting  HPI: Omar Wood is a 58 y.o. male with a history of poorly controlled diabetes, bipolar, hypertension, tobacco use, hyperlipidemia.  Patient seen for elevated blood sugars over the past several days and combination with nausea, vomiting.  No palliating or provoking factors.  Here, the patient received antiemetics and was started on insulin and for fluids due to DKA.  The patient's vomiting is improving.  He has been afebrile  Emergency Department Course: White count normal at 9.9.  Glucose 515.  Creatinine 1.26.  Bicarb 11.  Anion gap greater than 20.  Shows pH of 7.196 with PCO2 of 30.9.  Review of Systems:   Pt denies any fevers, chills, nausea, vomiting, diarrhea, constipation, abdominal pain, shortness of breath, dyspnea on exertion, orthopnea, cough, wheezing, palpitations, headache, vision changes, lightheadedness, dizziness, melena, rectal bleeding.  Review of systems are otherwise negative  Past Medical History:  Diagnosis Date  . Bipolar disorder (HCC)   . Diabetes mellitus without complication (HCC)   . Hypercholesteremia   . Hypertension   . Marijuana user   . Scrotal injury   . Tobacco abuse    Past Surgical History:  Procedure Laterality Date  . APPENDECTOMY    . BIOPSY  09/30/2018   Procedure: BIOPSY;  Surgeon: Vida Rigger, MD;  Location: [facility] ENDOSCOPY;  Service: Endoscopy;;  . ESOPHAGEAL BRUSHING  09/30/2018   Procedure: ESOPHAGEAL BRUSHING;  Surgeon: Vida Rigger, MD;  Location: [facility] ENDOSCOPY;  Service: Endoscopy;;  . ESOPHAGOGASTRODUODENOSCOPY (EGD) WITH PROPOFOL N/A 09/30/2018   Procedure: ESOPHAGOGASTRODUODENOSCOPY (EGD) WITH PROPOFOL;  Surgeon: Vida Rigger, MD;  Location: [facility] ENDOSCOPY;  Service: Endoscopy;  Laterality:  N/A;   Social History:  reports that he has quit smoking. His smoking use included cigarettes. He has never used smokeless tobacco. He reports that he does not drink alcohol or use drugs. Patient lives at home  No Known Allergies  No family history on file.  Family history of diabetes, hypertension  Prior to Admission medications   Medication Sig Start Date End Date Taking? Authorizing Provider  alum & mag hydroxide-simeth (MAALOX/MYLANTA) 200-200-20 MG/5ML suspension Take 30 mLs by mouth every 6 (six) hours as needed for indigestion or heartburn. 10/04/18  Yes Glade Lloyd, MD  amLODipine (NORVASC) 5 MG tablet Take 1 tablet (5 mg total) by mouth daily. 10/05/18  Yes Glade Lloyd, MD  atorvastatin (LIPITOR) 20 MG tablet Take 20 mg by mouth daily.   Yes [provider]  DULoxetine (CYMBALTA) 60 MG capsule Take 60 mg by mouth 2 (two) times daily. 07/30/18  Yes [provider]  esomeprazole (NEXIUM) 40 MG capsule Take 1 capsule (40 mg total) by mouth 2 (two) times daily before a meal. 10/04/18  Yes Hanley Ben, Kshitiz, MD  Insulin Glargine (BASAGLAR KWIKPEN) 100 UNIT/ML SOPN Inject 0.45 mLs (45 Units total) into the skin at bedtime. Patient taking differently: Inject 40 Units into the skin at bedtime.  10/04/18  Yes Glade Lloyd, MD  insulin lispro (HUMALOG) 100 UNIT/ML injection Inject 5 Units into the skin 3 (three) times daily before meals.    Yes [provider]  Melatonin 3 MG TABS Take 3 mg by mouth at bedtime.   Yes [provider]  nystatin cream (MYCOSTATIN) Apply topically 2 (two) times daily. 10/04/18  Yes  Glade Lloyd, MD  ondansetron (ZOFRAN-ODT) 4 MG disintegrating tablet Take 1 tablet (4 mg total) by mouth every 6 (six) hours as needed for nausea or vomiting. 10/04/18  Yes Glade Lloyd, MD  propranolol (INDERAL) 10 MG tablet Take 10 mg by mouth 3 (three) times daily.   Yes [provider]  QUEtiapine (SEROQUEL) 400 MG tablet Take 400 mg by  mouth at bedtime. 07/30/18  Yes [provider]  traZODone (DESYREL) 50 MG tablet Take 50 mg by mouth at bedtime. 07/30/18  Yes [provider]    Physical Exam: BP (!) 166/98   Pulse (!) 111   Temp 98.3 F (36.8 C) (Oral)   Resp (!) 23   SpO2 93%   . General: Middle-age Caucasian male. Awake and alert and oriented x3. No acute cardiopulmonary distress.  Marland Kitchen HEENT: Normocephalic atraumatic.  Right and left ears normal in appearance.  Pupils equal, round, reactive to light. Extraocular muscles are intact. Sclerae anicteric and noninjected.  Moist mucosal membranes. No mucosal lesions.  . Neck: Neck supple without lymphadenopathy. No carotid bruits. No masses palpated.  . Cardiovascular: Regular rate with normal S1-S2 sounds. No murmurs, rubs, gallops auscultated. No JVD.  Marland Kitchen Respiratory: Good respiratory effort with no wheezes, rales, rhonchi. Lungs clear to auscultation bilaterally.  No accessory muscle use. . Abdomen: Soft, nontender, nondistended. Active bowel sounds. No masses or hepatosplenomegaly  . Skin: No rashes, lesions, or ulcerations.  Dry, warm to touch. 2+ dorsalis pedis and radial pulses. . Musculoskeletal: No calf or leg pain. All major joints not erythematous nontender.  No upper or lower joint deformation.  Good ROM.  No contractures  . Psychiatric: Intact judgment and insight. Pleasant and cooperative. . Neurologic: No focal neurological deficits. Strength is 5/5 and symmetric in upper and lower extremities.  Cranial nerves II through XII are grossly intact.           Labs on Admission: I have personally reviewed following labs and imaging studies  CBC: Recent Labs  Lab 10/17/18 1403  WBC 9.9  NEUTROABS 7.1  HGB 12.6*  HCT 39.1  MCV 91.1  PLT 476*   Basic Metabolic Panel: Recent Labs  Lab 10/17/18 1403  NA 127*  K 6.1*  CL 90*  CO2 11*  GLUCOSE 515*  BUN 26*  CREATININE 1.26*  CALCIUM 9.3   GFR: Estimated Creatinine Clearance: 63.3  mL/min (A) (by C-G formula based on SCr of 1.26 mg/dL (H)). Liver Function Tests: No results for input(s): AST, ALT, ALKPHOS, BILITOT, PROT, ALBUMIN in the last 168 hours. No results for input(s): LIPASE, AMYLASE in the last 168 hours. No results for input(s): AMMONIA in the last 168 hours. Coagulation Profile: No results for input(s): INR, PROTIME in the last 168 hours. Cardiac Enzymes: No results for input(s): CKTOTAL, CKMB, CKMBINDEX, TROPONINI in the last 168 hours. BNP (last 3 results) No results for input(s): PROBNP in the last 8760 hours. HbA1C: No results for input(s): HGBA1C in the last 72 hours. CBG: Recent Labs  Lab 10/17/18 1413 10/17/18 1539 10/17/18 1709  GLUCAP 481* 464* 437*   Lipid Profile: No results for input(s): CHOL, HDL, LDLCALC, TRIG, CHOLHDL, LDLDIRECT in the last 72 hours. Thyroid Function Tests: No results for input(s): TSH, T4TOTAL, FREET4, T3FREE, THYROIDAB in the last 72 hours. Anemia Panel: No results for input(s): VITAMINB12, FOLATE, FERRITIN, TIBC, IRON, RETICCTPCT in the last 72 hours. Urine analysis:    Component Value Date/Time   COLORURINE YELLOW 10/17/2018 1440   APPEARANCEUR CLOUDY (  A) 10/17/2018 1440   LABSPEC 1.017 10/17/2018 1440   PHURINE 5.0 10/17/2018 1440   GLUCOSEU >=500 (A) 10/17/2018 1440   HGBUR MODERATE (A) 10/17/2018 1440   BILIRUBINUR NEGATIVE 10/17/2018 1440   KETONESUR 80 (A) 10/17/2018 1440   PROTEINUR NEGATIVE 10/17/2018 1440   NITRITE NEGATIVE 10/17/2018 1440   LEUKOCYTESUR MODERATE (A) 10/17/2018 1440   Sepsis Labs: @LABRCNTIP (procalcitonin:4,lacticidven:4) )No results found for this or any previous visit (from the past 240 hour(s)).   Radiological Exams on Admission: No results found.   Assessment/Plan: Principal Problem:   DKA, type 1 (HCC) Active Problems:   Bipolar disorder (HCC)   Hypertension   Hypercholesteremia    This patient was discussed with the ED physician, including pertinent vitals,  physical exam findings, labs, and imaging.  We also discussed care given by the ED provider.  1. DKA admit to stepdown Telemetry monitoring Continue aggressive IV hydration CBGs every hour Basic metabolic panel every 4 hours Potassium replacement: none at this time Noncaloric liquids Transition to D5 normal saline at 150 mL's per hour with CBG at 250 Transition to home insulin regimen when gap is closed and acidosis resolved. 2. Bipolar a. Continue Seroquel 3. Hypertension a. Continue antihypertensives 4. Hypercholesterolemia  DVT prophylaxis: Lovenox Consultants: None Code Status: DNR Family Communication: None Disposition Plan: Patient should be able to return home following admission   Levie HeritageStinson, Jacob J, DO

## 2018-10-18 DIAGNOSIS — E7800 Pure hypercholesterolemia, unspecified: Secondary | ICD-10-CM

## 2018-10-18 DIAGNOSIS — E1110 Type 2 diabetes mellitus with ketoacidosis without coma: Secondary | ICD-10-CM

## 2018-10-18 DIAGNOSIS — I10 Essential (primary) hypertension: Secondary | ICD-10-CM

## 2018-10-18 DIAGNOSIS — F319 Bipolar disorder, unspecified: Secondary | ICD-10-CM

## 2018-10-18 LAB — BASIC METABOLIC PANEL
Anion gap: 10 (ref 5–15)
Anion gap: 12 (ref 5–15)
BUN: 16 mg/dL (ref 6–20)
BUN: 14 mg/dL (ref 6–20)
CHLORIDE: 96 mmol/L — AB (ref 98–111)
CO2: 22 mmol/L (ref 22–32)
CO2: 23 mmol/L (ref 22–32)
Calcium: 8.7 mg/dL — ABNORMAL LOW (ref 8.9–10.3)
Calcium: 8.9 mg/dL (ref 8.9–10.3)
Chloride: 99 mmol/L (ref 98–111)
Creatinine, Ser: 0.8 mg/dL (ref 0.61–1.24)
Creatinine, Ser: 0.74 mg/dL (ref 0.61–1.24)
GFR calc Af Amer: >60 mL/min (ref >60)
GFR calc Af Amer: >60 mL/min (ref >60)
GFR calc non Af Amer: >60 mL/min (ref >60)
GFR calc non Af Amer: >60 mL/min (ref >60)
Glucose, Bld: 122 mg/dL — ABNORMAL HIGH (ref 70–99)
Glucose, Bld: 155 mg/dL — ABNORMAL HIGH (ref 70–99)
POTASSIUM: 3.8 mmol/L (ref 3.5–5.1)
Potassium: 3.9 mmol/L (ref 3.5–5.1)
Sodium: 131 mmol/L — ABNORMAL LOW (ref 135–145)
Sodium: 131 mmol/L — ABNORMAL LOW (ref 135–145)

## 2018-10-18 LAB — BASIC METABOLIC PANEL WITH GFR
Anion gap: 15 (ref 5–15)
BUN: 15 mg/dL (ref 6–20)
CO2: 20 mmol/L — ABNORMAL LOW (ref 22–32)
Calcium: 8.7 mg/dL — ABNORMAL LOW (ref 8.9–10.3)
Chloride: 95 mmol/L — ABNORMAL LOW (ref 98–111)
Creatinine, Ser: 0.76 mg/dL (ref 0.61–1.24)
GFR calc Af Amer: >60 mL/min
GFR calc non Af Amer: >60 mL/min
Glucose, Bld: 272 mg/dL — ABNORMAL HIGH (ref 70–99)
Potassium: 4.2 mmol/L (ref 3.5–5.1)
Sodium: 130 mmol/L — ABNORMAL LOW (ref 135–145)

## 2018-10-18 LAB — GLUCOSE, CAPILLARY
GLUCOSE-CAPILLARY: 111 mg/dL — AB (ref 70–99)
Glucose-Capillary: 211 mg/dL — ABNORMAL HIGH (ref 70–99)
Glucose-Capillary: 272 mg/dL — ABNORMAL HIGH (ref 70–99)
Glucose-Capillary: 144 mg/dL — ABNORMAL HIGH (ref 70–99)
Glucose-Capillary: 109 mg/dL — ABNORMAL HIGH (ref 70–99)
Glucose-Capillary: 160 mg/dL — ABNORMAL HIGH (ref 70–99)

## 2018-10-18 LAB — MRSA PCR SCREENING: MRSA by PCR: NEGATIVE

## 2018-10-18 MED ORDER — INSULIN GLARGINE 100 UNIT/ML ~~LOC~~ SOLN
20.0000 [IU] | SUBCUTANEOUS | Status: AC
  Administered 2018-10-18: 20 [IU] via SUBCUTANEOUS
  Filled 2018-10-18: qty 0.2

## 2018-10-18 MED ORDER — INSULIN ASPART 100 UNIT/ML ~~LOC~~ SOLN
0.0000 [IU] | SUBCUTANEOUS | Status: DC
  Administered 2018-10-18: 2 [IU] via SUBCUTANEOUS
  Administered 2018-10-18: 3 [IU] via SUBCUTANEOUS
  Administered 2018-10-18: 8 [IU] via SUBCUTANEOUS
  Administered 2018-10-19: 5 [IU] via SUBCUTANEOUS
  Administered 2018-10-19: 8 [IU] via SUBCUTANEOUS
  Administered 2018-10-19 – 2018-10-20 (×3): 3 [IU] via SUBCUTANEOUS
  Administered 2018-10-20: 2 [IU] via SUBCUTANEOUS
  Administered 2018-10-21: 3 [IU] via SUBCUTANEOUS
  Administered 2018-10-21 (×2): 5 [IU] via SUBCUTANEOUS

## 2018-10-18 MED ORDER — INSULIN GLARGINE 100 UNIT/ML ~~LOC~~ SOLN
40.0000 [IU] | Freq: Every day | SUBCUTANEOUS | Status: DC
  Administered 2018-10-19: 20 [IU] via SUBCUTANEOUS
  Administered 2018-10-20 – 2018-10-21 (×2): 40 [IU] via SUBCUTANEOUS
  Filled 2018-10-18 (×5): qty 0.4

## 2018-10-18 MED ORDER — LORAZEPAM 0.5 MG PO TABS
0.5000 mg | ORAL_TABLET | Freq: Once | ORAL | Status: AC
  Administered 2018-10-18: 0.5 mg via ORAL
  Filled 2018-10-18: qty 1

## 2018-10-18 MED ORDER — INSULIN ASPART 100 UNIT/ML ~~LOC~~ SOLN: 0.0000 [IU] | SUBCUTANEOUS | Status: DC

## 2018-10-18 MED ORDER — INSULIN REGULAR(HUMAN) IN NACL 100-0.9 UT/100ML-% IV SOLN: INTRAVENOUS | Status: AC

## 2018-10-18 MED ORDER — SODIUM CHLORIDE 0.9 % IV SOLN
INTRAVENOUS | Status: DC
  Administered 2018-10-18 – 2018-10-20 (×3): via INTRAVENOUS

## 2018-10-18 MED ORDER — INSULIN GLARGINE 100 UNIT/ML ~~LOC~~ SOLN
20.0000 [IU] | Freq: Every day | SUBCUTANEOUS | Status: DC
  Administered 2018-10-18: 20 [IU] via SUBCUTANEOUS
  Filled 2018-10-18 (×3): qty 0.2

## 2018-10-18 MED ORDER — INSULIN GLARGINE 100 UNIT/ML ~~LOC~~ SOLN
20.0000 [IU] | Freq: Every day | SUBCUTANEOUS | Status: DC
  Filled 2018-10-18 (×3): qty 0.2

## 2018-10-18 MED ORDER — INSULIN ASPART 100 UNIT/ML ~~LOC~~ SOLN
5.0000 [IU] | Freq: Three times a day (TID) | SUBCUTANEOUS | Status: DC
  Administered 2018-10-18 – 2018-10-21 (×7): 5 [IU] via SUBCUTANEOUS

## 2018-10-18 NOTE — Progress Notes (Signed)
Patient refusing to be restuck. Patient cussing at staff. Stated he is not able to sleep due to beeping from IV pump. Mid level made aware.

## 2018-10-18 NOTE — Progress Notes (Signed)
Paged mid level due to patient refusing to have iv placed. Patient also requested ativan. Mid level placed one time order for ativan 0.5mg  po. Will continue to monitor patient throughout shift.

## 2018-10-18 NOTE — Progress Notes (Signed)
PROGRESS NOTE    Omar Wood  NAT:[id number] DOB: 10-09-1960 DOA: 10/17/2018 PCP: Gennette Pac, NP    Brief Narrative:  58 year old male with a history of insulin-dependent diabetes, bipolar disorder, hypertension, admitted to the hospital with persistent nausea and vomiting.  Found to be in diabetic ketoacidosis and admitted for IV fluids and IV insulin.   Assessment & Plan:   Principal Problem:   DKA, type 1 (HCC) Active Problems:   Bipolar disorder (HCC)   Hypertension   Hypercholesteremia   1. Diabetic ketoacidosis.  Patient reports that he has been compliant with his insulin at home.  He presented with hyperglycemia, dehydration.  He was treated with intravenous insulin and IV fluids.  Overall anion gap is closed.  He has been transitioned off of intravenous insulin and has been started on Lantus.  Will resume his home dose.  He is currently still n.p.o., will start him on carb modified diet.  He is still tachycardic and likely needs more IV hydration.  Continue current treatments and monitor blood sugars.  He does not have any obvious source of infection. 2. Bipolar disorder.  Continue Seroquel. 3. Hypertension.  Continue on Norvasc. 4. Hyperlipidemia.  Continue statin   DVT prophylaxis: lovenox Code Status: DNR Family Communication: no family present Disposition Plan: discharge home if blood sugars remain stable and able to tolerate po, likely in next 24 hours   Consultants:     Procedures:     Antimicrobials:       Subjective: Nausea and vomiting is better. No po intake thus far. No abdominal pain  Objective: Vitals:   10/18/18 1000 10/18/18 1115 10/18/18 1200 10/18/18 1300  BP:   (!) 153/90 133/85  Pulse: (!) 110 (!) 107 95 98  Resp: 18 13 17 18   Temp:  98.4 F (36.9 C)    TempSrc:  Oral    SpO2: 96% 99% 99% 97%    Intake/Output Summary (Last 24 hours) at 10/18/2018 1625 Last data filed at 10/18/2018 1300 Gross per 24 hour  Intake 673.06 ml   Output 525 ml  Net 148.06 ml   There were no vitals filed for this visit.  Examination:  General exam: Appears calm and comfortable  Respiratory system: Clear to auscultation. Respiratory effort normal. Cardiovascular system: S1 & S2 heard, tachycardic. No JVD, murmurs, rubs, gallops or clicks. No pedal edema. Gastrointestinal system: Abdomen is nondistended, soft and nontender. No organomegaly or masses felt. Normal bowel sounds heard. Central nervous system: Alert and oriented. No focal neurological deficits. Extremities: Symmetric 5 x 5 power. Skin: No rashes, lesions or ulcers Psychiatry: Judgement and insight appear normal. Mood & affect appropriate.     Data Reviewed: I have personally reviewed following labs and imaging studies  CBC: Recent Labs  Lab 10/17/18 1403  WBC 9.9  NEUTROABS 7.1  HGB 12.6*  HCT 39.1  MCV 91.1  PLT 476*   Basic Metabolic Panel: Recent Labs  Lab 10/17/18 1811 10/17/18 2110 10/18/18 0136 10/18/18 0633 10/18/18 1127  NA 127* 129* 131* 130* 131*  K 4.3 4.4 3.8 4.2 3.9  CL 95* 96* 99 95* 96*  CO2 14* 19* 22 20* 23  GLUCOSE 286* 155* 122* 272* 155*  BUN 23* 19 16 15 14   CREATININE 1.21 0.88 0.80 0.76 0.74  CALCIUM 8.5* 8.6* 8.7* 8.7* 8.9   GFR: CrCl cannot be calculated (Unknown ideal weight.). Liver Function Tests: No results for input(s): AST, ALT, ALKPHOS, BILITOT, PROT, ALBUMIN in the last 168 hours.  No results for input(s): LIPASE, AMYLASE in the last 168 hours. No results for input(s): AMMONIA in the last 168 hours. Coagulation Profile: No results for input(s): INR, PROTIME in the last 168 hours. Cardiac Enzymes: No results for input(s): CKTOTAL, CKMB, CKMBINDEX, TROPONINI in the last 168 hours. BNP (last 3 results) No results for input(s): PROBNP in the last 8760 hours. HbA1C: No results for input(s): HGBA1C in the last 72 hours. CBG: Recent Labs  Lab 10/17/18 2335 10/18/18 0139 10/18/18 0338 10/18/18 0637 10/18/18  1114  GLUCAP 134* 111* 211* 272* 144*   Lipid Profile: No results for input(s): CHOL, HDL, LDLCALC, TRIG, CHOLHDL, LDLDIRECT in the last 72 hours. Thyroid Function Tests: No results for input(s): TSH, T4TOTAL, FREET4, T3FREE, THYROIDAB in the last 72 hours. Anemia Panel: No results for input(s): VITAMINB12, FOLATE, FERRITIN, TIBC, IRON, RETICCTPCT in the last 72 hours. Sepsis Labs: No results for input(s): PROCALCITON, LATICACIDVEN in the last 168 hours.  No results found for this or any previous visit (from the past 240 hour(s)).       Radiology Studies: No results found.      Scheduled Meds: . amLODipine  5 mg Oral Daily  . atorvastatin  20 mg Oral q1800  . DULoxetine  60 mg Oral BID  . enoxaparin (LOVENOX) injection  40 mg Subcutaneous Q24H  . insulin aspart  0-15 Units Subcutaneous Q4H  . insulin aspart  5 Units Subcutaneous TID WC  . [START ON 10/19/2018] insulin glargine  40 Units Subcutaneous Daily  . Melatonin  3 mg Oral QHS  . pantoprazole  40 mg Oral Daily  . propranolol  10 mg Oral TID  . QUEtiapine  400 mg Oral QHS  . traZODone  50 mg Oral QHS   Continuous Infusions: . sodium chloride    . fluconazole (DIFLUCAN) IV       LOS: 1 day    Time spent:    Erick Blinks, MD [facility]   If 7PM-7AM, please contact night-coverage [email or web address]  10/18/2018, 4:25 PM

## 2018-10-19 LAB — BASIC METABOLIC PANEL
ANION GAP: 10 (ref 5–15)
BUN: 16 mg/dL (ref 6–20)
CO2: 24 mmol/L (ref 22–32)
Calcium: 8.9 mg/dL (ref 8.9–10.3)
Chloride: 102 mmol/L (ref 98–111)
Creatinine, Ser: 0.69 mg/dL (ref 0.61–1.24)
GFR calc Af Amer: >60 mL/min (ref >60)
GFR calc non Af Amer: >60 mL/min (ref >60)
Glucose, Bld: 160 mg/dL — ABNORMAL HIGH (ref 70–99)
Potassium: 3.2 mmol/L — ABNORMAL LOW (ref 3.5–5.1)
Sodium: 136 mmol/L (ref 135–145)

## 2018-10-19 LAB — GLUCOSE, CAPILLARY
GLUCOSE-CAPILLARY: 194 mg/dL — AB (ref 70–99)
Glucose-Capillary: 163 mg/dL — ABNORMAL HIGH (ref 70–99)
Glucose-Capillary: 96 mg/dL (ref 70–99)
Glucose-Capillary: 222 mg/dL — ABNORMAL HIGH (ref 70–99)
Glucose-Capillary: 104 mg/dL — ABNORMAL HIGH (ref 70–99)
Glucose-Capillary: 285 mg/dL — ABNORMAL HIGH (ref 70–99)

## 2018-10-19 LAB — CBC
HCT: 31.9 % — ABNORMAL LOW (ref 39.0–52.0)
Hemoglobin: 10.7 g/dL — ABNORMAL LOW (ref 13.0–17.0)
MCH: 29.4 pg (ref 26.0–34.0)
MCHC: 33.5 g/dL (ref 30.0–36.0)
MCV: 87.6 fL (ref 80.0–100.0)
Platelets: 426 10*3/uL — ABNORMAL HIGH (ref 150–400)
RBC: 3.64 MIL/uL — ABNORMAL LOW (ref 4.22–5.81)
RDW: 13.2 % (ref 11.5–15.5)
WBC: 7.6 10*3/uL (ref 4.0–10.5)
nRBC: 0 % (ref 0.0–0.2)

## 2018-10-19 MED ORDER — INSULIN GLARGINE 100 UNIT/ML ~~LOC~~ SOLN
20.0000 [IU] | SUBCUTANEOUS | Status: AC
  Filled 2018-10-19: qty 0.2

## 2018-10-19 MED ORDER — METOCLOPRAMIDE HCL 5 MG/ML IJ SOLN
5.0000 mg | Freq: Four times a day (QID) | INTRAMUSCULAR | Status: DC
  Administered 2018-10-19 – 2018-10-20 (×6): 5 mg via INTRAVENOUS
  Filled 2018-10-19 (×7): qty 2

## 2018-10-19 MED ORDER — HYDROCODONE-ACETAMINOPHEN 5-325 MG PO TABS
1.0000 | ORAL_TABLET | Freq: Four times a day (QID) | ORAL | Status: DC | PRN
  Administered 2018-10-19 – 2018-10-20 (×2): 1 via ORAL
  Filled 2018-10-19 (×2): qty 1

## 2018-10-19 MED ORDER — ONDANSETRON 4 MG PO TBDP: 4.0000 mg | ORAL_TABLET | Freq: Four times a day (QID) | ORAL | 0 refills | Status: AC | PRN

## 2018-10-19 MED ORDER — ACETAMINOPHEN 325 MG PO TABS
650.0000 mg | ORAL_TABLET | Freq: Four times a day (QID) | ORAL | Status: DC | PRN
  Administered 2018-10-20: 650 mg via ORAL
  Filled 2018-10-19: qty 2

## 2018-10-19 MED ORDER — HYDROXYZINE HCL 25 MG PO TABS
25.0000 mg | ORAL_TABLET | Freq: Three times a day (TID) | ORAL | Status: DC | PRN
  Administered 2018-10-19: 25 mg via ORAL
  Filled 2018-10-19: qty 1

## 2018-10-19 MED ORDER — POTASSIUM CHLORIDE CRYS ER 20 MEQ PO TBCR
40.0000 meq | EXTENDED_RELEASE_TABLET | Freq: Once | ORAL | Status: AC
  Administered 2018-10-19: 40 meq via ORAL
  Filled 2018-10-19: qty 2

## 2018-10-19 MED ORDER — INSULIN LISPRO 100 UNIT/ML ~~LOC~~ SOLN: 5.0000 [IU] | Freq: Three times a day (TID) | SUBCUTANEOUS | 11 refills | Status: AC

## 2018-10-19 MED ORDER — POTASSIUM CHLORIDE 10 MEQ/100ML IV SOLN
10.0000 meq | INTRAVENOUS | Status: DC
  Administered 2018-10-19: 10 meq via INTRAVENOUS
  Filled 2018-10-19: qty 100

## 2018-10-19 MED ORDER — METOCLOPRAMIDE HCL 5 MG PO TABS: 5.0000 mg | ORAL_TABLET | Freq: Three times a day (TID) | ORAL | 1 refills | Status: AC

## 2018-10-19 MED ORDER — BASAGLAR KWIKPEN 100 UNIT/ML ~~LOC~~ SOPN: 40.0000 [IU] | PEN_INJECTOR | Freq: Every day | SUBCUTANEOUS | 1 refills | Status: AC

## 2018-10-19 NOTE — Progress Notes (Signed)
Patient able to ambulate to door using [place]. Upon reaching door, patient stated "I can't do this" and wanted to turn around. States he feels dizzy and has pain in his legs.

## 2018-10-19 NOTE — Progress Notes (Signed)
PROGRESS NOTE    Omar Wood  FBP:[id number] DOB: 01/01/1961 DOA: 10/17/2018 PCP: Gennette Pac, NP    Brief Narrative:  58 year old male with a history of insulin-dependent diabetes, bipolar disorder, hypertension, admitted to the hospital with persistent nausea and vomiting.  Found to be in diabetic ketoacidosis and admitted for IV fluids and IV insulin.   Assessment & Plan:   Principal Problem:   DKA, type 1 (HCC) Active Problems:   Bipolar disorder (HCC)   Hypertension   Hypercholesteremia   1. Diabetic ketoacidosis.  Patient reports that he has been compliant with his insulin at home, but he has a history of noncompliance.  He presented with hyperglycemia, dehydration and was found to be in DKA.  He was treated with intravenous insulin and IV fluids.  Overall anion gap is closed.  He has been transitioned off of intravenous insulin and is currently on Lantus.  Diet has been advanced, p.o. intake has been poor.  He reports having nausea, but not has not been having vomiting. Continue current treatments and monitor blood sugars.  He does not have any obvious source of infection. 2. Nausea and vomiting.  Continue antiemetics.  Will give a trial of Reglan since he may have some gastroparesis 3. Bipolar disorder.  Continue Seroquel. 4. Hypertension.  Continue on Norvasc. 5. Hyperlipidemia.  Continue statin   DVT prophylaxis: lovenox Code Status: DNR Family Communication: no family present Disposition Plan: Further disposition pending physical therapy evaluation.  He was recently discharged on 3/14 at which time skilled nursing facility was recommended, but patient refused at that time.  He says he is willing to go to skilled nursing facility at this time.   Consultants:     Procedures:     Antimicrobials:       Subjective: Still has some nausea, no vomiting.  P.o. intake has been poor.  Says he has been falling at home lately.  Has difficulty walking.  Objective:  Vitals:   10/18/18 2358 10/19/18 0613 10/19/18 1100 10/19/18 1418  BP: 112/67 94/66 131/87 114/67  Pulse: (!) 104 100 (!) 107 (!) 102  Resp: 18 16  16   Temp: 98.5 F (36.9 C) 98.4 F (36.9 C) 98.2 F (36.8 C) 98.3 F (36.8 C)  TempSrc: Oral Oral Oral Oral  SpO2: 96% 96%  98%    Intake/Output Summary (Last 24 hours) at 10/19/2018 1723 Last data filed at 10/19/2018 1657 Gross per 24 hour  Intake 1223.31 ml  Output 200 ml  Net 1023.31 ml   There were no vitals filed for this visit.  Examination:  General exam: Appears calm and comfortable  Respiratory system: Clear to auscultation. Respiratory effort normal. Cardiovascular system: S1 & S2 heard, tachycardic. No JVD, murmurs, rubs, gallops or clicks. No pedal edema. Gastrointestinal system: Abdomen is nondistended, soft and nontender. No organomegaly or masses felt. Normal bowel sounds heard. Central nervous system: Alert and oriented. No focal neurological deficits. Extremities: Symmetric 5 x 5 power. Skin: No rashes, lesions or ulcers Psychiatry: Judgement and insight appear normal. Mood & affect appropriate.     Data Reviewed: I have personally reviewed following labs and imaging studies  CBC: Recent Labs  Lab 10/17/18 1403 10/19/18 0459  WBC 9.9 7.6  NEUTROABS 7.1  --   HGB 12.6* 10.7*  HCT 39.1 31.9*  MCV 91.1 87.6  PLT 476* 426*   Basic Metabolic Panel: Recent Labs  Lab 10/17/18 2110 10/18/18 0136 10/18/18 0633 10/18/18 1127 10/19/18 0459  NA 129* 131*  130* 131* 136  K 4.4 3.8 4.2 3.9 3.2*  CL 96* 99 95* 96* 102  CO2 19* 22 20* 23 24  GLUCOSE 155* 122* 272* 155* 160*  BUN 19 16 15 14 16   CREATININE 0.88 0.80 0.76 0.74 0.69  CALCIUM 8.6* 8.7* 8.7* 8.9 8.9   GFR: CrCl cannot be calculated (Unknown ideal weight.). Liver Function Tests: No results for input(s): AST, ALT, ALKPHOS, BILITOT, PROT, ALBUMIN in the last 168 hours. No results for input(s): LIPASE, AMYLASE in the last 168 hours. No results  for input(s): AMMONIA in the last 168 hours. Coagulation Profile: No results for input(s): INR, PROTIME in the last 168 hours. Cardiac Enzymes: No results for input(s): CKTOTAL, CKMB, CKMBINDEX, TROPONINI in the last 168 hours. BNP (last 3 results) No results for input(s): PROBNP in the last 8760 hours. HbA1C: No results for input(s): HGBA1C in the last 72 hours. CBG: Recent Labs  Lab 10/19/18 0002 10/19/18 0418 10/19/18 0735 10/19/18 1113 10/19/18 1603  GLUCAP 194* 163* 96 222* 104*   Lipid Profile: No results for input(s): CHOL, HDL, LDLCALC, TRIG, CHOLHDL, LDLDIRECT in the last 72 hours. Thyroid Function Tests: No results for input(s): TSH, T4TOTAL, FREET4, T3FREE, THYROIDAB in the last 72 hours. Anemia Panel: No results for input(s): VITAMINB12, FOLATE, FERRITIN, TIBC, IRON, RETICCTPCT in the last 72 hours. Sepsis Labs: No results for input(s): PROCALCITON, LATICACIDVEN in the last 168 hours.  Recent Results (from the past 240 hour(s))  MRSA PCR Screening     Status: None   Collection Time: 10/17/18  7:05 PM  Result Value Ref Range Status   MRSA by PCR NEGATIVE NEGATIVE Final    Comment:        The GeneXpert MRSA Assay (FDA approved for NASAL specimens only), is one component of a comprehensive MRSA colonization surveillance program. It is not intended to diagnose MRSA infection nor to guide or monitor treatment for MRSA infections. Performed at [facility], [address], [place], [place] [address]          Radiology Studies: No results found.      Scheduled Meds: . amLODipine  5 mg Oral Daily  . atorvastatin  20 mg Oral q1800  . DULoxetine  60 mg Oral BID  . enoxaparin (LOVENOX) injection  40 mg Subcutaneous Q24H  . insulin aspart  0-15 Units Subcutaneous Q4H  . insulin aspart  5 Units Subcutaneous TID WC  . insulin glargine  20 Units Subcutaneous NOW  . insulin glargine  40 Units Subcutaneous Daily  . Melatonin  3 mg Oral QHS  .  metoCLOPramide (REGLAN) injection  5 mg Intravenous Q6H  . pantoprazole  40 mg Oral Daily  . potassium chloride  40 mEq Oral Once  . propranolol  10 mg Oral TID  . QUEtiapine  400 mg Oral QHS  . traZODone  50 mg Oral QHS   Continuous Infusions: . sodium chloride 125 mL/hr at 10/19/18 1657  . fluconazole (DIFLUCAN) IV 200 mg (10/18/18 1737)     LOS: 2 days    Time spent:    Erick Blinks, MD [facility]   If 7PM-7AM, please contact night-coverage [email or web address]  10/19/2018, 5:23 PM

## 2018-10-19 NOTE — Progress Notes (Signed)
Pt requesting RN to disconnect potassium. States he can't stand the burning even when it's diluted, but would be agreeable to oral potassium. Paged Dr. Kerry Hough and obtained order.

## 2018-10-20 LAB — GLUCOSE, CAPILLARY
GLUCOSE-CAPILLARY: 97 mg/dL (ref 70–99)
Glucose-Capillary: 102 mg/dL — ABNORMAL HIGH (ref 70–99)
Glucose-Capillary: 104 mg/dL — ABNORMAL HIGH (ref 70–99)
Glucose-Capillary: 123 mg/dL — ABNORMAL HIGH (ref 70–99)
Glucose-Capillary: 181 mg/dL — ABNORMAL HIGH (ref 70–99)
Glucose-Capillary: 92 mg/dL (ref 70–99)

## 2018-10-20 LAB — BASIC METABOLIC PANEL
Anion gap: 7 (ref 5–15)
BUN: 15 mg/dL (ref 6–20)
CO2: 23 mmol/L (ref 22–32)
Calcium: 8.5 mg/dL — ABNORMAL LOW (ref 8.9–10.3)
Chloride: 107 mmol/L (ref 98–111)
Creatinine, Ser: 0.56 mg/dL — ABNORMAL LOW (ref 0.61–1.24)
GFR calc non Af Amer: >60 mL/min (ref >60)
Glucose, Bld: 107 mg/dL — ABNORMAL HIGH (ref 70–99)
Potassium: 3.8 mmol/L (ref 3.5–5.1)
Sodium: 137 mmol/L (ref 135–145)

## 2018-10-20 LAB — MAGNESIUM: MAGNESIUM: 1.8 mg/dL (ref 1.7–2.4)

## 2018-10-20 MED ORDER — GABAPENTIN 100 MG PO CAPS
100.0000 mg | ORAL_CAPSULE | Freq: Two times a day (BID) | ORAL | Status: DC
  Administered 2018-10-20 – 2018-10-21 (×3): 100 mg via ORAL
  Filled 2018-10-20 (×3): qty 1

## 2018-10-20 MED ORDER — FLUCONAZOLE 100 MG PO TABS
200.0000 mg | ORAL_TABLET | Freq: Every day | ORAL | Status: DC
  Administered 2018-10-20 – 2018-10-21 (×2): 200 mg via ORAL
  Filled 2018-10-20 (×2): qty 2

## 2018-10-20 NOTE — NC FL2 (Signed)
[place] MEDICAID FL2 LEVEL OF CARE SCREENING TOOL     IDENTIFICATION  Patient Name: Omar Wood Birthdate: August 08, 1960 Sex: male Admission Date (Current Location): 10/17/2018  [place] and [place] Number:  Aaron Edelman [id number] P Facility and Address:  [facility],  [address] [place] [address]      Provider Number: [id number]  Attending Physician Name and Address:  Erick Blinks, MD  Relative Name and Phone Number:       Current Level of Care: Hospital Recommended Level of Care: Skilled Nursing Facility Prior Approval Number:    Date Approved/Denied:   PASRR Number: [id number])  Discharge Plan: SNF    Current Diagnoses: Patient Active Problem List   Diagnosis Date Noted  . DKA, type 1 (HCC) 10/17/2018  . Bipolar disorder (HCC)   . Hypertension   . Hypercholesteremia   . DKA (diabetic ketoacidoses) (HCC) 09/17/2018    Orientation RESPIRATION BLADDER Height & Weight     Self, Time, Situation, Place  Normal Continent Weight:   Height:     BEHAVIORAL SYMPTOMS/MOOD NEUROLOGICAL BOWEL NUTRITION STATUS      Continent Diet(heart healthy/carb modified)  AMBULATORY STATUS COMMUNICATION OF NEEDS Skin   Limited Assist Verbally Normal                       Personal Care Assistance Level of Assistance  Bathing, Feeding, Dressing Bathing Assistance: Limited assistance Feeding assistance: Independent Dressing Assistance: Limited assistance     Functional Limitations Info  Sight, Hearing, Speech Sight Info: Adequate Hearing Info: Adequate Speech Info: Adequate    SPECIAL CARE FACTORS FREQUENCY  PT (By licensed PT)     PT Frequency: 5x/week              Contractures Contractures Info: Not present    Additional Factors Info  Code Status, Psychotropic, Allergies Code Status Info: DNR Allergies Info: NKA Psychotropic Info: Cymbalta, Seroquel, Desyrel         Current Medications (10/20/2018):  This is the current  hospital active medication list Current Facility-Administered Medications  Medication Dose Route Frequency Provider Last Rate Last Dose  . 0.9 %  sodium chloride infusion   Intravenous Continuous Erick Blinks, MD 125 mL/hr at 10/20/18 0056    . acetaminophen (TYLENOL) tablet 650 mg  650 mg Oral Q6H PRN Bodenheimer, Charles A, NP      . alum & mag hydroxide-simeth (MAALOX/MYLANTA) 200-200-20 MG/5ML suspension 30 mL  30 mL Oral Q6H PRN Levie Heritage, DO   30 mL at 10/19/18 2006  . amLODipine (NORVASC) tablet 5 mg  5 mg Oral Daily Levie Heritage, DO   5 mg at 10/20/18 8466  . atorvastatin (LIPITOR) tablet 20 mg  20 mg Oral q1800 Levie Heritage, DO   20 mg at 10/19/18 1733  . DULoxetine (CYMBALTA) DR capsule 60 mg  60 mg Oral BID Levie Heritage, DO   60 mg at 10/20/18 0818  . enoxaparin (LOVENOX) injection 40 mg  40 mg Subcutaneous Q24H Levie Heritage, DO   40 mg at 10/19/18 2135  . fluconazole (DIFLUCAN) tablet 200 mg  200 mg Oral Daily Erick Blinks, MD   200 mg at 10/20/18 1056  . gabapentin (NEURONTIN) capsule 100 mg  100 mg Oral BID Erick Blinks, MD   100 mg at 10/20/18 1147  . HYDROcodone-acetaminophen (NORCO/VICODIN) 5-325 MG per tablet 1-2 tablet  1-2 tablet Oral Q6H PRN Macon Large, NP   1 tablet at 10/20/18  7741  . hydrOXYzine (ATARAX/VISTARIL) tablet 25 mg  25 mg Oral TID PRN Erick Blinks, MD   25 mg at 10/19/18 1733  . insulin aspart (novoLOG) injection 0-15 Units  0-15 Units Subcutaneous Q4H Levie Heritage, DO   2 Units at 10/20/18 2878  . insulin aspart (novoLOG) injection 5 Units  5 Units Subcutaneous TID WC Erick Blinks, MD   5 Units at 10/20/18 1148  . insulin glargine (LANTUS) injection 40 Units  40 Units Subcutaneous Daily Erick Blinks, MD   40 Units at 10/20/18 1056  . Melatonin TABS 3 mg  3 mg Oral QHS Levie Heritage, DO   3 mg at 10/19/18 2136  . metoCLOPramide (REGLAN) injection 5 mg  5 mg Intravenous Q6H Erick Blinks, MD   5 mg at  10/20/18 1147  . ondansetron (ZOFRAN-ODT) disintegrating tablet 4 mg  4 mg Oral Q6H PRN Levie Heritage, DO      . pantoprazole (PROTONIX) EC tablet 40 mg  40 mg Oral Daily Levie Heritage, DO   40 mg at 10/20/18 6767  . propranolol (INDERAL) tablet 10 mg  10 mg Oral TID Levie Heritage, DO   10 mg at 10/20/18 2094  . QUEtiapine (SEROQUEL) tablet 400 mg  400 mg Oral QHS Levie Heritage, DO   400 mg at 10/19/18 2136  . traZODone (DESYREL) tablet 50 mg  50 mg Oral QHS Levie Heritage, DO   50 mg at 10/19/18 2136     Discharge Medications: Please see discharge summary for a list of discharge medications.  Relevant Imaging Results:  Relevant Lab Results:   Additional Information SSN [id number] Juleen China, LCSW

## 2018-10-20 NOTE — Plan of Care (Signed)
  Problem: Acute Rehab PT Goals(only PT should resolve) Goal: Pt Will Go Supine/Side To Sit Outcome: Progressing Flowsheets (Taken 10/20/2018 0958) Pt will go Supine/Side to Sit: with modified independence Goal: Patient Will Transfer Sit To/From Stand Outcome: Progressing Flowsheets (Taken 10/20/2018 [phone]) Patient will transfer sit to/from stand: with min guard assist Goal: Pt Will Transfer Bed To Chair/Chair To Bed Outcome: Progressing Flowsheets (Taken 10/20/2018 0958) Pt will Transfer Bed to Chair/Chair to Bed: min guard assist Goal: Pt Will Ambulate Outcome: Progressing Flowsheets (Taken 10/20/2018 0958) Pt will Ambulate: 50 feet; with rolling Reinig; with min guard assist   9:59 AM, 10/20/18 [name], MPT Physical Therapist with [facility] [phone] office [phone] mobile phone

## 2018-10-20 NOTE — Progress Notes (Signed)
Inpatient Diabetes Program Recommendations  AACE/ADA: New Consensus Statement on Inpatient Glycemic Control (2015)  Target Ranges:  Prepandial:   less than 140 mg/dL      Peak postprandial:   less than 180 mg/dL (1-2 hours)      Critically ill patients:  140 - 180 mg/dL   Lab Results  Component Value Date   GLUCAP 123 (H) 10/20/2018   HGBA1C 11.4 (H) 09/19/2018    Review of Glycemic Control  Diabetes history: DM2 Outpatient Diabetes medications: Lantus 40 units daily + Novolog 5 units tid meal coverage Current orders for Inpatient glycemic control: Lantus 40 units + Novolog 5 units tid meal coverage + Novolog moderate correction q 4 hrs.  Inpatient Diabetes Program Recommendations:   Noted diet changed from carb modified to low sodium only. Spoke with patient by phone (DM coordinator not on [facility]) Reviewed with patient diabetes management leading up to his admission. Patient states he "tries to take insulin" as prescribed." Reviewed with patient to take insulin as prescribed along with sick day rules and patient acknowledges understanding but did not share how often he misses insulin doses. Reviewed risks of elevated CBGs along with DKA. DM coordinator has spoken with patient on previous admission regarding A1c of 11.4 09/19/18. Will follow patient during hospitalization.  Thank you, Omar Wood. Star Cheese, RN, MSN, CDE  Diabetes Coordinator Inpatient Glycemic Control Team Team Pager [phone] (8am-5pm) 10/20/2018 10:31 AM

## 2018-10-20 NOTE — Evaluation (Signed)
Physical Therapy Evaluation Patient Details Name: Omar Wood MRN: [id number] DOB: 18-Aug-1960 Today's Date: 10/20/2018   History of Present Illness  EFTHIMIOS Wood is a 58 y.o. male with a history of poorly controlled diabetes, bipolar, hypertension, tobacco use, hyperlipidemia.  Patient seen for elevated blood sugars over the past several days and combination with nausea, vomiting.  No palliating or provoking factors.  Here, the patient received antiemetics and was started on insulin and for fluids due to DKA.  The patient's vomiting is improving.  He has been afebrile    Clinical Impression  Patient limited for functional mobility as stated below secondary to BLE weakness, fatigue and poor standing balance.  Patient demonstrates impulsive behavior, easily agitated and declined to sit up in chair due to c/o fatigue.  Patient will benefit from continued physical therapy in hospital and recommended venue below to increase strength, balance, endurance for safe ADLs and gait.    Follow Up Recommendations SNF    Equipment Recommendations  None recommended by PT    Recommendations for Other Services       Precautions / Restrictions Precautions Precautions: Fall Restrictions Weight Bearing Restrictions: No      Mobility  Bed Mobility Overal bed mobility: Needs Assistance Bed Mobility: Supine to Sit;Sit to Supine     Supine to sit: Supervision Sit to supine: Supervision   General bed mobility comments: slightly labored movement  Transfers Overall transfer level: Needs assistance Equipment used: 1 person hand held assist Transfers: Sit to/from Stand;Stand Pivot Transfers Sit to Stand: Min assist Stand pivot transfers: Min assist       General transfer comment: labored unsteady movement  Ambulation/Gait Ambulation/Gait assistance: Min assist Gait Distance (Feet): 4 Feet Assistive device: 1 person hand held assist Gait Pattern/deviations: Decreased step length -  right;Decreased step length - left;Decreased stride length Gait velocity: decreased   General Gait Details: limited to 4-5 steps at bedside due to c/o fatigue  Stairs            Wheelchair Mobility    Modified Rankin (Stroke Patients Only)       Balance Overall balance assessment: Needs assistance Sitting-balance support: Feet supported;Bilateral upper extremity supported Sitting balance-Leahy Scale: Good     Standing balance support: During functional activity;No upper extremity supported Standing balance-Leahy Scale: Poor Standing balance comment: fair/poor has to lean on nearby objects for support                             Pertinent Vitals/Pain Pain Assessment: Faces Faces Pain Scale: Hurts little more Pain Location: BLE diabetic pain per patient Pain Descriptors / Indicators: Aching Pain Intervention(s): Limited activity within patient's tolerance;Monitored during session;Premedicated before session    Home Living Family/patient expects to be discharged to:: Private residence Living Arrangements: Other relatives Available Help at Discharge: Family;Available PRN/intermittently Type of Home: House Home Access: Stairs to enter Entrance Stairs-Rails: None Entrance Stairs-Number of Steps: 6 Home Layout: One level Home Equipment: Jonas - 2 wheels;Cane - single point      Prior Function Level of Independence: Independent with assistive device(s)         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[employer], drives     Hand Dominance        Extremity/Trunk Assessment   Upper Extremity Assessment Upper Extremity Assessment: Generalized weakness    Lower Extremity Assessment Lower Extremity Assessment: Generalized weakness    Cervical / Trunk Assessment Cervical / Trunk Assessment: Normal  Communication   Communication: No difficulties  Cognition Arousal/Alertness: Awake/alert Behavior During Therapy: Agitated;Impulsive Overall Cognitive Status:  Within Functional Limits for tasks assessed                                 General Comments: very impulsive with poor safety awareness      General Comments      Exercises     Assessment/Plan    PT Assessment Patient needs continued PT services  PT Problem List Decreased strength;Decreased activity tolerance;Decreased balance;Decreased mobility       PT Treatment Interventions Functional mobility training;Patient/family education;Gait training;Therapeutic activities;Therapeutic exercise;DME instruction;Stair training    PT Goals (Current goals can be found in the Care Plan section)  Acute Rehab PT Goals Patient Stated Goal: return home after rehab PT Goal Formulation: With patient Time For Goal Achievement: 10/27/18 Potential to Achieve Goals: Good    Frequency Min 3X/week   Barriers to discharge        Co-evaluation               AM-PAC PT "6 Clicks" Mobility  Outcome Measure Help needed turning from your back to your side while in a flat bed without using bedrails?: None Help needed moving from lying on your back to sitting on the side of a flat bed without using bedrails?: A Little Help needed moving to and from a bed to a chair (including a wheelchair)?: A Little Help needed standing up from a chair using your arms (e.g., wheelchair or bedside chair)?: A Little Help needed to walk in hospital room?: A Lot Help needed climbing 3-5 steps with a railing? : A Lot 6 Click Score: 17    End of Session   Activity Tolerance: Patient tolerated treatment well;Patient limited by fatigue Patient left: in bed;with call bell/phone within reach;with bed alarm set Nurse Communication: Mobility status PT Visit Diagnosis: Unsteadiness on feet (R26.81);Muscle weakness (generalized) (M62.81);Pain;Other abnormalities of gait and mobility (R26.89)    Time: 7341-9379 PT Time Calculation (min) (ACUTE ONLY): 35 min   Charges:   PT Evaluation $PT Eval Moderate  Complexity: 1 Mod PT Treatments $Therapeutic Activity: 23-37 mins        9:56 AM, 10/20/18 [name], MPT Physical Therapist with [facility] [phone] office [phone] mobile phone

## 2018-10-20 NOTE — Progress Notes (Addendum)
PROGRESS NOTE    Omar Wood  YTW:[id number] DOB: 1961/04/11 DOA: 10/17/2018 PCP: Gennette Pac, NP    Brief Narrative:  58 year old male with a history of insulin-dependent diabetes, bipolar disorder, hypertension, admitted to the hospital with persistent nausea and vomiting.  Found to be in diabetic ketoacidosis and admitted for IV fluids and IV insulin.   Assessment & Plan:   Principal Problem:   DKA, type 1 (HCC) Active Problems:   Bipolar disorder (HCC)   Hypertension   Hypercholesteremia   1. Diabetic ketoacidosis.  Patient reports that he has been compliant with his insulin at home, but he has a history of noncompliance.  He presented with hyperglycemia, dehydration and was found to be in DKA.  He was treated with intravenous insulin and IV fluids.  Overall anion gap is closed.  He has been transitioned off of intravenous insulin and is currently on Lantus.  Diet has been advanced, p.o. intake has been poor.  He reports having nausea, but not has not been having vomiting. Continue current treatments and monitor blood sugars.  He does not have any obvious source of infection. 2. Nausea and vomiting.  Continue antiemetics.  Continue on Reglan 3. Peripheral neuropathy secondary to diabetes.  Start on gabapentin 4. Bipolar disorder.  Continue Seroquel. 5. Hypertension.  Continue on Norvasc. 6. Hyperlipidemia.  Continue statin 7. Candidal esophagitis.  He was diagnosed with this on his previous admission.  Currently on Diflucan.   DVT prophylaxis: lovenox Code Status: DNR Family Communication: no family present Disposition Plan: Seen by physical therapy with recommendations for skilled nursing facility placement.  Per social work, can likely discharge tomorrow   Consultants:     Procedures:     Antimicrobials:       Subjective: Describes paresthesias in feet bilaterally.  Causes pain when walking.  He did eat his dinner last night, but reports that appetite  remains poor  Objective: Vitals:   10/19/18 2215 10/20/18 0032 10/20/18 0435 10/20/18 1449  BP: 125/65 95/65 95/61  101/70  Pulse: 99 97 (!) 104 (!) 102  Resp: 16 16 16 18   Temp: 98.4 F (36.9 C) 98.5 F (36.9 C) 98.3 F (36.8 C) 98.5 F (36.9 C)  TempSrc: Oral Oral Oral Oral  SpO2: 99% 99% 99% 100%    Intake/Output Summary (Last 24 hours) at 10/20/2018 1737 Last data filed at 10/20/2018 1200 Gross per 24 hour  Intake 2206.23 ml  Output 850 ml  Net 1356.23 ml   There were no vitals filed for this visit.  Examination:  General exam: Alert, awake, oriented x 3 Respiratory system: Clear to auscultation. Respiratory effort normal. Cardiovascular system:RRR. No murmurs, rubs, gallops. Gastrointestinal system: Abdomen is nondistended, soft and nontender. No organomegaly or masses felt. Normal bowel sounds heard. Central nervous system: Alert and oriented. No focal neurological deficits. Extremities: No C/C/E, +pedal pulses Skin: No rashes, lesions or ulcers Psychiatry: Judgement and insight appear normal. Mood & affect appropriate.      Data Reviewed: I have personally reviewed following labs and imaging studies  CBC: Recent Labs  Lab 10/17/18 1403 10/19/18 0459  WBC 9.9 7.6  NEUTROABS 7.1  --   HGB 12.6* 10.7*  HCT 39.1 31.9*  MCV 91.1 87.6  PLT 476* 426*   Basic Metabolic Panel: Recent Labs  Lab 10/18/18 0136 10/18/18 0633 10/18/18 1127 10/19/18 0459 10/20/18 0417  NA 131* 130* 131* 136 137  K 3.8 4.2 3.9 3.2* 3.8  CL 99 95* 96* 102 107  CO2 22 20* 23 24 23   GLUCOSE 122* 272* 155* 160* 107*  BUN 16 15 14 16 15   CREATININE 0.80 0.76 0.74 0.69 0.56*  CALCIUM 8.7* 8.7* 8.9 8.9 8.5*  MG  --   --   --   --  1.8   GFR: CrCl cannot be calculated (Unknown ideal weight.). Liver Function Tests: No results for input(s): AST, ALT, ALKPHOS, BILITOT, PROT, ALBUMIN in the last 168 hours. No results for input(s): LIPASE, AMYLASE in the last 168 hours. No results  for input(s): AMMONIA in the last 168 hours. Coagulation Profile: No results for input(s): INR, PROTIME in the last 168 hours. Cardiac Enzymes: No results for input(s): CKTOTAL, CKMB, CKMBINDEX, TROPONINI in the last 168 hours. BNP (last 3 results) No results for input(s): PROBNP in the last 8760 hours. HbA1C: No results for input(s): HGBA1C in the last 72 hours. CBG: Recent Labs  Lab 10/20/18 0025 10/20/18 0420 10/20/18 0726 10/20/18 1130 10/20/18 1634  GLUCAP 181* 102* 123* 92 97   Lipid Profile: No results for input(s): CHOL, HDL, LDLCALC, TRIG, CHOLHDL, LDLDIRECT in the last 72 hours. Thyroid Function Tests: No results for input(s): TSH, T4TOTAL, FREET4, T3FREE, THYROIDAB in the last 72 hours. Anemia Panel: No results for input(s): VITAMINB12, FOLATE, FERRITIN, TIBC, IRON, RETICCTPCT in the last 72 hours. Sepsis Labs: No results for input(s): PROCALCITON, LATICACIDVEN in the last 168 hours.  Recent Results (from the past 240 hour(s))  MRSA PCR Screening     Status: None   Collection Time: 10/17/18  7:05 PM  Result Value Ref Range Status   MRSA by PCR NEGATIVE NEGATIVE Final    Comment:        The GeneXpert MRSA Assay (FDA approved for NASAL specimens only), is one component of a comprehensive MRSA colonization surveillance program. It is not intended to diagnose MRSA infection nor to guide or monitor treatment for MRSA infections. Performed at [facility], [address], [place], [place] [address]          Radiology Studies: No results found.      Scheduled Meds: . amLODipine  5 mg Oral Daily  . atorvastatin  20 mg Oral q1800  . DULoxetine  60 mg Oral BID  . enoxaparin (LOVENOX) injection  40 mg Subcutaneous Q24H  . fluconazole  200 mg Oral Daily  . gabapentin  100 mg Oral BID  . insulin aspart  0-15 Units Subcutaneous Q4H  . insulin aspart  5 Units Subcutaneous TID WC  . insulin glargine  40 Units Subcutaneous Daily  . Melatonin  3 mg Oral QHS   . metoCLOPramide (REGLAN) injection  5 mg Intravenous Q6H  . pantoprazole  40 mg Oral Daily  . propranolol  10 mg Oral TID  . QUEtiapine  400 mg Oral QHS  . traZODone  50 mg Oral QHS   Continuous Infusions: . sodium chloride 125 mL/hr at 10/20/18 0056     LOS: 3 days    Time spent:    Erick Blinks, MD [facility]   If 7PM-7AM, please contact night-coverage [email or web address]  10/20/2018, 5:37 PM

## 2018-10-20 NOTE — Plan of Care (Signed)
Patient is progressing. However, he often refuses education and PT. He tends to be agitated at times and unreceptive.

## 2018-10-20 NOTE — Progress Notes (Signed)
Patient removed bilateral IVs from both arms around 2100, refuses to let RN replace IV, patient says "I just don't wanna mess with all that tonight" Patient was receiving NS at a rate of 178ml/hr and has IV Reglan scheduled at 0000 and 0600. MD paged.

## 2018-10-20 NOTE — Clinical Social Work Note (Signed)
Patient is agreeable to SNF. He states that he cannot ambulate currently and has not been able to ambulate for the past three weeks. Patient has no DME in the home and lives alone. At baseline, he reports that he is independent and that he uses his moped for transportation. Patient reports that he is disabled and receives a monthly check of almost $800.00.  Referrals sent to facilities that accept patients with Medicaid only as payor source. Patient was provided CMS list.    Harland Dingwall, Juleen China, LCSW

## 2018-10-21 LAB — GLUCOSE, CAPILLARY
Glucose-Capillary: 117 mg/dL — ABNORMAL HIGH (ref 70–99)
Glucose-Capillary: 172 mg/dL — ABNORMAL HIGH (ref 70–99)
Glucose-Capillary: 242 mg/dL — ABNORMAL HIGH (ref 70–99)
Glucose-Capillary: 248 mg/dL — ABNORMAL HIGH (ref 70–99)

## 2018-10-21 MED ORDER — FLUCONAZOLE 200 MG PO TABS: 200.0000 mg | ORAL_TABLET | Freq: Every day | ORAL | 0 refills | Status: AC

## 2018-10-21 MED ORDER — GABAPENTIN 100 MG PO CAPS: 100.0000 mg | ORAL_CAPSULE | Freq: Three times a day (TID) | ORAL | Status: AC

## 2018-10-21 NOTE — Discharge Summary (Signed)
Physician Discharge Summary  Omar Wood ZOX:[id number] DOB: Aug 14, 1960 DOA: 10/17/2018  PCP: Gennette Pac, NP  Admit date: 10/17/2018 Discharge date: 10/21/2018  Admitted From:  HOME  Disposition: HOME   Recommendations for Outpatient Follow-up:  1. Please obtain BMP in one week to follow up electrolytes.    Please monitor blood glucose 3-4 times per day and as needed when not feeling well.  Please adjust insulin doses as needed for better glycemic control.  Please monitor closely for signs of low blood glucose.  Fluconazole to be stopped after 10 days.    Discharge Condition: STABLE   CODE STATUS: DNR    Brief Hospitalization Summary: Please see all hospital notes, images, labs for full details of the hospitalization. Dr. Denyce Robert HPI:  58 y.o. male with a history of poorly controlled diabetes, bipolar, hypertension, tobacco use, hyperlipidemia.  Patient seen for elevated blood sugars over the past several days and combination with nausea, vomiting.  No palliating or provoking factors.  Here, the patient received antiemetics and was started on insulin and for fluids due to DKA.  1. Diabetic ketoacidosis. RESOLVED.  Patient reports that he has been compliant with his insulin at home, but he has a history of noncompliance.  He presented with hyperglycemia, dehydration and was found to be in DKA.  He was treated with intravenous insulin and IV fluids.  Overall anion gap is closed.  He has been transitioned off of intravenous insulin and is currently on Lantus and rapid acing insulin 5 units TID with meals IF he eats 50% or more of meals.  Diet has been advanced, p.o. intake has been poor.  He reports having nausea, but not has not been having vomiting.  He does not have any obvious source of infection. 2. Nausea and vomiting.  Much improved.  He is eating and drinking now.  Continue antiemetics as needed.  Continue scheduled metoclopramide with meals.   3. Peripheral neuropathy  secondary to diabetes.   He has been started on gabapentin and has been tolerating it.  The dose can be adjusted outpatient with his providers as needed.   4. Bipolar disorder.  Continue Seroquel. 5. Hypertension.  Continue on Norvasc. 6. Hyperlipidemia.  Continue statin.    7. Candidal esophagitis.  He was diagnosed with this on his previous admission.  He is completing a course of fluconazole.  DC fluconazole after 10 more days of treatment.    DVT prophylaxis: lovenox Code Status: DNR Family Communication: no family present Disposition Plan: SNF placement   Discharge Diagnoses:  Principal Problem:   DKA, type 1 (HCC) Active Problems:   Bipolar disorder (HCC)   Hypertension   Hypercholesteremia   Discharge Instructions: Discharge Instructions    Diet - low sodium heart healthy   Complete by:  As directed    Increase activity slowly   Complete by:  As directed    Increase activity slowly   Complete by:  As directed      Allergies as of 10/21/2018   No Known Allergies     Medication List    TAKE these medications   alum & mag hydroxide-simeth 200-200-20 MG/5ML suspension Commonly known as:  MAALOX/MYLANTA Take 30 mLs by mouth every 6 (six) hours as needed for indigestion or heartburn.   amLODipine 5 MG tablet Commonly known as:  NORVASC Take 1 tablet (5 mg total) by mouth daily.   atorvastatin 20 MG tablet Commonly known as:  LIPITOR Take 20 mg by mouth daily.  Basaglar KwikPen 100 UNIT/ML Sopn Inject 0.4 mLs (40 Units total) into the skin at bedtime.   DULoxetine 60 MG capsule Commonly known as:  CYMBALTA Take 60 mg by mouth 2 (two) times daily.   esomeprazole 40 MG capsule Commonly known as:  NEXIUM Take 1 capsule (40 mg total) by mouth 2 (two) times daily before a meal.   fluconazole 200 MG tablet Commonly known as:  DIFLUCAN Take 1 tablet (200 mg total) by mouth daily for 10 days. Start taking on:  October 22, 2018   gabapentin 100 MG  capsule Commonly known as:  NEURONTIN Take 1 capsule (100 mg total) by mouth 3 (three) times daily.   insulin lispro 100 UNIT/ML injection Commonly known as:  HUMALOG Inject 0.05 mLs (5 Units total) into the skin 3 (three) times daily before meals.   Melatonin 3 MG Tabs Take 3 mg by mouth at bedtime.   metoCLOPramide 5 MG tablet Commonly known as:  Reglan Take 1 tablet (5 mg total) by mouth 3 (three) times daily.   nystatin cream Commonly known as:  MYCOSTATIN Apply topically 2 (two) times daily.   ondansetron 4 MG disintegrating tablet Commonly known as:  ZOFRAN-ODT Take 1 tablet (4 mg total) by mouth every 6 (six) hours as needed for nausea or vomiting.   propranolol 10 MG tablet Commonly known as:  INDERAL Take 10 mg by mouth 3 (three) times daily.   QUEtiapine 400 MG tablet Commonly known as:  SEROQUEL Take 400 mg by mouth at bedtime.   traZODone 50 MG tablet Commonly known as:  DESYREL Take 50 mg by mouth at bedtime.      Contact information for after-discharge care    Destination    [facility] .   Service:  Skilled Nursing Contact information: [address] [place] [address] [phone]             No Known Allergies Allergies as of 10/21/2018   No Known Allergies     Medication List    TAKE these medications   alum & mag hydroxide-simeth 200-200-20 MG/5ML suspension Commonly known as:  MAALOX/MYLANTA Take 30 mLs by mouth every 6 (six) hours as needed for indigestion or heartburn.   amLODipine 5 MG tablet Commonly known as:  NORVASC Take 1 tablet (5 mg total) by mouth daily.   atorvastatin 20 MG tablet Commonly known as:  LIPITOR Take 20 mg by mouth daily.   Basaglar KwikPen 100 UNIT/ML Sopn Inject 0.4 mLs (40 Units total) into the skin at bedtime.   DULoxetine 60 MG capsule Commonly known as:  CYMBALTA Take 60 mg by mouth 2 (two) times daily.   esomeprazole 40 MG capsule Commonly known as:   NEXIUM Take 1 capsule (40 mg total) by mouth 2 (two) times daily before a meal.   fluconazole 200 MG tablet Commonly known as:  DIFLUCAN Take 1 tablet (200 mg total) by mouth daily for 10 days. Start taking on:  October 22, 2018   gabapentin 100 MG capsule Commonly known as:  NEURONTIN Take 1 capsule (100 mg total) by mouth 3 (three) times daily.   insulin lispro 100 UNIT/ML injection Commonly known as:  HUMALOG Inject 0.05 mLs (5 Units total) into the skin 3 (three) times daily before meals.   Melatonin 3 MG Tabs Take 3 mg by mouth at bedtime.   metoCLOPramide 5 MG tablet Commonly known as:  Reglan Take 1 tablet (5 mg total) by mouth 3 (three) times  daily.   nystatin cream Commonly known as:  MYCOSTATIN Apply topically 2 (two) times daily.   ondansetron 4 MG disintegrating tablet Commonly known as:  ZOFRAN-ODT Take 1 tablet (4 mg total) by mouth every 6 (six) hours as needed for nausea or vomiting.   propranolol 10 MG tablet Commonly known as:  INDERAL Take 10 mg by mouth 3 (three) times daily.   QUEtiapine 400 MG tablet Commonly known as:  SEROQUEL Take 400 mg by mouth at bedtime.   traZODone 50 MG tablet Commonly known as:  DESYREL Take 50 mg by mouth at bedtime.       Procedures/Studies: Dg Chest 2 View  Result Date: 09/28/2018 CLINICAL DATA:  Hypoxia EXAM: CHEST - 2 VIEW COMPARISON:  09/24/2018 chest radiograph. FINDINGS: Stable cardiomediastinal silhouette with normal heart size. No pneumothorax. Small bilateral pleural effusions. Extensive patchy opacity throughout both lungs is mildly improved bilaterally. IMPRESSION: 1. Mildly improved extensive patchy opacity throughout both lungs, suggesting improving multifocal pneumonia and/or pulmonary edema. 2. Small bilateral pleural effusions. Electronically Signed   By: Delbert Phenix M.D.   On: 09/28/2018 16:27   Dg Ribs Unilateral Right  Result Date: 09/29/2018 CLINICAL DATA:  Right rib pain. EXAM: RIGHT RIBS - 2 VIEW  COMPARISON:  Radiographs of September 28, 2018. FINDINGS: No fracture or other bone lesions are seen involving the ribs. IMPRESSION: Negative. Electronically Signed   By: Lupita Raider, M.D.   On: 09/29/2018 17:36   Ct Chest Wo Contrast  Result Date: 09/30/2018 CLINICAL DATA:  Chronic dyspnea. EXAM: CT CHEST WITHOUT CONTRAST TECHNIQUE: Multidetector CT imaging of the chest was performed following the standard protocol without IV contrast. COMPARISON:  02/18/2018 FINDINGS: Cardiovascular: Evaluation of vascular structures is limited without IV contrast material. Heart size is normal. No pericardial effusions. Normal caliber thoracic aorta. Mediastinum/Nodes: Scattered mediastinal lymph nodes are not pathologically enlarged, likely reactive. Esophagus is decompressed. Suggestion of wall thickening in the distal esophagus as seen on prior study. This could be due to reflux disease or a mass lesion, as suggested previously. No evidence of proximal obstruction. Lungs/Pleura: Patchy airspace and interstitial infiltrates demonstrated throughout both lungs, new since prior CT. Small bilateral pleural effusions. Changes likely to represent multifocal pneumonia. No pneumothorax. Airways are patent. Upper Abdomen: No acute abnormalities. Musculoskeletal: No chest wall mass or suspicious bone lesions identified. Old right rib fractures. IMPRESSION: 1. Patchy airspace and interstitial infiltrates throughout both lungs, new since prior study. Small bilateral pleural effusions. Changes likely to represent multifocal pneumonia. 2. Suggestion of wall thickening in the distal esophagus. This could be due to reflux disease or mass lesion. Suggest correlation with endoscopy, as was also suggested in the previous study. Electronically Signed   By: Burman Nieves M.D.   On: 09/30/2018 22:05   Dg Chest Port 1 View  Result Date: 09/30/2018 CLINICAL DATA:  58 year old male with recent hypoxia and multifocal pneumonia versus edema.  EXAM: PORTABLE CHEST 1 VIEW COMPARISON:  Chest radiographs 09/28/2018 and earlier. FINDINGS: Portable AP semi upright view at 1541 hours. Patchy and streaky bilateral pulmonary opacity is stable since 09/28/2018 and more extensive in the left lung. No pneumothorax. No pleural effusion is evident. No areas of worsening ventilation. Stable cardiac size and mediastinal contours. Visualized tracheal air column is within normal limits. Chronic left clavicle fracture. No acute osseous abnormality identified. Negative visible bowel gas pattern IMPRESSION: Stable since 09/28/2018. Patchy and streaky bilateral pulmonary opacity. No new cardiopulmonary abnormality. Electronically Signed   By: Odessa Fleming  M.D.   On: 09/30/2018 15:59   Dg Chest Port 1 View  Result Date: 09/24/2018 CLINICAL DATA:  Community-acquired pneumonia EXAM: PORTABLE CHEST 1 VIEW COMPARISON:  09/23/2018 FINDINGS: Cardiac shadow is stable. Left jugular central line is again noted in satisfactory position. The endotracheal tube and nasogastric catheter have been removed in the interval. Increasing patchy infiltrates are noted bilaterally as well as central vascular congestion and edema. IMPRESSION: Increasing patchy infiltrates bilaterally. Central vascular congestion and interstitial edema. Electronically Signed   By: Alcide Clever M.D.   On: 09/24/2018 07:16   Dg Chest Port 1 View  Result Date: 09/23/2018 CLINICAL DATA:  58 year old male with hepatitis, hyperglycemia. Respiratory failure. EXAM: PORTABLE CHEST 1 VIEW COMPARISON:  09/22/2018 and earlier. FINDINGS: Portable AP semi upright view at 0543 hours. Endotracheal tube tip is stable at the level the clavicles. Enteric tube is stable with side hole at the level of the gastric cardia. Stable left IJ central line. Stable lung volumes and mediastinal contours. Coarse and confluent scattered bilateral pulmonary opacity persists. This is most pronounced in both lower lungs, and some of the left  mediastinal and diaphragm contour are obscured as before. Right upper lobe ventilation may be mildly improved since yesterday. No superimposed pneumothorax or large pleural effusion. Paucity bowel gas in the upper abdomen. IMPRESSION: 1. Stable lines and tubes. 2. Continued coarse and confluent bilateral pulmonary opacity. Mild if any improvement in right upper lung ventilation since yesterday. Electronically Signed   By: Odessa Fleming M.D.   On: 09/23/2018 06:56   Dg Chest Port 1 View  Result Date: 09/22/2018 CLINICAL DATA:  Respiratory failure with hypoxia EXAM: PORTABLE CHEST 1 VIEW COMPARISON:  09/19/2018, 09/17/2018, 09/05/2018 FINDINGS: Endotracheal tube tip is about 1.5 cm superior to the carina. Esophageal tube tip overlies the proximal stomach. Left-sided central venous catheter tip over the mid right atrium. Worsening consolidation at the left base. Worsening bilateral ground-glass opacity. Probable small left effusion. Stable cardiomediastinal silhouette. Old left clavicle fracture IMPRESSION: 1. Endotracheal tube tip about 1.5 cm superior to carina. Left IJ central venous catheter tip projects over the mid right atrium 2. Interval worsening of bilateral ground-glass opacity with worsened consolidation at the left lung base. Electronically Signed   By: Jasmine Pang M.D.   On: 09/22/2018 03:58   US Abdomen Limited Ruq  Result Date: 09/23/2018 CLINICAL DATA:  58 year old male with hepatitis, hyperglycemia. EXAM: ULTRASOUND ABDOMEN LIMITED RIGHT UPPER QUADRANT COMPARISON:  [facility] CT Abdomen and Pelvis 07/06/2018. FINDINGS: Gallbladder: No gallstones or wall thickening visualized. No sonographic Murphy sign noted by sonographer. Common bile duct: Diameter: 6 millimeters, upper limits of normal. Liver: Liver echogenicity appears within normal limits (image 20). No discrete liver lesion. No intrahepatic biliary ductal dilatation. Portal vein is patent on color Doppler imaging with normal  direction of blood flow towards the liver. Other findings: Negative visible right kidney. Small right pleural effusion is visible on image 37. No ascites identified. IMPRESSION: 1. Ultrasound appearance of the liver and right upper quadrant within normal limits. 2. Small right pleural effusion. Electronically Signed   By: Odessa Fleming M.D.   On: 09/23/2018 06:32      Subjective: Pt says he is feeling better.  He is in no distress.  He is eating and drinking well.  He has no complaints.  He is agreeable to go to SNF for rehab therapy.    Discharge Exam: Vitals:   10/20/18 2154 10/21/18 0615  BP: (!) 146/90 120/72  Pulse: (!) 101 99  Resp: 16 16  Temp: 98.2 F (36.8 C) 98.8 F (37.1 C)  SpO2: 98% 98%   Vitals:   10/20/18 1925 10/20/18 2142 10/20/18 2154 10/21/18 0615  BP:   (!) 146/90 120/72  Pulse:   (!) 101 99  Resp:   16 16  Temp:   98.2 F (36.8 C) 98.8 F (37.1 C)  TempSrc:   Oral Oral  SpO2: 98%  98% 98%  Weight:  70 kg    Height:   (1.803 m)     General: emaciated elderly male, alert, awake, not in acute distress Cardiovascular:  normal S1/S2 +, no rubs, no gallops Respiratory: CTA bilaterally, no wheezing, no rhonchi Abdominal: Soft, NT, ND, bowel sounds + Extremities: no edema, no cyanosis   The results of significant diagnostics from this hospitalization (including imaging, microbiology, ancillary and laboratory) are listed below for reference.     Microbiology: Recent Results (from the past 240 hour(s))  MRSA PCR Screening     Status: None   Collection Time: 10/17/18  7:05 PM  Result Value Ref Range Status   MRSA by PCR NEGATIVE NEGATIVE Final    Comment:        The GeneXpert MRSA Assay (FDA approved for NASAL specimens only), is one component of a comprehensive MRSA colonization surveillance program. It is not intended to diagnose MRSA infection nor to guide or monitor treatment for MRSA infections. Performed at [facility], [address],  [place], [place] [address]      Labs: BNP (last 3 results) No results for input(s): BNP in the last 8760 hours. Basic Metabolic Panel: Recent Labs  Lab 10/18/18 0136 10/18/18 0633 10/18/18 1127 10/19/18 0459 10/20/18 0417  NA 131* 130* 131* 136 137  K 3.8 4.2 3.9 3.2* 3.8  CL 99 95* 96* 102 107  CO2 22 20* GLUCOSE 122* 272* 155* 160* 107*  BUN CREATININE 0.80 0.76 0.74 0.69 0.56*  CALCIUM 8.7* 8.7* 8.9 8.9 8.5*  MG  --   --   --   --  1.8   Liver Function Tests: No results for input(s): AST, ALT, ALKPHOS, BILITOT, PROT, ALBUMIN in the last 168 hours. No results for input(s): LIPASE, AMYLASE in the last 168 hours. No results for input(s): AMMONIA in the last 168 hours. CBC: Recent Labs  Lab 10/17/18 1403 10/19/18 0459  WBC 9.9 7.6  NEUTROABS 7.1  --   HGB 12.6* 10.7*  HCT 39.1 31.9*  MCV 91.1 87.6  PLT 476* 426*   Cardiac Enzymes: No results for input(s): CKTOTAL, CKMB, CKMBINDEX, TROPONINI in the last 168 hours. BNP: Invalid input(s): POCBNP CBG: Recent Labs  Lab 10/20/18 1634 10/20/18 2003 10/21/18 0012 10/21/18 0406 10/21/18 0731  GLUCAP 97 104* 248* 117* 242*   D-Dimer No results for input(s): DDIMER in the last 72 hours. Hgb A1c No results for input(s): HGBA1C in the last 72 hours. Lipid Profile No results for input(s): CHOL, HDL, LDLCALC, TRIG, CHOLHDL, LDLDIRECT in the last 72 hours. Thyroid function studies No results for input(s): TSH, T4TOTAL, T3FREE, THYROIDAB in the last 72 hours.  Invalid input(s): FREET3 Anemia work up No results for input(s): VITAMINB12, FOLATE, FERRITIN, TIBC, IRON, RETICCTPCT in the last 72 hours. Urinalysis    Component Value Date/Time   COLORURINE YELLOW 10/17/2018 1440   APPEARANCEUR CLOUDY (A) 10/17/2018 1440   LABSPEC 1.017 10/17/2018 1440   PHURINE 5.0 10/17/2018 1440  GLUCOSEU >=500 (A) 10/17/2018 1440   HGBUR MODERATE (A) 10/17/2018 1440   BILIRUBINUR NEGATIVE 10/17/2018 1440    KETONESUR 80 (A) 10/17/2018 1440   PROTEINUR NEGATIVE 10/17/2018 1440   NITRITE NEGATIVE 10/17/2018 1440   LEUKOCYTESUR MODERATE (A) 10/17/2018 1440   Sepsis Labs Invalid input(s): PROCALCITONIN,  WBC,  LACTICIDVEN Microbiology Recent Results (from the past 240 hour(s))  MRSA PCR Screening     Status: None   Collection Time: 10/17/18  7:05 PM  Result Value Ref Range Status   MRSA by PCR NEGATIVE NEGATIVE Final    Comment:        The GeneXpert MRSA Assay (FDA approved for NASAL specimens only), is one component of a comprehensive MRSA colonization surveillance program. It is not intended to diagnose MRSA infection nor to guide or monitor treatment for MRSA infections. Performed at [facility], [address] [place], [place] [address]    Time coordinating discharge:  35 minutes   SIGNED:  Standley Dakins, MD  [facility] 10/21/2018, 8:53 AM How to contact the [facility] Attending or Consulting provider 7A - 7P or covering provider during after hours 7P -7A, for this patient?  1. Check the care team in [facility] and look for a) attending/consulting [facility] provider listed and b) the [facility] team listed 2. Log into [email or web address] and use [facility] universal password to access. If you do not have the password, please contact the hospital operator. 3. Locate the [facility] provider you are looking for under [facility] and page to a number that you can be directly reached. 4. If you still have difficulty reaching the provider, please page the [facility] (Director on Call) for the Hospitalists listed on amion for assistance.

## 2018-10-21 NOTE — Discharge Instructions (Signed)
Diabetes Mellitus and Nutrition, Adult When you have diabetes (diabetes mellitus), it is very important to have healthy eating habits because your blood sugar (glucose) levels are greatly affected by what you eat and drink. Eating healthy foods in the appropriate amounts, at about the same times every day, can help you: Control your blood glucose. Lower your risk of heart disease. Improve your blood pressure. Reach or maintain a healthy weight. Every person with diabetes is different, and each person has different needs for a meal plan. Your health care provider may recommend that you work with a diet and nutrition specialist (dietitian) to make a meal plan that is best for you. Your meal plan may vary depending on factors such as: The calories you need. The medicines you take. Your weight. Your blood glucose, blood pressure, and cholesterol levels. Your activity level. Other health conditions you have, such as heart or kidney disease. How do carbohydrates affect me? Carbohydrates, also called carbs, affect your blood glucose level more than any other type of food. Eating carbs naturally raises the amount of glucose in your blood. Carb counting is a method for keeping track of how many carbs you eat. Counting carbs is important to keep your blood glucose at a healthy level, especially if you use insulin or take certain oral diabetes medicines. It is important to know how many carbs you can safely have in each meal. This is different for every person. Your dietitian can help you calculate how many carbs you should have at each meal and for each snack. Foods that contain carbs include: Bread, cereal, rice, pasta, and crackers. Potatoes and corn. Peas, beans, and lentils. Milk and yogurt. Fruit and juice. Desserts, such as cakes, cookies, ice cream, and candy. How does alcohol affect me? Alcohol can cause a sudden decrease in blood glucose (hypoglycemia), especially if you use insulin or  take certain oral diabetes medicines. Hypoglycemia can be a life-threatening condition. Symptoms of hypoglycemia (sleepiness, dizziness, and confusion) are similar to symptoms of having too much alcohol. If your health care provider says that alcohol is safe for you, follow these guidelines: Limit alcohol intake to no more than 1 drink per day for nonpregnant women and 2 drinks per day for men. One drink equals 12 oz of beer, 5 oz of wine, or 1 oz of hard liquor. Do not drink on an empty stomach. Keep yourself hydrated with water, diet soda, or unsweetened iced tea. Keep in mind that regular soda, juice, and other mixers may contain a lot of sugar and must be counted as carbs. What are tips for following this plan?  Reading food labels Start by checking the serving size on the "Nutrition Facts" label of packaged foods and drinks. The amount of calories, carbs, fats, and other nutrients listed on the label is based on one serving of the item. Many items contain more than one serving per package. Check the total grams (g) of carbs in one serving. You can calculate the number of servings of carbs in one serving by dividing the total carbs by 15. For example, if a food has 30 g of total carbs, it would be equal to 2 servings of carbs. Check the number of grams (g) of saturated and trans fats in one serving. Choose foods that have low or no amount of these fats. Check the number of milligrams (mg) of salt (sodium) in one serving. Most people should limit total sodium intake to less than 2,300 mg per  day. Always check the nutrition information of foods labeled as "low-fat" or "nonfat". These foods may be higher in added sugar or refined carbs and should be avoided. Talk to your dietitian to identify your daily goals for nutrients listed on the label. Shopping Avoid buying canned, premade, or processed foods. These foods tend to be high in fat, sodium, and added sugar. Shop around the outside edge of the  grocery store. This includes fresh fruits and vegetables, bulk grains, fresh meats, and fresh dairy. Cooking Use low-heat cooking methods, such as baking, instead of high-heat cooking methods like deep frying. Cook using healthy oils, such as olive, canola, or sunflower oil. Avoid cooking with butter, cream, or high-fat meats. Meal planning Eat meals and snacks regularly, preferably at the same times every day. Avoid going long periods of time without eating. Eat foods high in fiber, such as fresh fruits, vegetables, beans, and whole grains. Talk to your dietitian about how many servings of carbs you can eat at each meal. Eat 4-6 ounces (oz) of lean protein each day, such as lean meat, chicken, fish, eggs, or tofu. One oz of lean protein is equal to: 1 oz of meat, chicken, or fish. 1 egg.  cup of tofu. Eat some foods each day that contain healthy fats, such as avocado, nuts, seeds, and fish. Lifestyle Check your blood glucose regularly. Exercise regularly as told by your health care provider. This may include: 150 minutes of moderate-intensity or vigorous-intensity exercise each week. This could be brisk walking, biking, or water aerobics. Stretching and doing strength exercises, such as yoga or weightlifting, at least 2 times a week. Take medicines as told by your health care provider. Do not use any products that contain nicotine or tobacco, such as cigarettes and e-cigarettes. If you need help quitting, ask your health care provider. Work with a Veterinary surgeon or diabetes educator to identify strategies to manage stress and any emotional and social challenges. Questions to ask a health care provider Do I need to meet with a diabetes educator? Do I need to meet with a dietitian? What number can I call if I have questions? When are the best times to check my blood glucose? Where to find more information: American Diabetes Association: diabetes.org Academy of Nutrition and Dietetics:  www.eatright.[employer] of Diabetes and Digestive and Kidney Diseases (NIH): CarFlippers.tn Summary A healthy meal plan will help you control your blood glucose and maintain a healthy lifestyle. Working with a diet and nutrition specialist (dietitian) can help you make a meal plan that is best for you. Keep in mind that carbohydrates (carbs) and alcohol have immediate effects on your blood glucose levels. It is important to count carbs and to use alcohol carefully. This information is not intended to replace advice given to you by your health care provider. Make sure you discuss any questions you have with your health care provider. Document Released: 04/05/2005 Document Revised: 02/06/2017 Document Reviewed: 08/13/2016 Elsevier Interactive Patient Education  2019 Elsevier Inc.    Please monitor blood glucose 3-4 times per day and as needed when not feeling well.  Please adjust insulin doses as needed for better glycemic control.  Please monitor closely for signs of low blood glucose.  Fluconazole to be stopped after 10 days.

## 2018-10-26 ENCOUNTER — Encounter (HOSPITAL_COMMUNITY): Payer: Self-pay | Admitting: Emergency Medicine

## 2018-10-26 ENCOUNTER — Other Ambulatory Visit: Payer: Self-pay

## 2018-10-26 ENCOUNTER — Inpatient Hospital Stay (HOSPITAL_COMMUNITY)
Admission: EM | Admit: 2018-10-26 | Discharge: 2018-10-28 | DRG: 638 | Disposition: A | Discharge status: Home Health | Payer: Medicaid Other | Source: Skilled Nursing Facility | Attending: Internal Medicine | Admitting: Internal Medicine

## 2018-10-26 ENCOUNTER — Emergency Department (HOSPITAL_COMMUNITY): Payer: Medicaid Other

## 2018-10-26 DIAGNOSIS — I10 Essential (primary) hypertension: Secondary | ICD-10-CM | POA: Diagnosis present

## 2018-10-26 DIAGNOSIS — N179 Acute kidney failure, unspecified: Secondary | ICD-10-CM | POA: Diagnosis present

## 2018-10-26 DIAGNOSIS — Z794 Long term (current) use of insulin: Secondary | ICD-10-CM | POA: Diagnosis not present

## 2018-10-26 DIAGNOSIS — E871 Hypo-osmolality and hyponatremia: Secondary | ICD-10-CM | POA: Diagnosis present

## 2018-10-26 DIAGNOSIS — N136 Pyonephrosis: Secondary | ICD-10-CM | POA: Diagnosis present

## 2018-10-26 DIAGNOSIS — E7800 Pure hypercholesterolemia, unspecified: Secondary | ICD-10-CM | POA: Diagnosis present

## 2018-10-26 DIAGNOSIS — N1330 Unspecified hydronephrosis: Secondary | ICD-10-CM | POA: Diagnosis not present

## 2018-10-26 DIAGNOSIS — N390 Urinary tract infection, site not specified: Secondary | ICD-10-CM | POA: Diagnosis not present

## 2018-10-26 DIAGNOSIS — F419 Anxiety disorder, unspecified: Secondary | ICD-10-CM | POA: Diagnosis present

## 2018-10-26 DIAGNOSIS — Z87891 Personal history of nicotine dependence: Secondary | ICD-10-CM

## 2018-10-26 DIAGNOSIS — E876 Hypokalemia: Secondary | ICD-10-CM | POA: Diagnosis present

## 2018-10-26 DIAGNOSIS — R739 Hyperglycemia, unspecified: Secondary | ICD-10-CM | POA: Diagnosis not present

## 2018-10-26 DIAGNOSIS — F319 Bipolar disorder, unspecified: Secondary | ICD-10-CM | POA: Diagnosis present

## 2018-10-26 DIAGNOSIS — E1310 Other specified diabetes mellitus with ketoacidosis without coma: Secondary | ICD-10-CM

## 2018-10-26 DIAGNOSIS — B3781 Candidal esophagitis: Secondary | ICD-10-CM | POA: Diagnosis present

## 2018-10-26 DIAGNOSIS — E1110 Type 2 diabetes mellitus with ketoacidosis without coma: Principal | ICD-10-CM | POA: Diagnosis present

## 2018-10-26 DIAGNOSIS — Z9114 Patient's other noncompliance with medication regimen: Secondary | ICD-10-CM | POA: Diagnosis not present

## 2018-10-26 DIAGNOSIS — E1010 Type 1 diabetes mellitus with ketoacidosis without coma: Secondary | ICD-10-CM

## 2018-10-26 LAB — URINALYSIS, ROUTINE W REFLEX MICROSCOPIC
Bilirubin Urine: NEGATIVE
Glucose, UA: >=500 mg/dL — AB
Ketones, ur: 80 mg/dL — AB
Nitrite: NEGATIVE
Protein, ur: NEGATIVE mg/dL
Specific Gravity, Urine: 1.017 (ref 1.005–1.030)
WBC, UA: >50 WBC/hpf — ABNORMAL HIGH (ref 0–5)
pH: 5 (ref 5.0–8.0)

## 2018-10-26 LAB — CBC WITH DIFFERENTIAL/PLATELET
Abs Immature Granulocytes: 0.1 10*3/uL — ABNORMAL HIGH (ref 0.00–0.07)
Basophils Absolute: 0.1 10*3/uL (ref 0.0–0.1)
Basophils Relative: 1 %
Eosinophils Absolute: 0 10*3/uL (ref 0.0–0.5)
Eosinophils Relative: 0 %
HCT: 38.4 % — ABNORMAL LOW (ref 39.0–52.0)
Hemoglobin: 12 g/dL — ABNORMAL LOW (ref 13.0–17.0)
Immature Granulocytes: 1 %
Lymphocytes Relative: 16 %
Lymphs Abs: 1.7 10*3/uL (ref 0.7–4.0)
MCH: 29 pg (ref 26.0–34.0)
MCHC: 31.3 g/dL (ref 30.0–36.0)
MCV: 92.8 fL (ref 80.0–100.0)
Monocytes Absolute: 0.5 10*3/uL (ref 0.1–1.0)
Monocytes Relative: 5 %
Neutro Abs: 8.1 10*3/uL — ABNORMAL HIGH (ref 1.7–7.7)
Neutrophils Relative %: 77 %
Platelets: 351 10*3/uL (ref 150–400)
RBC: 4.14 MIL/uL — ABNORMAL LOW (ref 4.22–5.81)
RDW: 13.2 % (ref 11.5–15.5)
WBC: 10.5 10*3/uL (ref 4.0–10.5)
nRBC: 0 % (ref 0.0–0.2)

## 2018-10-26 LAB — BASIC METABOLIC PANEL
Anion gap: 24 — ABNORMAL HIGH (ref 5–15)
BUN: 19 mg/dL (ref 6–20)
CO2: 9 mmol/L — ABNORMAL LOW (ref 22–32)
Calcium: 9.4 mg/dL (ref 8.9–10.3)
Chloride: 97 mmol/L — ABNORMAL LOW (ref 98–111)
Creatinine, Ser: 1.13 mg/dL (ref 0.61–1.24)
GFR calc Af Amer: >60 mL/min (ref >60)
GFR calc non Af Amer: >60 mL/min (ref >60)
Glucose, Bld: 566 mg/dL (ref 70–99)
Potassium: 5.5 mmol/L — ABNORMAL HIGH (ref 3.5–5.1)
Sodium: 130 mmol/L — ABNORMAL LOW (ref 135–145)

## 2018-10-26 LAB — MRSA PCR SCREENING: MRSA by PCR: NEGATIVE

## 2018-10-26 LAB — BLOOD GAS, VENOUS
Acid-base deficit: 18.4 mmol/L — ABNORMAL HIGH (ref 0.0–2.0)
Bicarbonate: 9.3 mmol/L — ABNORMAL LOW (ref 20.0–28.0)
FIO2: 21
O2 Saturation: 28.2 %
Patient temperature: 37
pCO2, Ven: 37.7 mmHg — ABNORMAL LOW (ref 44.0–60.0)
pH, Ven: 7.051 — CL (ref 7.250–7.430)
pO2, Ven: <31 mmHg — CL (ref 32.0–45.0)

## 2018-10-26 LAB — RAPID URINE DRUG SCREEN, HOSP PERFORMED
Amphetamines: NOT DETECTED
Barbiturates: NOT DETECTED
Benzodiazepines: NOT DETECTED
Cocaine: NOT DETECTED
Opiates: NOT DETECTED
Tetrahydrocannabinol: NOT DETECTED

## 2018-10-26 LAB — TROPONIN I: Troponin I: <0.03 ng/mL (ref <0.03)

## 2018-10-26 LAB — BETA-HYDROXYBUTYRIC ACID: Beta-Hydroxybutyric Acid: >8 mmol/L — ABNORMAL HIGH (ref 0.05–0.27)

## 2018-10-26 LAB — GLUCOSE, CAPILLARY
Glucose-Capillary: 432 mg/dL — ABNORMAL HIGH (ref 70–99)
Glucose-Capillary: 366 mg/dL — ABNORMAL HIGH (ref 70–99)
Glucose-Capillary: 231 mg/dL — ABNORMAL HIGH (ref 70–99)
Glucose-Capillary: 170 mg/dL — ABNORMAL HIGH (ref 70–99)
Glucose-Capillary: 167 mg/dL — ABNORMAL HIGH (ref 70–99)

## 2018-10-26 LAB — MAGNESIUM: Magnesium: 1.8 mg/dL (ref 1.7–2.4)

## 2018-10-26 LAB — CBG MONITORING, ED
Glucose-Capillary: 529 mg/dL (ref 70–99)
Glucose-Capillary: 487 mg/dL — ABNORMAL HIGH (ref 70–99)

## 2018-10-26 LAB — ETHANOL: Alcohol, Ethyl (B): <10 mg/dL (ref <10)

## 2018-10-26 MED ORDER — LORAZEPAM 2 MG/ML IJ SOLN
1.0000 mg | Freq: Once | INTRAMUSCULAR | Status: AC
  Administered 2018-10-26: 1 mg via INTRAVENOUS
  Filled 2018-10-26: qty 1

## 2018-10-26 MED ORDER — CHLORHEXIDINE GLUCONATE CLOTH 2 % EX PADS
6.0000 | MEDICATED_PAD | Freq: Every day | CUTANEOUS | Status: DC
  Administered 2018-10-27 – 2018-10-28 (×2): 6 via TOPICAL

## 2018-10-26 MED ORDER — GABAPENTIN 100 MG PO CAPS
100.0000 mg | ORAL_CAPSULE | Freq: Three times a day (TID) | ORAL | Status: DC
  Administered 2018-10-26 – 2018-10-28 (×5): 100 mg via ORAL
  Filled 2018-10-26 (×5): qty 1

## 2018-10-26 MED ORDER — SODIUM CHLORIDE 0.9 % IV SOLN
INTRAVENOUS | Status: DC
  Administered 2018-10-26: 19:00:00 via INTRAVENOUS

## 2018-10-26 MED ORDER — TRAZODONE HCL 50 MG PO TABS
50.0000 mg | ORAL_TABLET | Freq: Every day | ORAL | Status: DC
  Administered 2018-10-26 – 2018-10-27 (×2): 50 mg via ORAL
  Filled 2018-10-26 (×2): qty 1

## 2018-10-26 MED ORDER — MORPHINE SULFATE (PF) 4 MG/ML IV SOLN
4.0000 mg | Freq: Once | INTRAVENOUS | Status: AC
  Administered 2018-10-26: 4 mg via INTRAVENOUS
  Filled 2018-10-26: qty 1

## 2018-10-26 MED ORDER — ACETAMINOPHEN 325 MG PO TABS: 650.0000 mg | ORAL_TABLET | Freq: Four times a day (QID) | ORAL | Status: DC | PRN

## 2018-10-26 MED ORDER — ONDANSETRON HCL 4 MG PO TABS: 4.0000 mg | ORAL_TABLET | Freq: Four times a day (QID) | ORAL | Status: DC | PRN

## 2018-10-26 MED ORDER — DULOXETINE HCL 30 MG PO CPEP
60.0000 mg | ORAL_CAPSULE | Freq: Two times a day (BID) | ORAL | Status: DC
  Administered 2018-10-26 – 2018-10-27 (×3): 60 mg via ORAL
  Filled 2018-10-26 (×4): qty 2

## 2018-10-26 MED ORDER — SODIUM CHLORIDE 0.9 % IV BOLUS
1000.0000 mL | Freq: Once | INTRAVENOUS | Status: AC
  Administered 2018-10-26: 16:00:00 1000 mL via INTRAVENOUS

## 2018-10-26 MED ORDER — PROPRANOLOL HCL 10 MG PO TABS
10.0000 mg | ORAL_TABLET | Freq: Three times a day (TID) | ORAL | Status: DC
  Administered 2018-10-26 – 2018-10-28 (×5): 10 mg via ORAL
  Filled 2018-10-26 (×9): qty 1

## 2018-10-26 MED ORDER — ENOXAPARIN SODIUM 40 MG/0.4ML ~~LOC~~ SOLN
40.0000 mg | SUBCUTANEOUS | Status: DC
  Administered 2018-10-26 – 2018-10-27 (×2): 40 mg via SUBCUTANEOUS
  Filled 2018-10-26 (×2): qty 0.4

## 2018-10-26 MED ORDER — ONDANSETRON HCL 4 MG/2ML IJ SOLN
4.0000 mg | Freq: Once | INTRAMUSCULAR | Status: AC
  Administered 2018-10-26: 4 mg via INTRAVENOUS
  Filled 2018-10-26: qty 2

## 2018-10-26 MED ORDER — AMLODIPINE BESYLATE 5 MG PO TABS
5.0000 mg | ORAL_TABLET | Freq: Every day | ORAL | Status: DC
  Administered 2018-10-26 – 2018-10-28 (×3): 5 mg via ORAL
  Filled 2018-10-26 (×3): qty 1

## 2018-10-26 MED ORDER — INSULIN REGULAR(HUMAN) IN NACL 100-0.9 UT/100ML-% IV SOLN
INTRAVENOUS | Status: DC
  Administered 2018-10-26: 17:00:00 4.7 [IU]/h via INTRAVENOUS
  Filled 2018-10-26: qty 100

## 2018-10-26 MED ORDER — SODIUM CHLORIDE 0.9 % IV SOLN
1.0000 g | INTRAVENOUS | Status: DC
  Administered 2018-10-26: 1 g via INTRAVENOUS
  Filled 2018-10-26: qty 10

## 2018-10-26 MED ORDER — DEXTROSE-NACL 5-0.45 % IV SOLN
INTRAVENOUS | Status: DC
  Administered 2018-10-26 – 2018-10-27 (×2): via INTRAVENOUS

## 2018-10-26 MED ORDER — PANTOPRAZOLE SODIUM 40 MG PO TBEC
40.0000 mg | DELAYED_RELEASE_TABLET | Freq: Every day | ORAL | Status: DC
  Administered 2018-10-26 – 2018-10-28 (×3): 40 mg via ORAL
  Filled 2018-10-26 (×3): qty 1

## 2018-10-26 MED ORDER — SODIUM CHLORIDE 0.9 % IV BOLUS
1000.0000 mL | Freq: Once | INTRAVENOUS | Status: AC
  Administered 2018-10-26: 1000 mL via INTRAVENOUS

## 2018-10-26 MED ORDER — HALOPERIDOL LACTATE 5 MG/ML IJ SOLN
5.0000 mg | Freq: Once | INTRAMUSCULAR | Status: AC
  Administered 2018-10-26: 20:00:00 5 mg via INTRAMUSCULAR
  Filled 2018-10-26: qty 1

## 2018-10-26 MED ORDER — ONDANSETRON HCL 4 MG/2ML IJ SOLN: 4.0000 mg | Freq: Four times a day (QID) | INTRAMUSCULAR | Status: DC | PRN

## 2018-10-26 MED ORDER — FLUCONAZOLE 100 MG PO TABS
200.0000 mg | ORAL_TABLET | Freq: Every day | ORAL | Status: DC
  Administered 2018-10-26 – 2018-10-28 (×3): 200 mg via ORAL
  Filled 2018-10-26 (×3): qty 2

## 2018-10-26 MED ORDER — QUETIAPINE FUMARATE 100 MG PO TABS
300.0000 mg | ORAL_TABLET | Freq: Every day | ORAL | Status: DC
  Administered 2018-10-26 – 2018-10-27 (×2): 300 mg via ORAL
  Filled 2018-10-26 (×2): qty 3

## 2018-10-26 MED ORDER — ACETAMINOPHEN 650 MG RE SUPP: 650.0000 mg | Freq: Four times a day (QID) | RECTAL | Status: DC | PRN

## 2018-10-26 MED ORDER — POLYETHYLENE GLYCOL 3350 17 G PO PACK: 17.0000 g | PACK | Freq: Every day | ORAL | Status: DC | PRN

## 2018-10-26 MED ORDER — ATORVASTATIN CALCIUM 10 MG PO TABS
20.0000 mg | ORAL_TABLET | Freq: Every day | ORAL | Status: DC
  Administered 2018-10-26 – 2018-10-27 (×2): 20 mg via ORAL
  Filled 2018-10-26 (×3): qty 2

## 2018-10-26 NOTE — ED Triage Notes (Addendum)
Per EMS: patient from [facility]. Patient was sent here because he is not compliant at nursing home. Patient refusing to take insulin because he states, "the sliding scale does not work for him." Patient is agitated in triage and combative.  Patient's CBG per EMS was 569.

## 2018-10-26 NOTE — ED Notes (Signed)
Date and time results received: 10/26/18 5:15 PM    Test: vbg Critical Value: ph 7.051   po2  Less than 31   Name of Provider Notified: Dr Particia Nearing   Orders Received? Or Actions Taken?:

## 2018-10-26 NOTE — H&P (Addendum)
History and Physical    Omar Wood WJX:[id number] DOB: [birth year] DOA: 10/26/2018  PCP: Gennette Pac, NP   [facility] from: [facility]  I have personally briefly reviewed patient's old medical records in [facility]  Chief Complaint: High blood sugar  HPI: Omar Wood is a 58 y.o. male with medical history significant for DM, HTN, was brought to Ed from [facility] with reports of high blood sugar, reportedly patient was refusing his medications. He said the sliding scale does not work for him. Patient also agitated in ed and combative.  Patient answers some of my questions- He reports burning with urination, and dry cough but denies SOB, no fever, no chest pain.    I called the [facility]. They report patient has been refusing all his medications, including insulins. He gets comabtive and agitated.  He has not had any complaints. No fevers. No sick patients at the [facility], no patients with Co-vid 19, and no patients with new respiratory symptoms.   Recent Hospitalization- 3/27-3/31- for DKA.   ED Course: tachycardic to 140, glucose- 566, Anion gap- 24, Bicarb 9, elevated cr- 1.13, Na- 130. UA- rare bact, >50 WBC, small leuks. EKG- sinus tach, unchanged from prior. Port Chest- no acute abnormality. Patient started on DKA protocol, Hospitalist to admit for DKA.   Review of Systems: As per HPI all other systems reviewed and negative.  Past Medical History:  Diagnosis Date  . Bipolar disorder (HCC)   . Diabetes mellitus without complication (HCC)   . Hypercholesteremia   . Hypertension   . Marijuana user   . Scrotal injury   . Tobacco abuse     Past Surgical History:  Procedure Laterality Date  . APPENDECTOMY    . BIOPSY  09/30/2018   Procedure: BIOPSY;  Surgeon: Vida Rigger, MD;  Location: [facility] ENDOSCOPY;  Service: Endoscopy;;  . ESOPHAGEAL BRUSHING  09/30/2018   Procedure: ESOPHAGEAL BRUSHING;  Surgeon: Vida Rigger, MD;  Location: [facility] ENDOSCOPY;  Service:  Endoscopy;;  . ESOPHAGOGASTRODUODENOSCOPY (EGD) WITH PROPOFOL N/A 09/30/2018   Procedure: ESOPHAGOGASTRODUODENOSCOPY (EGD) WITH PROPOFOL;  Surgeon: Vida Rigger, MD;  Location: [facility] ENDOSCOPY;  Service: Endoscopy;  Laterality: N/A;     reports that he has quit smoking. His smoking use included cigarettes. He has never used smokeless tobacco. He reports that he does not drink alcohol or use drugs.  No Known Allergies  Family hx of HTN.    Prior to Admission medications   Medication Sig Start Date End Date Taking? Authorizing Provider  alum & mag hydroxide-simeth (MAALOX/MYLANTA) 200-200-20 MG/5ML suspension Take 30 mLs by mouth every 6 (six) hours as needed for indigestion or heartburn. 10/04/18   Glade Lloyd, MD  amLODipine (NORVASC) 5 MG tablet Take 1 tablet (5 mg total) by mouth daily. 10/05/18   Glade Lloyd, MD  atorvastatin (LIPITOR) 20 MG tablet Take 20 mg by mouth daily.    [provider]  DULoxetine (CYMBALTA) 60 MG capsule Take 60 mg by mouth 2 (two) times daily. 07/30/18   [provider]  esomeprazole (NEXIUM) 40 MG capsule Take 1 capsule (40 mg total) by mouth 2 (two) times daily before a meal. 10/04/18   Glade Lloyd, MD  fluconazole (DIFLUCAN) 200 MG tablet Take 1 tablet (200 mg total) by mouth daily for 10 days. 10/22/18 11/01/18  Johnson, Clanford L, MD  gabapentin (NEURONTIN) 100 MG capsule Take 1 capsule (100 mg total) by mouth 3 (three) times daily. 10/21/18   Laural Benes,  Clanford L, MD  Insulin Glargine (BASAGLAR KWIKPEN) 100 UNIT/ML SOPN Inject 0.4 mLs (40 Units total) into the skin at bedtime. 10/19/18   Erick Blinks, MD  insulin lispro (HUMALOG) 100 UNIT/ML injection Inject 0.05 mLs (5 Units total) into the skin 3 (three) times daily before meals. 10/19/18   Erick Blinks, MD  Melatonin 3 MG TABS Take 3 mg by mouth at bedtime.    [provider]  metoCLOPramide (REGLAN) 5 MG tablet Take 1 tablet (5 mg total) by mouth 3 (three) times daily. 10/19/18  10/19/19  Erick Blinks, MD  nystatin cream (MYCOSTATIN) Apply topically 2 (two) times daily. 10/04/18   Glade Lloyd, MD  ondansetron (ZOFRAN-ODT) 4 MG disintegrating tablet Take 1 tablet (4 mg total) by mouth every 6 (six) hours as needed for nausea or vomiting. 10/19/18   Erick Blinks, MD  propranolol (INDERAL) 10 MG tablet Take 10 mg by mouth 3 (three) times daily.    [provider]  QUEtiapine (SEROQUEL) 400 MG tablet Take 400 mg by mouth at bedtime. 07/30/18   [provider]  traZODone (DESYREL) 50 MG tablet Take 50 mg by mouth at bedtime. 07/30/18   [provider]    Physical Exam: Vitals:   10/26/18 1600 10/26/18 1700  BP: (!) 172/97 (!) 158/82  Pulse: (!) 136 (!) 140  Resp: (!) 25   SpO2: 100% 99%  Weight: 70 kg   Height:  (1.803 m)     Constitutional:  mildly agitated Vitals:   10/26/18 1600 10/26/18 1700  BP: (!) 172/97 (!) 158/82  Pulse: (!) 136 (!) 140  Resp: (!) 25   SpO2: 100% 99%  Weight: 70 kg   Height:  (1.803 m)    Eyes: PERRL, lids and conjunctivae normal ENMT: Mucous membranes are Dry Posterior pharynx clear of any exudate or lesions. Neck: normal, supple, no masses, no thyromegaly Respiratory: . Normal respiratory effort. No accessory muscle use.  Cardiovascular: Tachycardic, Regular rate and rhythm No extremity edema. 2+ pedal pulses. Abdomen: no tenderness, no masses palpated. No hepatosplenomegaly. Bowel sounds positive.  Musculoskeletal: no clubbing / cyanosis. No joint deformity upper and lower extremities. Good ROM, no contractures. Normal muscle tone.  Skin: no rashes, lesions, ulcers. No induration Neurologic: CN 2-12 grossly intact.  Strength 5/5 in all 4.  Psychiatric: Normal judgment and insight. Alert and oriented x 3. Normal mood.   Labs on Admission: I have personally reviewed following labs and imaging studies  CBC: Recent Labs  Lab 10/26/18 1602  WBC 10.5  NEUTROABS 8.1*  HGB 12.0*  HCT  38.4*  MCV 92.8  PLT 351   Basic Metabolic Panel: Recent Labs  Lab 10/20/18 0417 10/26/18 1602  NA 137 130*  K 3.8 5.5*  CL 107 97*  CO2 23 9*  GLUCOSE 107* 566*  BUN 15 19  CREATININE 0.56* 1.13  CALCIUM 8.5* 9.4  MG 1.8 1.8   Cardiac Enzymes: Recent Labs  Lab 10/26/18 1602  TROPONINI <0.03   CBG: Recent Labs  Lab 10/21/18 0012 10/21/18 0406 10/21/18 0731 10/21/18 1113 10/26/18 1558  GLUCAP 248* 117* 242* 172* 529*   Urine analysis:    Component Value Date/Time   COLORURINE STRAW (A) 10/26/2018 1603   APPEARANCEUR HAZY (A) 10/26/2018 1603   LABSPEC 1.017 10/26/2018 1603   PHURINE 5.0 10/26/2018 1603   GLUCOSEU >=500 (A) 10/26/2018 1603   HGBUR MODERATE (A) 10/26/2018 1603   BILIRUBINUR NEGATIVE 10/26/2018 1603   KETONESUR 80 (A) 10/26/2018 1603  PROTEINUR NEGATIVE 10/26/2018 1603   NITRITE NEGATIVE 10/26/2018 1603   LEUKOCYTESUR SMALL (A) 10/26/2018 1603    Radiological Exams on Admission: Dg Chest Portable 1 View  Result Date: 10/26/2018 CLINICAL DATA:  58 year old male with a history diabetes EXAM: PORTABLE CHEST 1 VIEW COMPARISON:  10/09/2018 FINDINGS: Cardiomediastinal silhouette unchanged in size and contour. No evidence of central vascular congestion. Low lung volumes with coarsened interstitial markings, similar to the prior. No confluent airspace disease, pneumothorax, or pleural effusion. IMPRESSION: Low lung volumes without evidence of acute cardiopulmonary disease Electronically Signed   By: Gilmer Mor D.O.   On: 10/26/2018 16:30    EKG: Independently reviewed. Sinus tach. Unchanged from prior.  Assessment/Plan Active Problems:   DKA (diabetic ketoacidoses) (HCC)  DKA- glucose- 566, anion gap- 24, Bicarb- 9.  Elevated beta-hydroxybutyric acid. Hx of non-compliance, also probable UTI contributing. Uncontrolled DM- Hgba1c 09/19/18- 11.4, with peripheral neuropathy. Recent hospitalizations for same. 2L bolus given in ED.  - Cont Regular  insulin - N/s 125cc/hr  - Monitor BMP, k closely - NPO except for meds - Cont home gabapentin.  AKI- Cr- 1.13, baseline 0.5- 0.7. Likely pre-renal from hyperglycemia and polyuria.  - Hydrate - BMP a.m  Probable UTI- UA- rare bact, >50 WBC, small leuks. Also with chronic hematuria. Reports buring with urination. 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 He is completing a course of fluconazole.  DC fluconazole after 10 more days of treatment.    HTN- elevated.  - Resume home Norvasc, propanolol  BPD- Agitated in ED.  - Resume home seroquel, cymbalta, QHS trazodone.   DVT prophylaxis: Lovenox Code Status: Full Family Communication: None at bedside Disposition Plan: Per rounding team Consults called: None Admission status: Inpatient, Step down I certify that at the point of admission it is my clinical judgment that the patient will require inpatient hospital care spanning beyond 2 midnights from the point of admission due to high intensity of service, high risk for further deterioration and high frequency of surveillance required. The following factors support the patient status of inpatient:  DKA,  requiring step down level of service as he is on continuous insulin infusion requiring frequent gluocse and electrolytes monitoring.    Onnie Boer MD [facility]  10/26/2018, 5:31 PM

## 2018-10-26 NOTE — ED Provider Notes (Signed)
[facility] EMERGENCY DEPARTMENT Provider Note   CSN: [id number] Arrival date & time: 10/26/18  1553    History   Chief Complaint Chief Complaint  Patient presents with  . Hyperglycemia    HPI Omar Wood is a 58 y.o. male.     Pt presents to the ED today with hyperglycemia.  The pt was admitted from 3/27-31 for DKA.  He was d/c to the [place] [facility] and has been refusing to take his insulin saying that it does not work.   The pt is a difficult historian because he is tangential and argumentative, but said he's been losing weight, his legs hurt, and he's been nauseous.  The pt denies f/c or sob.     Past Medical History:  Diagnosis Date  . Bipolar disorder (HCC)   . Diabetes mellitus without complication (HCC)   . Hypercholesteremia   . Hypertension   . Marijuana user   . Scrotal injury   . Tobacco abuse     Patient Active Problem List   Diagnosis Date Noted  . DKA, type 1 (HCC) 10/17/2018  . Bipolar disorder (HCC)   . Hypertension   . Hypercholesteremia   . DKA (diabetic ketoacidoses) (HCC) 09/17/2018    Past Surgical History:  Procedure Laterality Date  . APPENDECTOMY    . BIOPSY  09/30/2018   Procedure: BIOPSY;  Surgeon: Vida Rigger, MD;  Location: [facility] ENDOSCOPY;  Service: Endoscopy;;  . ESOPHAGEAL BRUSHING  09/30/2018   Procedure: ESOPHAGEAL BRUSHING;  Surgeon: Vida Rigger, MD;  Location: [facility] ENDOSCOPY;  Service: Endoscopy;;  . ESOPHAGOGASTRODUODENOSCOPY (EGD) WITH PROPOFOL N/A 09/30/2018   Procedure: ESOPHAGOGASTRODUODENOSCOPY (EGD) WITH PROPOFOL;  Surgeon: Vida Rigger, MD;  Location: [facility] ENDOSCOPY;  Service: Endoscopy;  Laterality: N/A;        Home Medications    Prior to Admission medications   Medication Sig Start Date End Date Taking? Authorizing Provider  alum & mag hydroxide-simeth (MAALOX/MYLANTA) 200-200-20 MG/5ML suspension Take 30 mLs by mouth every 6 (six) hours as needed for indigestion or heartburn. 10/04/18   Glade Lloyd, MD  amLODipine  (NORVASC) 5 MG tablet Take 1 tablet (5 mg total) by mouth daily. 10/05/18   Glade Lloyd, MD  atorvastatin (LIPITOR) 20 MG tablet Take 20 mg by mouth daily.    [provider]  DULoxetine (CYMBALTA) 60 MG capsule Take 60 mg by mouth 2 (two) times daily. 07/30/18   [provider]  esomeprazole (NEXIUM) 40 MG capsule Take 1 capsule (40 mg total) by mouth 2 (two) times daily before a meal. 10/04/18   Glade Lloyd, MD  fluconazole (DIFLUCAN) 200 MG tablet Take 1 tablet (200 mg total) by mouth daily for 10 days. 10/22/18 11/01/18  Johnson, Clanford L, MD  gabapentin (NEURONTIN) 100 MG capsule Take 1 capsule (100 mg total) by mouth 3 (three) times daily. 10/21/18   Johnson, Clanford L, MD  Insulin Glargine (BASAGLAR KWIKPEN) 100 UNIT/ML SOPN Inject 0.4 mLs (40 Units total) into the skin at bedtime. 10/19/18   Erick Blinks, MD  insulin lispro (HUMALOG) 100 UNIT/ML injection Inject 0.05 mLs (5 Units total) into the skin 3 (three) times daily before meals. 10/19/18   Erick Blinks, MD  Melatonin 3 MG TABS Take 3 mg by mouth at bedtime.    [provider]  metoCLOPramide (REGLAN) 5 MG tablet Take 1 tablet (5 mg total) by mouth 3 (three) times daily. 10/19/18 10/19/19  Erick Blinks, MD  nystatin cream (MYCOSTATIN) Apply topically 2 (two) times daily. 10/04/18  Glade Lloyd, MD  ondansetron (ZOFRAN-ODT) 4 MG disintegrating tablet Take 1 tablet (4 mg total) by mouth every 6 (six) hours as needed for nausea or vomiting. 10/19/18   Erick Blinks, MD  propranolol (INDERAL) 10 MG tablet Take 10 mg by mouth 3 (three) times daily.    [provider]  QUEtiapine (SEROQUEL) 400 MG tablet Take 400 mg by mouth at bedtime. 07/30/18   [provider]  traZODone (DESYREL) 50 MG tablet Take 50 mg by mouth at bedtime. 07/30/18   [provider]    Family History No family history on file.  Social History Social History   Tobacco Use  . Smoking status: Former Smoker     Types: Cigarettes  . Smokeless tobacco: Never Used  Substance Use Topics  . Alcohol use: No    Frequency: Never  . Drug use: No     Allergies   Patient has no known allergies.   Review of Systems Review of Systems  Constitutional: Positive for unexpected weight change.  Gastrointestinal: Positive for nausea.  Endocrine:       Elevated blood sugar  Neurological: Positive for weakness.  All other systems reviewed and are negative.    Physical Exam Updated Vital Signs BP (!) 158/82   Pulse (!) 140   Resp (!) 25   Ht  (1.803 m)   Wt 70 kg   SpO2 99%   BMI 21.52 kg/m   Physical Exam Vitals signs and nursing note reviewed.  Constitutional:      Appearance: Normal appearance.  HENT:     Head: Normocephalic and atraumatic.     Right Ear: External ear normal.     Left Ear: External ear normal.     Nose: Nose normal.     Mouth/Throat:     Mouth: Mucous membranes are dry.  Eyes:     Extraocular Movements: Extraocular movements intact.     Conjunctiva/sclera: Conjunctivae normal.     Pupils: Pupils are equal, round, and reactive to light.  Neck:     Musculoskeletal: Normal range of motion and neck supple.  Cardiovascular:     Rate and Rhythm: Regular rhythm. Tachycardia present.     Pulses: Normal pulses.     Heart sounds: Normal heart sounds.  Pulmonary:     Effort: Pulmonary effort is normal.     Breath sounds: Normal breath sounds.  Abdominal:     General: Abdomen is flat. Bowel sounds are normal.     Palpations: Abdomen is soft.  Musculoskeletal: Normal range of motion.  Skin:    General: Skin is warm.     Capillary Refill: Capillary refill takes less than 2 seconds.  Neurological:     General: No focal deficit present.     Mental Status: He is alert and oriented to person, place, and time.  Psychiatric:        Mood and Affect: Affect is angry.        Speech: Speech is tangential.        Behavior: Behavior is agitated.      ED Treatments /  Results  Labs (all labs ordered are listed, but only abnormal results are displayed) Labs Reviewed  BASIC METABOLIC PANEL - Abnormal; Notable for the following components:      Result Value   Sodium 130 (*)    Potassium 5.5 (*)    Chloride 97 (*)    CO2 9 (*)    Glucose, Bld 566 (*)  Anion gap 24 (*)    All other components within normal limits  BLOOD GAS, VENOUS - Abnormal; Notable for the following components:   pH, Ven 7.051 (*)    pCO2, Ven 37.7 (*)    pO2, Ven <31.0 (*)    Bicarbonate 9.3 (*)    Acid-base deficit 18.4 (*)    All other components within normal limits  CBC WITH DIFFERENTIAL/PLATELET - Abnormal; Notable for the following components:   RBC 4.14 (*)    Hemoglobin 12.0 (*)    HCT 38.4 (*)    Neutro Abs 8.1 (*)    Abs Immature Granulocytes 0.10 (*)    All other components within normal limits  URINALYSIS, ROUTINE W REFLEX MICROSCOPIC - Abnormal; Notable for the following components:   Color, Urine STRAW (*)    APPearance HAZY (*)    Glucose, UA >=500 (*)    Hgb urine dipstick MODERATE (*)    Ketones, ur 80 (*)    Leukocytes,Ua SMALL (*)    WBC, UA >50 (*)    Bacteria, UA RARE (*)    All other components within normal limits  BETA-HYDROXYBUTYRIC ACID - Abnormal; Notable for the following components:   Beta-Hydroxybutyric Acid >8.00 (*)    All other components within normal limits  CBG MONITORING, ED - Abnormal; Notable for the following components:   Glucose-Capillary 529 (*)    All other components within normal limits  RAPID URINE DRUG SCREEN, HOSP PERFORMED  ETHANOL  MAGNESIUM  TROPONIN I    EKG EKG Interpretation  Date/Time:  Sunday October 26 2018 15:56:48 EDT Ventricular Rate:  131 PR Interval:    QRS Duration: 91 QT Interval:  305 QTC Calculation: 451 R Axis:   60 Text Interpretation:  Sinus tachycardia Multiform ventricular premature complexes Borderline T wave abnormalities No significant change since last tracing Confirmed by  Jacalyn Lefevre [phone]) on 10/26/2018 4:20:32 PM   Radiology Dg Chest Portable 1 View  Result Date: 10/26/2018 CLINICAL DATA:  58 year old male with a history diabetes EXAM: PORTABLE CHEST 1 VIEW COMPARISON:  10/09/2018 FINDINGS: Cardiomediastinal silhouette unchanged in size and contour. No evidence of central vascular congestion. Low lung volumes with coarsened interstitial markings, similar to the prior. No confluent airspace disease, pneumothorax, or pleural effusion. IMPRESSION: Low lung volumes without evidence of acute cardiopulmonary disease Electronically Signed   By: Gilmer Mor D.O.   On: 10/26/2018 16:30    Procedures Procedures (including critical care time)  Medications Ordered in ED Medications  sodium chloride 0.9 % bolus 1,000 mL (1,000 mLs Intravenous New Bag/Given 10/26/18 1618)    And  0.9 %  sodium chloride infusion (has no administration in time range)  insulin regular, human (MYXREDLIN) 100 units/ 100 mL infusion (4.7 Units/hr Intravenous New Bag/Given 10/26/18 1714)  sodium chloride 0.9 % bolus 1,000 mL (1,000 mLs Intravenous New Bag/Given 10/26/18 1622)  ondansetron (ZOFRAN) injection 4 mg (4 mg Intravenous Given 10/26/18 1612)  morphine 4 MG/ML injection 4 mg (4 mg Intravenous Given 10/26/18 1618)  LORazepam (ATIVAN) injection 1 mg (1 mg Intravenous Given 10/26/18 1620)     Initial Impression / Assessment and Plan / ED Course  I have reviewed the triage vital signs and the nursing notes.  Pertinent labs & imaging results that were available during my care of the patient were reviewed by me and considered in my medical decision making (see chart for details).  CRITICAL CARE Performed by: Jacalyn Lefevre   Total critical care time: 30 minutes  Critical  care time was exclusive of separately billable procedures and treating other patients.  Critical care was necessary to treat or prevent imminent or life-threatening deterioration.  Critical care was time spent  personally by me on the following activities: development of treatment plan with patient and/or surrogate as well as nursing, discussions with consultants, evaluation of patient's response to treatment, examination of patient, obtaining history from patient or surrogate, ordering and performing treatments and interventions, ordering and review of laboratory studies, ordering and review of radiographic studies, pulse oximetry and re-evaluation of patient's condition.    Pt is feeling better after the initial IVFs, ativan, morphine, and zofran.    He is in DKA and is started on an insulin drip.  CXR nl.  UA shows no infection.   Pt d/w Dr. Mariea Clonts (triad) for admission.  Final Clinical Impressions(s) / ED Diagnoses   Final diagnoses:  Diabetic ketoacidosis without coma associated with type 1 diabetes mellitus (HCC)  Noncompliance with medications  Anxiety    ED Discharge Orders    None       Jacalyn Lefevre, MD 10/26/18 1734

## 2018-10-26 NOTE — ED Notes (Signed)
ED TO INPATIENT HANDOFF REPORT  ED Nurse Name and Phone #: Herbert Seta   S Name/Age/Gender Omar Wood 58 y.o. male Room/Bed: APA01/APA01  Code Status   Code Status: Prior  Home/SNF/Other Nursing Home Patient oriented to: self, place, time and situation Is this baseline? Yes   Triage Complete: Triage complete  Chief Complaint HIGH BLOOD SUGAR  Triage Note Per EMS: patient from [place] [facility]. Patient was sent here because he is not compliant at nursing home. Patient refusing to take insulin because he states, "the sliding scale does not work for him." Patient is agitated in triage and combative.  Patient's CBG per EMS was 569.   Allergies No Known Allergies  Level of Care/Admitting Diagnosis ED Disposition    ED Disposition Condition Comment   Admit  Hospital Area: [facility] [[id number]]  Level of Care: Stepdown [14]  Diagnosis: DKA (diabetic ketoacidoses) [facility]) [[id number]]  Admitting Physician: Onnie Boer [phone]  Attending Physician: Onnie Boer [phone]  Estimated length of stay: past midnight tomorrow  Certification:: I certify this patient will need inpatient services for at least 2 midnights  PT Class (Do Not Modify): Inpatient [101]  PT Acc Code (Do Not Modify): Private [1]       B Medical/Surgery History Past Medical History:  Diagnosis Date  . Bipolar disorder (HCC)   . Diabetes mellitus without complication (HCC)   . Hypercholesteremia   . Hypertension   . Marijuana user   . Scrotal injury   . Tobacco abuse    Past Surgical History:  Procedure Laterality Date  . APPENDECTOMY    . BIOPSY  09/30/2018   Procedure: BIOPSY;  Surgeon: Vida Rigger, MD;  Location: [facility] ENDOSCOPY;  Service: Endoscopy;;  . ESOPHAGEAL BRUSHING  09/30/2018   Procedure: ESOPHAGEAL BRUSHING;  Surgeon: Vida Rigger, MD;  Location: [facility] ENDOSCOPY;  Service: Endoscopy;;  . ESOPHAGOGASTRODUODENOSCOPY (EGD) WITH PROPOFOL N/A 09/30/2018   Procedure:  ESOPHAGOGASTRODUODENOSCOPY (EGD) WITH PROPOFOL;  Surgeon: Vida Rigger, MD;  Location: [facility] ENDOSCOPY;  Service: Endoscopy;  Laterality: N/A;     A IV Location/Drains/Wounds Patient Lines/Drains/Airways Status   Active Line/Drains/Airways    Name:   Placement date:   Placement time:   Site:   Days:   Peripheral IV 10/26/18 Right Antecubital   10/26/18    1616    Antecubital   less than 1   Peripheral IV 10/26/18 Left Antecubital   10/26/18    1818    Antecubital   less than 1          Intake/Output Last 24 hours  Intake/Output Summary (Last 24 hours) at 10/26/2018 1825 Last data filed at 10/26/2018 1818 Gross per 24 hour  Intake 1000 ml  Output -  Net 1000 ml    Labs/Imaging Results for orders placed or performed during the hospital encounter of 10/26/18 (from the past 48 hour(s))  CBG monitoring, ED     Status: Abnormal   Collection Time: 10/26/18  3:58 PM  Result Value Ref Range   Glucose-Capillary 529 (HH) 70 - 99 mg/dL   Comment 1 Notify RN   Basic metabolic panel     Status: Abnormal   Collection Time: 10/26/18  4:02 PM  Result Value Ref Range   Sodium 130 (L) 135 - 145 mmol/L   Potassium 5.5 (H) 3.5 - 5.1 mmol/L   Chloride 97 (L) 98 - 111 mmol/L   CO2 9 (L) 22 - 32 mmol/L   Glucose, Bld 566 (HH) 70 - 99 mg/dL  Comment: CRITICAL RESULT CALLED TO, READ BACK BY AND VERIFIED WITH: Marlies Ligman,H. AT 1655 ON 10/26/2018 BY EVA    BUN 19 6 - 20 mg/dL   Creatinine, Ser 0.98 0.61 - 1.24 mg/dL   Calcium 9.4 8.9 - 11.9 mg/dL   GFR calc non Af Amer >60 >60 mL/min   GFR calc Af Amer >60 >60 mL/min   Anion gap 24 (H) 5 - 15    Comment: Performed at [facility], [address], [place], [place] [address]  CBC with Differential (PNL)     Status: Abnormal   Collection Time: 10/26/18  4:02 PM  Result Value Ref Range   WBC 10.5 4.0 - 10.5 K/uL   RBC 4.14 (L) 4.22 - 5.81 MIL/uL   Hemoglobin 12.0 (L) 13.0 - 17.0 g/dL   HCT 95.6 (L) 21.3 - 08.6 %   MCV 92.8 80.0 - 100.0 fL   MCH 29.0  26.0 - 34.0 pg   MCHC 31.3 30.0 - 36.0 g/dL   RDW 57.8 46.9 - 62.9 %   Platelets 351 150 - 400 K/uL   nRBC 0.0 0.0 - 0.2 %   Neutrophils Relative % 77 %   Neutro Abs 8.1 (H) 1.7 - 7.7 K/uL   Lymphocytes Relative 16 %   Lymphs Abs 1.7 0.7 - 4.0 K/uL   Monocytes Relative 5 %   Monocytes Absolute 0.5 0.1 - 1.0 K/uL   Eosinophils Relative 0 %   Eosinophils Absolute 0.0 0.0 - 0.5 K/uL   Basophils Relative 1 %   Basophils Absolute 0.1 0.0 - 0.1 K/uL   Immature Granulocytes 1 %   Abs Immature Granulocytes 0.10 (H) 0.00 - 0.07 K/uL    Comment: Performed at [facility], [address], [place], [place] [address]  Beta-hydroxybutyric acid     Status: Abnormal   Collection Time: 10/26/18  4:02 PM  Result Value Ref Range   Beta-Hydroxybutyric Acid >8.00 (H) 0.05 - 0.27 mmol/L    Comment: RESULTS CONFIRMED BY MANUAL DILUTION Performed at [facility], [address], [place], [place] [address]   Rapid urine drug screen (hospital performed)     Status: None   Collection Time: 10/26/18  4:02 PM  Result Value Ref Range   Opiates NONE DETECTED NONE DETECTED   Cocaine NONE DETECTED NONE DETECTED   Benzodiazepines NONE DETECTED NONE DETECTED   Amphetamines NONE DETECTED NONE DETECTED   Tetrahydrocannabinol NONE DETECTED NONE DETECTED   Barbiturates NONE DETECTED NONE DETECTED    Comment: (NOTE) DRUG SCREEN FOR MEDICAL PURPOSES ONLY.  IF CONFIRMATION IS NEEDED FOR ANY PURPOSE, NOTIFY LAB WITHIN 5 DAYS. LOWEST DETECTABLE LIMITS FOR URINE DRUG SCREEN Drug Class                     Cutoff (ng/mL) Amphetamine and metabolites    1000 Barbiturate and metabolites    200 Benzodiazepine                 200 Tricyclics and metabolites     300 Opiates and metabolites        300 Cocaine and metabolites        300 THC                            50 Performed at [facility], [address] [place], [place] [address]   Ethanol     Status: None   Collection Time: 10/26/18  4:02 PM  Result Value Ref  Range   Alcohol, Ethyl (B) <10 <10 mg/dL    Comment: (NOTE) Lowest detectable limit for serum alcohol is 10 mg/dL. For medical purposes only. Performed at [facility], [address], [place], [place] [address]   Magnesium     Status: None   Collection Time: 10/26/18  4:02 PM  Result Value Ref Range   Magnesium 1.8 1.7 - 2.4 mg/dL    Comment: Performed at [facility], [address] [place], [place] [address]  Troponin I - ONCE - STAT     Status: None   Collection Time: 10/26/18  4:02 PM  Result Value Ref Range   Troponin I <0.03 <0.03 ng/mL    Comment: Performed at [facility], [address] [place], [place] [address]  Urinalysis, Routine w reflex microscopic     Status: Abnormal   Collection Time: 10/26/18  4:03 PM  Result Value Ref Range   Color, Urine STRAW (A) YELLOW   APPearance HAZY (A) CLEAR   Specific Gravity, Urine 1.017 1.005 - 1.030   pH 5.0 5.0 - 8.0   Glucose, UA >=500 (A) NEGATIVE mg/dL   Hgb urine dipstick MODERATE (A) NEGATIVE   Bilirubin Urine NEGATIVE NEGATIVE   Ketones, ur 80 (A) NEGATIVE mg/dL   Protein, ur NEGATIVE NEGATIVE mg/dL   Nitrite NEGATIVE NEGATIVE   Leukocytes,Ua SMALL (A) NEGATIVE   RBC / HPF 0-5 0 - 5 RBC/hpf   WBC, UA >50 (H) 0 - 5 WBC/hpf   Bacteria, UA RARE (A) NONE SEEN   WBC Clumps PRESENT    Mucus PRESENT    Budding Yeast PRESENT     Comment: Performed at [facility], [address], [place], [place] [address]  Blood gas, venous     Status: Abnormal   Collection Time: 10/26/18  4:52 PM  Result Value Ref Range   FIO2 21.00    pH, Ven 7.051 (LL) 7.250 - 7.430    Comment: CRITICAL RESULT CALLED TO, READ BACK BY AND VERIFIED WITH: STEINHAUER @ 1712 ON [id number] BY HENDERSONL.    pCO2, Ven 37.7 (L) 44.0 - 60.0 mmHg   pO2, Ven <31.0 (LL) 32.0 - 45.0 mmHg    Comment: CRITICAL RESULT CALLED TO, READ BACK BY AND VERIFIED WITH: STEINHAUER @ 1712 ON [id number] BY HENDERSON L.    Bicarbonate 9.3 (L) 20.0 - 28.0 mmol/L   Acid-base deficit  18.4 (H) 0.0 - 2.0 mmol/L   O2 Saturation 28.2 %   Patient temperature 37.0     Comment: Performed at [facility], [address], [place], [place] [address]  CBG monitoring, ED     Status: Abnormal   Collection Time: 10/26/18  6:05 PM  Result Value Ref Range   Glucose-Capillary 487 (H) 70 - 99 mg/dL   Dg Chest Portable 1 View  Result Date: 10/26/2018 CLINICAL DATA:  58 year old male with a history diabetes EXAM: PORTABLE CHEST 1 VIEW COMPARISON:  10/09/2018 FINDINGS: Cardiomediastinal silhouette unchanged in size and contour. No evidence of central vascular congestion. Low lung volumes with coarsened interstitial markings, similar to the prior. No confluent airspace disease, pneumothorax, or pleural effusion. IMPRESSION: Low lung volumes without evidence of acute cardiopulmonary disease Electronically Signed   By: Gilmer Mor D.O.   On: 10/26/2018 16:30    Pending Labs Unresulted Labs (From admission, onward)    Start     Ordered   10/26/18 2100  Basic metabolic panel  Once,   R     50/09/38  1749   10/26/18 1814  Culture, Urine  Add-on,   R     10/26/18 1813   Signed and Held  Basic metabolic panel  Tomorrow morning,   R     Signed and Held   Signed and Held  CBC  Tomorrow morning,   R     Signed and Held          Vitals/Pain Today's Vitals   10/26/18 1600 10/26/18 1700  BP: (!) 172/97 (!) 158/82  Pulse: (!) 136 (!) 140  Resp: (!) 25   SpO2: 100% 99%  Weight: 70 kg   Height: 5\' 11"  (1.803 m)   PainSc: 10-Worst pain ever     Isolation Precautions No active isolations  Medications Medications  sodium chloride 0.9 % bolus 1,000 mL (1,000 mLs Intravenous New Bag/Given 10/26/18 1618)    And  0.9 %  sodium chloride infusion (has no administration in time range)  insulin regular, human (MYXREDLIN) 100 units/ 100 mL infusion (8.5 Units/hr Intravenous Rate/Dose Change 10/26/18 1819)  sodium chloride 0.9 % bolus 1,000 mL (0 mLs Intravenous Stopped 10/26/18 1818)  ondansetron  (ZOFRAN) injection 4 mg (4 mg Intravenous Given 10/26/18 1612)  morphine 4 MG/ML injection 4 mg (4 mg Intravenous Given 10/26/18 1618)  LORazepam (ATIVAN) injection 1 mg (1 mg Intravenous Given 10/26/18 1620)  LORazepam (ATIVAN) injection 1 mg (1 mg Intravenous Given 10/26/18 1808)    Mobility walks Moderate fall risk   Focused Assessments    R Recommendations: See Admitting Provider Note  Report given to:   Additional Notes:

## 2018-10-26 NOTE — ED Notes (Signed)
Date and time results received: 10/26/18 1705 (use smartphrase ".now" to insert current time)  Test: Glucose Critical Value: 566  Name of Provider Notified: Dr Particia Nearing Orders Received? Or Actions Taken?:NA

## 2018-10-27 ENCOUNTER — Inpatient Hospital Stay (HOSPITAL_COMMUNITY): Payer: Medicaid Other

## 2018-10-27 DIAGNOSIS — N390 Urinary tract infection, site not specified: Secondary | ICD-10-CM

## 2018-10-27 DIAGNOSIS — F319 Bipolar disorder, unspecified: Secondary | ICD-10-CM

## 2018-10-27 DIAGNOSIS — I10 Essential (primary) hypertension: Secondary | ICD-10-CM

## 2018-10-27 DIAGNOSIS — N179 Acute kidney failure, unspecified: Secondary | ICD-10-CM

## 2018-10-27 LAB — BASIC METABOLIC PANEL
Anion gap: 12 (ref 5–15)
Anion gap: 10 (ref 5–15)
Anion gap: 7 (ref 5–15)
BUN: 16 mg/dL (ref 6–20)
BUN: 13 mg/dL (ref 6–20)
BUN: 12 mg/dL (ref 6–20)
CO2: 13 mmol/L — ABNORMAL LOW (ref 22–32)
CO2: 15 mmol/L — ABNORMAL LOW (ref 22–32)
CO2: 18 mmol/L — ABNORMAL LOW (ref 22–32)
Calcium: 8.7 mg/dL — ABNORMAL LOW (ref 8.9–10.3)
Calcium: 8.8 mg/dL — ABNORMAL LOW (ref 8.9–10.3)
Calcium: 8.8 mg/dL — ABNORMAL LOW (ref 8.9–10.3)
Chloride: 110 mmol/L (ref 98–111)
Chloride: 111 mmol/L (ref 98–111)
Chloride: 110 mmol/L (ref 98–111)
Creatinine, Ser: 0.86 mg/dL (ref 0.61–1.24)
Creatinine, Ser: 0.96 mg/dL (ref 0.61–1.24)
Creatinine, Ser: 0.96 mg/dL (ref 0.61–1.24)
GFR calc Af Amer: >60 mL/min (ref >60)
GFR calc Af Amer: >60 mL/min (ref >60)
GFR calc Af Amer: >60 mL/min (ref >60)
GFR calc non Af Amer: >60 mL/min (ref >60)
GFR calc non Af Amer: >60 mL/min (ref >60)
GFR calc non Af Amer: >60 mL/min (ref >60)
Glucose, Bld: 165 mg/dL — ABNORMAL HIGH (ref 70–99)
Glucose, Bld: 170 mg/dL — ABNORMAL HIGH (ref 70–99)
Glucose, Bld: 339 mg/dL — ABNORMAL HIGH (ref 70–99)
Potassium: 4 mmol/L (ref 3.5–5.1)
Potassium: 3.9 mmol/L (ref 3.5–5.1)
Potassium: 4 mmol/L (ref 3.5–5.1)
Sodium: 135 mmol/L (ref 135–145)
Sodium: 136 mmol/L (ref 135–145)
Sodium: 135 mmol/L (ref 135–145)

## 2018-10-27 LAB — CBC
HCT: 31 % — ABNORMAL LOW (ref 39.0–52.0)
Hemoglobin: 10.1 g/dL — ABNORMAL LOW (ref 13.0–17.0)
MCH: 29.4 pg (ref 26.0–34.0)
MCHC: 32.6 g/dL (ref 30.0–36.0)
MCV: 90.1 fL (ref 80.0–100.0)
Platelets: 293 10*3/uL (ref 150–400)
RBC: 3.44 MIL/uL — ABNORMAL LOW (ref 4.22–5.81)
RDW: 13 % (ref 11.5–15.5)
WBC: 9.2 10*3/uL (ref 4.0–10.5)
nRBC: 0 % (ref 0.0–0.2)

## 2018-10-27 LAB — GLUCOSE, CAPILLARY
Glucose-Capillary: 127 mg/dL — ABNORMAL HIGH (ref 70–99)
Glucose-Capillary: 133 mg/dL — ABNORMAL HIGH (ref 70–99)
Glucose-Capillary: 139 mg/dL — ABNORMAL HIGH (ref 70–99)
Glucose-Capillary: 151 mg/dL — ABNORMAL HIGH (ref 70–99)
Glucose-Capillary: 157 mg/dL — ABNORMAL HIGH (ref 70–99)
Glucose-Capillary: 157 mg/dL — ABNORMAL HIGH (ref 70–99)
Glucose-Capillary: 160 mg/dL — ABNORMAL HIGH (ref 70–99)
Glucose-Capillary: 164 mg/dL — ABNORMAL HIGH (ref 70–99)
Glucose-Capillary: 167 mg/dL — ABNORMAL HIGH (ref 70–99)
Glucose-Capillary: 180 mg/dL — ABNORMAL HIGH (ref 70–99)
Glucose-Capillary: 216 mg/dL — ABNORMAL HIGH (ref 70–99)
Glucose-Capillary: 218 mg/dL — ABNORMAL HIGH (ref 70–99)
Glucose-Capillary: 295 mg/dL — ABNORMAL HIGH (ref 70–99)
Glucose-Capillary: 343 mg/dL — ABNORMAL HIGH (ref 70–99)

## 2018-10-27 MED ORDER — CEPHALEXIN 500 MG PO CAPS
500.0000 mg | ORAL_CAPSULE | Freq: Four times a day (QID) | ORAL | Status: DC
  Administered 2018-10-27 – 2018-10-28 (×6): 500 mg via ORAL
  Filled 2018-10-27 (×11): qty 1

## 2018-10-27 MED ORDER — INSULIN GLARGINE 100 UNIT/ML ~~LOC~~ SOLN
20.0000 [IU] | Freq: Two times a day (BID) | SUBCUTANEOUS | Status: DC
  Administered 2018-10-27 (×2): 20 [IU] via SUBCUTANEOUS
  Filled 2018-10-27 (×3): qty 0.2

## 2018-10-27 MED ORDER — LACTATED RINGERS IV SOLN
INTRAVENOUS | Status: DC
  Administered 2018-10-27 – 2018-10-28 (×2): via INTRAVENOUS

## 2018-10-27 MED ORDER — INSULIN ASPART 100 UNIT/ML ~~LOC~~ SOLN
0.0000 [IU] | Freq: Three times a day (TID) | SUBCUTANEOUS | Status: DC
  Administered 2018-10-27: 17:00:00 9 [IU] via SUBCUTANEOUS
  Administered 2018-10-27: 11:00:00 3 [IU] via SUBCUTANEOUS
  Administered 2018-10-28: 12:00:00 2 [IU] via SUBCUTANEOUS
  Administered 2018-10-28: 08:00:00 1 [IU] via SUBCUTANEOUS

## 2018-10-27 NOTE — Progress Notes (Signed)
PROGRESS Wood    Omar Wood  PPI:[id number] DOB: 01-05-1961 DOA: 10/26/2018 PCP: Gennette Pac, NP    Brief Narrative:  58 year old male who presented with hyperglycemia.  He does have significant past medical history for type 2 diabetes mellitus and hypertension.  The patient was refusing his medications.  He became agitated and combative.  Patient is a resident from a local nursing home.  On his initial physical examination blood pressure 172/97, heart rate 136, respiratory rate 25, oxygen saturation 99%, he had dry mucous membranes, heart S1-S2 present, tachycardic his lungs were clear to auscultation bilaterally, his abdomen was soft nontender, no lower extremity edema.  A venous blood gas had a pH of 7.0, sodium 130, potassium 5.5, chloride 97, bicarb 9, glucose 566, BUN 19, creatinine 1.13, anion gap 24, magnesium 1.8, troponin less than 0.03, white count 10.5, hemoglobin 12.0, hematocrit 38.4, platelets 351. Urineanalysis had greater than 500 glucose, wbc > 50. Urine drug screen negative.  His chest x-ray had hyperinflation, no infiltrates, EKG, 130 bpm, normal intervals, normal axis, sinus rhythm, no ST segment or T wave changes.  Patient was admitted to the hospital with a working diagnosis of acute diabetes ketoacidosis complicated by hyponatremia and acute kidney injury and urinary tract infection.    Assessment & Plan:   Active Problems:   DKA (diabetic ketoacidoses) (HCC)   1. DKA. Patient is feeling better this am, no nausea or vomiting, no abdominal pain. His anion gap has closed to 10 and his am glucose is 170 mg/dl. At home on 40 units of glargine daily and 3 units of pre-meal rapid acting insulin. Will resume long acting insulin with 20 units bid of glargine and insulin sliding scale, advance diet to regular/ diabetic prudent. DC Iv dextrose.   2. AKI and non-gap metabolic acidosis. Improved renal function with serum cr at 0.96 with K at 3,9 and serum bicarbonate at 15. Will  resume gentle hydration with balance electrolyte solutions, and will follow on renal panel in am.   3. Urinary tract infection. Will continue antibiotic therapy with cephalexin. Patient does urinary symptoms, will check renal US.   4. Candida esophagitis. Continue fluconazole therapy.   5. HTN. Continue blood pressure control with amlodipine and propranolol.   6. Bipolar disorder. Continue quetiapine, duloxetine and trazodone. This am patient is not agitated and is cooperating with medical treatment including insulin therapy. Patient received lorazempam yesterday.   DVT prophylaxis: enoxaparin  Code Status: full Family Communication: no family at the bedside  Disposition Plan/ discharge barriers: pending clinical improvement.   Body mass index is 20.58 kg/m. Malnutrition Type:      Malnutrition Characteristics:      Nutrition Interventions:     RN Pressure Injury Documentation:     Consultants:     Procedures:     Antimicrobials:       Subjective: Patient is feeling well, no nausea or vomiting, no abdominal pain, no chest pain or dyspnea. Cooperating this am with medical therapy including insulin therapy.   Objective: Vitals:   10/27/18 0000 10/27/18 0400 10/27/18 0500 10/27/18 0725  BP: (!) 158/82 (!) 160/88    Pulse:    (!) 125  Resp:    20  Temp: 99.4 F (37.4 C) 99.9 F (37.7 C)  99.3 F (37.4 C)  TempSrc: Oral Oral  Oral  SpO2:    98%  Weight:   63.2 kg   Height:        Intake/Output Summary (Last 24  hours) at 10/27/2018 0753 Last data filed at 10/27/2018 0700 Gross per 24 hour  Intake 2345.64 ml  Output 750 ml  Net 1595.64 ml   Filed Weights   10/26/18 1600 10/26/18 1900 10/27/18 0500  Weight: 70 kg 65.1 kg 63.2 kg    Examination:   General: Not in pain or dyspnea, deconditioned  Neurology: Awake and alert, non focal  E ENT: mild pallor, no icterus, oral mucosa moist Cardiovascular: No JVD. S1-S2 present, rhythmic, no gallops,  rubs, or murmurs. No lower extremity edema. Pulmonary: positive breath sounds bilaterally, adequate air movement, no wheezing, rhonchi or rales. Gastrointestinal. Abdomen with no organomegaly, non tender, no rebound or guarding Skin. No rashes Musculoskeletal: no joint deformities     Data Reviewed: I have personally reviewed following labs and imaging studies  CBC: Recent Labs  Lab 10/26/18 1602 10/27/18 0514  WBC 10.5 9.2  NEUTROABS 8.1*  --   HGB 12.0* 10.1*  HCT 38.4* 31.0*  MCV 92.8 90.1  PLT 351 293   Basic Metabolic Panel: Recent Labs  Lab 10/26/18 1602 10/26/18 2325 10/27/18 0514  NA 130* 135 136  K 5.5* 4.0 3.9  CL 97* 110 111  CO2 9* 13* 15*  GLUCOSE 566* 165* 170*  BUN 19 16 13   CREATININE 1.13 0.86 0.96  CALCIUM 9.4 8.7* 8.8*  MG 1.8  --   --    GFR: Estimated Creatinine Clearance: 75 mL/min (by C-G formula based on SCr of 0.96 mg/dL). Liver Function Tests: No results for input(s): AST, ALT, ALKPHOS, BILITOT, PROT, ALBUMIN in the last 168 hours. No results for input(s): LIPASE, AMYLASE in the last 168 hours. No results for input(s): AMMONIA in the last 168 hours. Coagulation Profile: No results for input(s): INR, PROTIME in the last 168 hours. Cardiac Enzymes: Recent Labs  Lab 10/26/18 1602  TROPONINI <0.03   BNP (last 3 results) No results for input(s): PROBNP in the last 8760 hours. HbA1C: No results for input(s): HGBA1C in the last 72 hours. CBG: Recent Labs  Lab 10/27/18 0322 10/27/18 0402 10/27/18 0456 10/27/18 0558 10/27/18 0652  GLUCAP 151* 157* 167* 157* 164*   Lipid Profile: No results for input(s): CHOL, HDL, LDLCALC, TRIG, CHOLHDL, LDLDIRECT in the last 72 hours. Thyroid Function Tests: No results for input(s): TSH, T4TOTAL, FREET4, T3FREE, THYROIDAB in the last 72 hours. Anemia Panel: No results for input(s): VITAMINB12, FOLATE, FERRITIN, TIBC, IRON, RETICCTPCT in the last 72 hours.    Radiology Studies: I have  reviewed all of the imaging during this hospital visit personally     Scheduled Meds: . amLODipine  5 mg Oral Daily  . atorvastatin  20 mg Oral Daily  . Chlorhexidine Gluconate Cloth  6 each Topical Q0600  . DULoxetine  60 mg Oral BID  . enoxaparin (LOVENOX) injection  40 mg Subcutaneous Q24H  . fluconazole  200 mg Oral Daily  . gabapentin  100 mg Oral TID  . pantoprazole  40 mg Oral Daily  . propranolol  10 mg Oral TID  . QUEtiapine  300 mg Oral QHS  . traZODone  50 mg Oral QHS   Continuous Infusions: . cefTRIAXone (ROCEPHIN)  IV 1 g (10/26/18 1958)  . dextrose 5 % and 0.45% NaCl 100 mL/hr at 10/27/18 0735  . insulin 8.5 mL/hr at 10/26/18 1841     LOS: 1 day         Annett Gula, MD

## 2018-10-27 NOTE — TOC Initial Note (Signed)
Transition of Care [facility]) - Initial/Assessment Note    Patient Details  Name: Omar Wood MRN: [id number] Date of Birth: 1961-02-27  [facility] CM/SW Contact:    Elliot Gault, LCSW Phone Number: 10/27/2018, 12:49 PM  Clinical Narrative:                 Pt admitted from [facility]. He was discharged there from [facility] about a week ago for short term rehab. Will ask MD for PT order to re-assess need to return to SNF at dc. Will follow.  Expected Discharge Plan: Skilled Nursing Facility Barriers to Discharge: Continued Medical Work up   Patient Goals and CMS Choice        Expected Discharge Plan and Services Expected Discharge Plan: Skilled Nursing Facility In-house Referral: Clinical Social Work     Living arrangements for the past 2 months: Single Family Home, Skilled Nursing Facility                          Prior Living Arrangements/Services Living arrangements for the past 2 months: Single Family Home, Skilled Nursing Facility Lives with:: Self Patient language and need for interpreter reviewed:: Yes Do you feel safe going back to the place where you live?: Yes      Need for Family Participation in Patient Care: No (Comment) Care giver support system in place?: Yes (comment)   Criminal Activity/Legal Involvement Pertinent to Current Situation/Hospitalization: No - Comment as needed  Activities of Daily Living Home Assistive Devices/Equipment: Wheelchair ADL Screening (condition at time of admission) Patient's cognitive ability adequate to safely complete daily activities?: Yes Is the patient deaf or have difficulty hearing?: No Does the patient have difficulty seeing, even when wearing glasses/contacts?: No Does the patient have difficulty concentrating, remembering, or making decisions?: No Patient able to express need for assistance with ADLs?: Yes Does the patient have difficulty dressing or bathing?: No Independently performs  ADLs?: No Communication: Independent Dressing (OT): Independent Grooming: Independent Feeding: Independent Bathing: Needs assistance Is this a change from baseline?: Pre-admission baseline Toileting: Needs assistance Is this a change from baseline?: Pre-admission baseline In/Out Bed: Needs assistance Is this a change from baseline?: Pre-admission baseline Walks in Home: Independent with device (comment) Is this a change from baseline?: Pre-admission baseline Does the patient have difficulty walking or climbing stairs?: Yes Weakness of Legs: Both Weakness of Arms/Hands: Both  Permission Sought/Granted                  Emotional Assessment Appearance:: Appears stated age     Orientation: : Oriented to Self, Oriented to Place, Oriented to Situation Alcohol / Substance Use: Not Applicable Psych Involvement: No (comment)  Admission diagnosis:  Anxiety [F41.9] Noncompliance with medications [Z91.14] Diabetic ketoacidosis without coma associated with type 1 diabetes mellitus (HCC) [E10.10] Patient Active Problem List   Diagnosis Date Noted  . DKA, type 1 (HCC) 10/17/2018  . Bipolar disorder (HCC)   . Hypertension   . Hypercholesteremia   . DKA (diabetic ketoacidoses) (HCC) 09/17/2018   PCP:  Gennette Pac, NP Pharmacy:   [facility] - [place], [place] - [address] [place] [address] Phone: [phone] Fax: [phone]     Social Determinants of Health (SDOH) Interventions    Readmission Risk Interventions Readmission Risk Prevention Plan 10/03/2018  Transportation Screening Patient refused  PCP or Specialist Appt within 5-7 Days Patient refused  Home Care Screening  Patient refused  Medication Review (RN CM) Patient Refused  Some recent data might be hidden

## 2018-10-27 NOTE — [place] FL2 (Signed)
[place] MEDICAID FL2 LEVEL OF CARE SCREENING TOOL     IDENTIFICATION  Patient Name: Omar Wood Birthdate: [birth year] Sex: male Admission Date (Current Location): 10/26/2018  [place] and [place] Number:  Aaron Edelman [id number] P Facility and Address:  [facility],  [address] [place] [address]      Provider Number: [id number]  Attending Physician Name and Address:  Coralie Keens  Relative Name and Phone Number:       Current Level of Care: Hospital Recommended Level of Care: Skilled Nursing Facility Prior Approval Number:    Date Approved/Denied:   PASRR Number: [id number]  Discharge Plan: SNF    Current Diagnoses: Patient Active Problem List   Diagnosis Date Noted  . DKA, type 1 (HCC) 10/17/2018  . Bipolar disorder (HCC)   . Hypertension   . Hypercholesteremia   . DKA (diabetic ketoacidoses) (HCC) 09/17/2018    Orientation RESPIRATION BLADDER Height & Weight     Self, Time, Situation, Place    Continent Weight: 139 lb 5.3 oz (63.2 kg) Height:  5\' 9"  (175.3 cm)  BEHAVIORAL SYMPTOMS/MOOD NEUROLOGICAL BOWEL NUTRITION STATUS      Continent Diet(see dc summary)  AMBULATORY STATUS COMMUNICATION OF NEEDS Skin   Extensive Assist Verbally Normal                       Personal Care Assistance Level of Assistance    Bathing Assistance: Independent Feeding assistance: Independent Dressing Assistance: Independent     Functional Limitations Info    Sight Info: Adequate Hearing Info: Adequate Speech Info: Adequate    SPECIAL CARE FACTORS FREQUENCY  PT (By licensed PT)     PT Frequency: 5 times week              Contractures Contractures Info: Not present    Additional Factors Info  Insulin Sliding Scale Code Status Info: full Allergies Info: NKA Psychotropic Info: Cymbalta, Seroquel, Desyrel Insulin Sliding Scale Info: see dc summary       Current Medications (10/27/2018):  This is the current hospital active  medication list Current Facility-Administered Medications  Medication Dose Route Frequency Provider Last Rate Last Dose  . acetaminophen (TYLENOL) tablet 650 mg  650 mg Oral Q6H PRN Emokpae, Ejiroghene E, MD       Or  . acetaminophen (TYLENOL) suppository 650 mg  650 mg Rectal Q6H PRN Emokpae, Ejiroghene E, MD      . amLODipine (NORVASC) tablet 5 mg  5 mg Oral Daily Emokpae, Ejiroghene E, MD   5 mg at 10/27/18 0920  . atorvastatin (LIPITOR) tablet 20 mg  20 mg Oral Daily Emokpae, Ejiroghene E, MD   20 mg at 10/27/18 0920  . cephALEXin (KEFLEX) capsule 500 mg  500 mg Oral Q6H Arrien, York Ram, MD   500 mg at 10/27/18 1125  . Chlorhexidine Gluconate Cloth 2 % PADS 6 each  6 each Topical Q0600 Emokpae, Ejiroghene E, MD   6 each at 10/27/18 0514  . DULoxetine (CYMBALTA) DR capsule 60 mg  60 mg Oral BID Emokpae, Ejiroghene E, MD   60 mg at 10/27/18 0920  . enoxaparin (LOVENOX) injection 40 mg  40 mg Subcutaneous Q24H Emokpae, Ejiroghene E, MD   40 mg at 10/26/18 1957  . fluconazole (DIFLUCAN) tablet 200 mg  200 mg Oral Daily Emokpae, Ejiroghene E, MD   200 mg at 10/27/18 0920  . gabapentin (NEURONTIN) capsule 100 mg  100 mg Oral TID Onnie Boer, MD  100 mg at 10/27/18 0920  . insulin aspart (novoLOG) injection 0-9 Units  0-9 Units Subcutaneous TID WC Arrien, York Ram, MD   3 Units at 10/27/18 1124  . insulin glargine (LANTUS) injection 20 Units  20 Units Subcutaneous BID Coralie Keens, MD   20 Units at 10/27/18 0920  . insulin regular, human (MYXREDLIN) 100 units/ 100 mL infusion   Intravenous Continuous Emokpae, Ejiroghene E, MD   Stopped at 10/27/18 1120  . lactated ringers infusion   Intravenous Continuous Coralie Keens, MD 75 mL/hr at 10/27/18 [phone]    . ondansetron (ZOFRAN) tablet 4 mg  4 mg Oral Q6H PRN Emokpae, Ejiroghene E, MD       Or  . ondansetron (ZOFRAN) injection 4 mg  4 mg Intravenous Q6H PRN Emokpae, Ejiroghene E, MD      . pantoprazole  (PROTONIX) EC tablet 40 mg  40 mg Oral Daily Emokpae, Ejiroghene E, MD   40 mg at 10/27/18 0921  . polyethylene glycol (MIRALAX / GLYCOLAX) packet 17 g  17 g Oral Daily PRN Emokpae, Ejiroghene E, MD      . propranolol (INDERAL) tablet 10 mg  10 mg Oral TID Emokpae, Ejiroghene E, MD   10 mg at 10/27/18 0920  . QUEtiapine (SEROQUEL) tablet 300 mg  300 mg Oral QHS Emokpae, Ejiroghene E, MD   300 mg at 10/26/18 2104  . traZODone (DESYREL) tablet 50 mg  50 mg Oral QHS Emokpae, Ejiroghene E, MD   50 mg at 10/26/18 2105     Discharge Medications: Please see discharge summary for a list of discharge medications.  Relevant Imaging Results:  Relevant Lab Results:   Additional Information SSN: [id number]  Elliot Gault, LCSW

## 2018-10-28 DIAGNOSIS — N1330 Unspecified hydronephrosis: Secondary | ICD-10-CM

## 2018-10-28 DIAGNOSIS — I10 Essential (primary) hypertension: Secondary | ICD-10-CM

## 2018-10-28 DIAGNOSIS — E1110 Type 2 diabetes mellitus with ketoacidosis without coma: Secondary | ICD-10-CM

## 2018-10-28 LAB — GLUCOSE, CAPILLARY
Glucose-Capillary: 127 mg/dL — ABNORMAL HIGH (ref 70–99)
Glucose-Capillary: 186 mg/dL — ABNORMAL HIGH (ref 70–99)

## 2018-10-28 LAB — BASIC METABOLIC PANEL
Anion gap: 7 (ref 5–15)
BUN: 9 mg/dL (ref 6–20)
CO2: 21 mmol/L — ABNORMAL LOW (ref 22–32)
Calcium: 8.9 mg/dL (ref 8.9–10.3)
Chloride: 112 mmol/L — ABNORMAL HIGH (ref 98–111)
Creatinine, Ser: 0.64 mg/dL (ref 0.61–1.24)
GFR calc Af Amer: >60 mL/min (ref >60)
GFR calc non Af Amer: >60 mL/min (ref >60)
Glucose, Bld: 95 mg/dL (ref 70–99)
Potassium: 3.3 mmol/L — ABNORMAL LOW (ref 3.5–5.1)
Sodium: 140 mmol/L (ref 135–145)

## 2018-10-28 LAB — URINE CULTURE: Culture: NO GROWTH

## 2018-10-28 MED ORDER — TAMSULOSIN HCL 0.4 MG PO CAPS: 0.4000 mg | ORAL_CAPSULE | Freq: Every day | ORAL | Status: DC

## 2018-10-28 MED ORDER — INSULIN ASPART 100 UNIT/ML ~~LOC~~ SOLN
3.0000 [IU] | Freq: Three times a day (TID) | SUBCUTANEOUS | Status: DC
  Administered 2018-10-28: 12:00:00 3 [IU] via SUBCUTANEOUS

## 2018-10-28 MED ORDER — CEPHALEXIN 500 MG PO CAPS: 500.0000 mg | ORAL_CAPSULE | Freq: Four times a day (QID) | ORAL | 0 refills | Status: AC

## 2018-10-28 MED ORDER — TAMSULOSIN HCL 0.4 MG PO CAPS: 0.4000 mg | ORAL_CAPSULE | Freq: Every day | ORAL | 0 refills | Status: AC

## 2018-10-28 MED ORDER — INSULIN GLARGINE 100 UNIT/ML ~~LOC~~ SOLN
40.0000 [IU] | Freq: Every day | SUBCUTANEOUS | Status: DC
  Administered 2018-10-28: 10:00:00 40 [IU] via SUBCUTANEOUS
  Filled 2018-10-28 (×2): qty 0.4

## 2018-10-28 MED ORDER — POTASSIUM CHLORIDE 20 MEQ PO PACK
40.0000 meq | PACK | ORAL | Status: AC
  Administered 2018-10-28 (×2): 40 meq via ORAL
  Filled 2018-10-28 (×2): qty 2

## 2018-10-28 NOTE — Discharge Summary (Addendum)
Physician Discharge Summary  Omar Wood UJW:[id number] DOB: 1961/01/15 DOA: 10/26/2018  PCP: Gennette Pac, NP  Admit date: 10/26/2018 Discharge date: 10/28/2018  Admitted From: SNF  Disposition:  Home   Recommendations for Outpatient Follow-up and new medication changes:  1. Follow up Antony Odea in 7 days.  2. Patient will resume is insulin regimen. 3. Started on Tamsulosin for signs of bladder outlet obstruction.    Home Health: yes  Equipment/Devices: wheelchair.    Discharge Condition: stable CODE STATUS: full  Diet recommendation: heart healthy and diabetic prudent.   Brief/Interim Summary: 58 year old male who presented with hyperglycemia.  He does have the significant past medical history for type 2 diabetes mellitus and hypertension.  The patient was refusing his medications.  He became agitated and combative.  Patient is a resident from a local nursing home.  On his initial physical examination blood pressure 172/97, heart rate 136, respiratory rate 25, oxygen saturation 99%, he had dry mucous membranes, heart S1-S2 present, tachycardic his lungs were clear to auscultation bilaterally, his abdomen was soft nontender, no lower extremity edema.  A venous blood gas had a pH of 7.0, serum sodium 130, potassium 5.5, chloride 97, bicarb 9, glucose 566, BUN 19, creatinine 1.13, anion gap 24, magnesium 1.8, troponin less than 0.03, white count 10.5, hemoglobin 12.0, hematocrit 38.4, platelets 351. Urine analysis had greater than 500 glucose, wbc > 50. Urine drug screen negative.  His chest x-ray had hyperinflation, no infiltrates, EKG, 130 bpm, normal intervals, normal axis, sinus rhythm, no ST segment or T wave changes.  Patient was admitted to the hospital with a working diagnosis of diabetes ketoacidosis complicated by pseudohyponatremia, acute kidney injury and urinary tract infection  1.  Type 2 diabetes mellitus with diabetes ketoacidosis.  Patient was admitted to the stepdown unit,  he received IV insulin, his serum glucose improved, his anion gap closed, his diet was advanced with good toleration and he was successfully transitioned to subcutaneous insulin.  His fasting glucose was 95, patient will continue taking 40 units of insulin glargine daily along with 3 units of pre-meal insulin with aspart and insulin sliding scale.  2.  Acute kidney injury with hypokalemia.  Patient received IV fluids, with improvement of his renal function. His discharge creatinine 0.64, potassium was corrected with potassium chloride.  3.  Complicated urinary tract infection with moderate chronic bilateral hydronephrosis. Patient did complain of urinary symptoms, his urine culture has been no growth, he received antibiotic therapy with cephalexin, that he will continue for 3 more days.  Further work-up with renal ultrasonography showed moderate bilateral hydronephrosis similar to prior CT imaging (06/2018), mild bladder wall thickening and bladder debris.  No renal masses or stones.  Possible bladder outlet obstruction, will add empiric Tamsulosin, follow-up as an outpatient.  4.  Candidal esophagitis.  Continue fluconazole.  5.  Hypertension.  Continue blood pressure control with amlodipine, and propanolol.  6.  Bipolar disorder.  No confusion or agitation, continue quetiapine, duloxetine and trazodone.  Discharge Diagnoses:  Active Problems:   DKA (diabetic ketoacidoses) (HCC)   Bipolar disorder (HCC)   Hypertension   Hypercholesteremia   Hydronephrosis   Essential (primary) hypertension   DM (diabetes mellitus) type 2, uncontrolled, with ketoacidosis (HCC)    Discharge Instructions   Allergies as of 10/28/2018   No Known Allergies     Medication List    STOP taking these medications   Levemir FlexTouch 100 UNIT/ML Pen Generic drug:  Insulin Detemir  TAKE these medications   acetaminophen 325 MG tablet Commonly known as:  TYLENOL Take 650 mg by mouth every 6 (six) hours  as needed for mild pain or moderate pain.   alum & mag hydroxide-simeth 200-200-20 MG/5ML suspension Commonly known as:  MAALOX/MYLANTA Take 30 mLs by mouth every 6 (six) hours as needed for indigestion or heartburn.   amLODipine 5 MG tablet Commonly known as:  NORVASC Take 1 tablet (5 mg total) by mouth daily.   atorvastatin 20 MG tablet Commonly known as:  LIPITOR Take 20 mg by mouth daily.   Basaglar KwikPen 100 UNIT/ML Sopn Inject 0.4 mLs (40 Units total) into the skin at bedtime. What changed:  how much to take   cephALEXin 500 MG capsule Commonly known as:  KEFLEX Take 1 capsule (500 mg total) by mouth every 6 (six) hours for 3 days.   DULoxetine 60 MG capsule Commonly known as:  CYMBALTA Take 60 mg by mouth 2 (two) times daily.   esomeprazole 40 MG capsule Commonly known as:  NEXIUM Take 1 capsule (40 mg total) by mouth 2 (two) times daily before a meal.   fluconazole 200 MG tablet Commonly known as:  DIFLUCAN Take 1 tablet (200 mg total) by mouth daily for 10 days. What changed:  additional instructions   gabapentin 100 MG capsule Commonly known as:  NEURONTIN Take 1 capsule (100 mg total) by mouth 3 (three) times daily.   insulin lispro 100 UNIT/ML injection Commonly known as:  HUMALOG Inject 0.05 mLs (5 Units total) into the skin 3 (three) times daily before meals.   Melatonin 3 MG Tabs Take 3 mg by mouth at bedtime.   metoCLOPramide 5 MG tablet Commonly known as:  Reglan Take 1 tablet (5 mg total) by mouth 3 (three) times daily.   mirtazapine 7.5 MG tablet Commonly known as:  REMERON Take 7.5 mg by mouth at bedtime.   nystatin cream Commonly known as:  MYCOSTATIN Apply topically 2 (two) times daily.   ondansetron 4 MG disintegrating tablet Commonly known as:  ZOFRAN-ODT Take 1 tablet (4 mg total) by mouth every 6 (six) hours as needed for nausea or vomiting.   polyethylene glycol powder powder Commonly known as:  GLYCOLAX/MIRALAX Take 17 g by  mouth 2 (two) times daily.   propranolol 10 MG tablet Commonly known as:  INDERAL Take 10 mg by mouth 3 (three) times daily.   QUEtiapine 400 MG tablet Commonly known as:  SEROQUEL Take 400 mg by mouth at bedtime.   tamsulosin 0.4 MG Caps capsule Commonly known as:  FLOMAX Take 1 capsule (0.4 mg total) by mouth daily after supper for 30 days.   traZODone 50 MG tablet Commonly known as:  DESYREL Take 50 mg by mouth at bedtime.       No Known Allergies  Consultations:     Procedures/Studies: Dg Chest 2 View  Result Date: 09/28/2018 CLINICAL DATA:  Hypoxia EXAM: CHEST - 2 VIEW COMPARISON:  09/24/2018 chest radiograph. FINDINGS: Stable cardiomediastinal silhouette with normal heart size. No pneumothorax. Small bilateral pleural effusions. Extensive patchy opacity throughout both lungs is mildly improved bilaterally. IMPRESSION: 1. Mildly improved extensive patchy opacity throughout both lungs, suggesting improving multifocal pneumonia and/or pulmonary edema. 2. Small bilateral pleural effusions. Electronically Signed   By: Delbert Phenix M.D.   On: 09/28/2018 16:27   Dg Ribs Unilateral Right  Result Date: 09/29/2018 CLINICAL DATA:  Right rib pain. EXAM: RIGHT RIBS - 2 VIEW COMPARISON:  Radiographs of September 28, 2018. FINDINGS: No fracture or other bone lesions are seen involving the ribs. IMPRESSION: Negative. Electronically Signed   By: Lupita RaiderJames  Green Jr, M.D.   On: 09/29/2018 17:36   Ct Chest Wo Contrast  Result Date: 09/30/2018 CLINICAL DATA:  Chronic dyspnea. EXAM: CT CHEST WITHOUT CONTRAST TECHNIQUE: Multidetector CT imaging of the chest was performed following the standard protocol without IV contrast. COMPARISON:  02/18/2018 FINDINGS: Cardiovascular: Evaluation of vascular structures is limited without IV contrast material. Heart size is normal. No pericardial effusions. Normal caliber thoracic aorta. Mediastinum/Nodes: Scattered mediastinal lymph nodes are not pathologically  enlarged, likely reactive. Esophagus is decompressed. Suggestion of wall thickening in the distal esophagus as seen on prior study. This could be due to reflux disease or a mass lesion, as suggested previously. No evidence of proximal obstruction. Lungs/Pleura: Patchy airspace and interstitial infiltrates demonstrated throughout both lungs, new since prior CT. Small bilateral pleural effusions. Changes likely to represent multifocal pneumonia. No pneumothorax. Airways are patent. Upper Abdomen: No acute abnormalities. Musculoskeletal: No chest wall mass or suspicious bone lesions identified. Old right rib fractures. IMPRESSION: 1. Patchy airspace and interstitial infiltrates throughout both lungs, new since prior study. Small bilateral pleural effusions. Changes likely to represent multifocal pneumonia. 2. Suggestion of wall thickening in the distal esophagus. This could be due to reflux disease or mass lesion. Suggest correlation with endoscopy, as was also suggested in the previous study. Electronically Signed   By: Burman NievesWilliam  Stevens M.D.   On: 09/30/2018 22:05   [place] Renal  Result Date: 10/27/2018 CLINICAL DATA:  Urinary tract infection. EXAM: RENAL / URINARY TRACT ULTRASOUND COMPLETE COMPARISON:  CT, 07/06/2018 FINDINGS: Right Kidney: Renal measurements: 12.5 x 5.8 x 6.4 cm = volume: 244 mL. Moderate hydronephrosis. Normal parenchymal echogenicity with no masses or stones. Left Kidney: Renal measurements: 14.3 x 6.3 x 7.5 cm = volume: 351 mL. Moderate hydronephrosis. Normal parenchymal echogenicity. No masses or stones. Bladder: Bladder distended. Debris within the bladder. Bladder moderately distended. Mild wall thickening. No masses or stones. IMPRESSION: 1. Moderate bilateral hydronephrosis, which is similar to the prior CT. 2. Mild bladder wall thickening and bladder debris consistent with the reported UTI. 3. No renal masses or stones. Electronically Signed   By: Amie Portlandavid  Ormond M.D.   On: 10/27/2018 11:30    Dg Chest Portable 1 View  Result Date: 10/26/2018 CLINICAL DATA:  58 year old male with a history diabetes EXAM: PORTABLE CHEST 1 VIEW COMPARISON:  10/09/2018 FINDINGS: Cardiomediastinal silhouette unchanged in size and contour. No evidence of central vascular congestion. Low lung volumes with coarsened interstitial markings, similar to the prior. No confluent airspace disease, pneumothorax, or pleural effusion. IMPRESSION: Low lung volumes without evidence of acute cardiopulmonary disease Electronically Signed   By: Gilmer MorJaime  Wagner D.O.   On: 10/26/2018 16:30   Dg Chest Port 1 View  Result Date: 09/30/2018 CLINICAL DATA:  58 year old male with recent hypoxia and multifocal pneumonia versus edema. EXAM: PORTABLE CHEST 1 VIEW COMPARISON:  Chest radiographs 09/28/2018 and earlier. FINDINGS: Portable AP semi upright view at 1541 hours. Patchy and streaky bilateral pulmonary opacity is stable since 09/28/2018 and more extensive in the left lung. No pneumothorax. No pleural effusion is evident. No areas of worsening ventilation. Stable cardiac size and mediastinal contours. Visualized tracheal air column is within normal limits. Chronic left clavicle fracture. No acute osseous abnormality identified. Negative visible bowel gas pattern IMPRESSION: Stable since 09/28/2018. Patchy and streaky bilateral pulmonary opacity. No new cardiopulmonary abnormality. Electronically Signed   By: HRexene Edison  Margo Aye M.D.   On: 09/30/2018 15:59       Subjective: Patient is feeling better, continue to have malaise, but no nausea or vomiting, tolerating po well. No chest pain or dyspnea.   Discharge Exam: Vitals:   10/28/18 0600 10/28/18 0700  BP: 140/88 118/79  Pulse: 99 99  Resp: 13 15  Temp:    SpO2: 100% 98%   Vitals:   10/28/18 0400 10/28/18 0500 10/28/18 0600 10/28/18 0700  BP: (!) 100/56 107/69 140/88 118/79  Pulse: (!) 102  99 99  Resp: Temp:  98.7 F (37.1 C)    TempSrc:  Oral    SpO2: 94%  100%  98%  Weight:  62.6 kg    Height:        General: Not in pain or dyspnea Neurology: Awake and alert, non focal  E ENT: mild pallor, no icterus, oral mucosa moist Cardiovascular: No JVD. S1-S2 present, rhythmic, no gallops, rubs, or murmurs. No lower extremity edema. Pulmonary: positive breath sounds bilaterally, adequate air movement, no wheezing, rhonchi or rales. Gastrointestinal. Abdomen with no organomegaly, non tender, no rebound or guarding Skin. No rashes Musculoskeletal: no joint deformities   The results of significant diagnostics from this hospitalization (including imaging, microbiology, ancillary and laboratory) are listed below for reference.     Microbiology: Recent Results (from the past 240 hour(s))  MRSA PCR Screening     Status: None   Collection Time: 10/26/18  6:59 PM  Result Value Ref Range Status   MRSA by PCR NEGATIVE NEGATIVE Final    Comment:        The GeneXpert MRSA Assay (FDA approved for NASAL specimens only), is one component of a comprehensive MRSA colonization surveillance program. It is not intended to diagnose MRSA infection nor to guide or monitor treatment for MRSA infections. Performed at [facility], [address], [place], [place] [address]   Culture, Urine     Status: None   Collection Time: 10/26/18  8:39 PM  Result Value Ref Range Status   Specimen Description   Final    URINE, CATHETERIZED Performed at [facility], [address] [place], [place] [address]    Special Requests   Final    NONE Performed at [facility], [address] [place], [place] [address]    Culture   Final    NO GROWTH Performed at [facility], [address] [place], [place] [address]    Report Status 10/28/2018 FINAL  Final     Labs: BNP (last 3 results) No results for input(s): BNP in the last 8760 hours. Basic Metabolic Panel: Recent Labs  Lab 10/26/18 1602 10/26/18 2325 10/27/18 0514 10/27/18 1603 10/28/18 0421  NA 130* 135  136 135 140  K 5.5* 4.0 3.9 4.0 3.3*  CL 97* 110 111 110 112*  CO2 9* 13* 15* 18* 21*  GLUCOSE 566* 165* 170* 339* 95  BUN CREATININE 1.13 0.86 0.96 0.96 0.64  CALCIUM 9.4 8.7* 8.8* 8.8* 8.9  MG 1.8  --   --   --   --    Liver Function Tests: No results for input(s): AST, ALT, ALKPHOS, BILITOT, PROT, ALBUMIN in the last 168 hours. No results for input(s): LIPASE, AMYLASE in the last 168 hours. No results for input(s): AMMONIA in the last 168 hours. CBC: Recent Labs  Lab 10/26/18 1602 10/27/18 0514  WBC 10.5 9.2  NEUTROABS 8.1*  --  HGB 12.0* 10.1*  HCT 38.4* 31.0*  MCV 92.8 90.1  PLT 351 293   Cardiac Enzymes: Recent Labs  Lab 10/26/18 1602  TROPONINI <0.03   BNP: Invalid input(s): POCBNP CBG: Recent Labs  Lab 10/27/18 1006 10/27/18 1119 10/27/18 1630 10/27/18 2214 10/28/18 0731  GLUCAP 216* 218* 343* 295* 127*   D-Dimer No results for input(s): DDIMER in the last 72 hours. Hgb A1c No results for input(s): HGBA1C in the last 72 hours. Lipid Profile No results for input(s): CHOL, HDL, LDLCALC, TRIG, CHOLHDL, LDLDIRECT in the last 72 hours. Thyroid function studies No results for input(s): TSH, T4TOTAL, T3FREE, THYROIDAB in the last 72 hours.  Invalid input(s): FREET3 Anemia work up No results for input(s): VITAMINB12, FOLATE, FERRITIN, TIBC, IRON, RETICCTPCT in the last 72 hours. Urinalysis    Component Value Date/Time   COLORURINE STRAW (A) 10/26/2018 1603   APPEARANCEUR HAZY (A) 10/26/2018 1603   LABSPEC 1.017 10/26/2018 1603   PHURINE 5.0 10/26/2018 1603   GLUCOSEU >=500 (A) 10/26/2018 1603   HGBUR MODERATE (A) 10/26/2018 1603   BILIRUBINUR NEGATIVE 10/26/2018 1603   KETONESUR 80 (A) 10/26/2018 1603   PROTEINUR NEGATIVE 10/26/2018 1603   NITRITE NEGATIVE 10/26/2018 1603   LEUKOCYTESUR SMALL (A) 10/26/2018 1603   Sepsis Labs Invalid input(s): PROCALCITONIN,  WBC,  LACTICIDVEN Microbiology Recent Results (from the past 240  hour(s))  MRSA PCR Screening     Status: None   Collection Time: 10/26/18  6:59 PM  Result Value Ref Range Status   MRSA by PCR NEGATIVE NEGATIVE Final    Comment:        The GeneXpert MRSA Assay (FDA approved for NASAL specimens only), is one component of a comprehensive MRSA colonization surveillance program. It is not intended to diagnose MRSA infection nor to guide or monitor treatment for MRSA infections. Performed at [facility], [address], [place], [place] [address]   Culture, Urine     Status: None   Collection Time: 10/26/18  8:39 PM  Result Value Ref Range Status   Specimen Description   Final    URINE, CATHETERIZED Performed at [facility], [address] [place], [place] [address]    Special Requests   Final    NONE Performed at [facility], [address], [place], [place] [address]    Culture   Final    NO GROWTH Performed at [facility], [address], [place], [place] [address]    Report Status 10/28/2018 FINAL  Final     Time coordinating discharge: 45 minutes  SIGNED:   Coralie Keens, MD  [facility] 10/28/2018, 8:11 AM

## 2018-10-28 NOTE — TOC Transition Note (Addendum)
Transition of Care [facility]) - CM/SW Discharge Note   Patient Details  Name: Omar Wood MRN: [id number] Date of Birth: 06/11/1961  Transition of Care [facility]) CM/SW Contact:  Shade Flood, LCSW Phone Number: 10/28/2018, 11:04 AM   Clinical Narrative:    PT recommending home with [facility] PT and wheelchair if pt's sister can assist him at home otherwise pt would need SNF. Updated MD. Pt has told PT he prefers to dc home. HH RN will also be ordered by MD to help pt manage his diabetes. Met with pt to discuss. Pt very agreeable. Choices offered. Will make referrals as requested. Per pt, his sister will pick him up and assist as needed at home.There are no other TOC needs for dc.   Final next level of care: [place] Barriers to Discharge: No Barriers Identified   Patient Goals and CMS Choice Patient states their goals for this hospitalization and ongoing recovery are:: home with [facility] CMS Medicare.gov Compare Post Acute Care list provided to:: Patient Choice offered to / list presented to : Patient  Discharge Placement                       Discharge Plan and Services In-house Referral: Clinical Social Work              DME Arranged: Programmer, multimedia DME Agency: [employer] HH Arranged: Therapist, sports, PT [place] Agency: [facility]   Social Determinants of Health (SDOH) Interventions     Readmission Risk Interventions Readmission Risk Prevention Plan 10/03/2018  Transportation Screening Patient refused  PCP or Specialist Appt within 5-7 Days Patient refused  Home Care Screening Patient refused  Medication Review (RN CM) Patient Refused  Some recent data might be hidden

## 2018-10-28 NOTE — Progress Notes (Signed)
Patient alert and oriented x4. No complaints of pain, shortness of breath, chest pain, dizziness, nausea and vomiting. Patient up out of bed to wheelchair with supervision. Patient tolerated meds and diet well. Appetite good. IV's removed without complications. Discharge instructions, follow up appointment information and medication education done with patient. Patient expressed full understanding of instructions, appointment information and education. Patient discharged with all belongings for home via car (sister is driving patient home).

## 2018-10-28 NOTE — Plan of Care (Signed)
  Problem: Acute Rehab PT Goals(only PT should resolve) Goal: Pt Will Go Supine/Side To Sit Outcome: Progressing Flowsheets (Taken 10/28/2018 1150) Pt will go Supine/Side to Sit: Independently Goal: Patient Will Transfer Sit To/From Stand Outcome: Progressing Flowsheets (Taken 10/28/2018 1150) Patient will transfer sit to/from stand: with modified independence Goal: Pt Will Transfer Bed To Chair/Chair To Bed Outcome: Progressing Flowsheets (Taken 10/28/2018 1150) Pt will Transfer Bed to Chair/Chair to Bed: with modified independence Goal: Pt Will Ambulate Outcome: Progressing Flowsheets (Taken 10/28/2018 1150) Pt will Ambulate: 25 feet; with min guard assist; with rolling Hession   11:50 AM, 10/28/18 [name], MPT Physical Therapist with [facility] [phone] office [phone] mobile phone

## 2018-10-28 NOTE — Progress Notes (Addendum)
Patient is very adamant about not going back to skilled nursing facility.  For him it does affect his quality of life, and he feels comfortable going home.  The patient demonstrates clear understanding of the benefits and risk of going home, alternatives options were discussed in detail including different SNF or calling the SNF to accommodate his preferences like a different room. Patient has decided to decline to be released to SNF, and is aware of the possible outcomes including worsening condition, recurrent DKA  and death.   He demonstrates proficiency and managing his insulin therapy, glucometer and follow-up on capillary glucose.    All questions were addresses and his care giver was notified.

## 2018-10-28 NOTE — Evaluation (Signed)
Physical Therapy Evaluation Patient Details Name: Omar Wood MRN: [id number] DOB: 27-Sep-1960 Today's Date: 10/28/2018   History of Present Illness  Omar Wood is a 58 y.o. male with medical history significant for DM, HTN, was brought to Ed from [facility] with reports of high blood sugar, reportedly patient was refusing his medications. He said the sliding scale does not work for him. Patient also agitated in ed and combative.     Clinical Impression  Patient limited to a few steps at bedside due mostly to c/o fatigue and chronic BLE pain, no loss of balance for taking steps at bedside and tolerated sitting up in wheelchair after therapy - RN notified.  Patient will benefit from continued physical therapy in hospital and recommended venue below to increase strength, balance, endurance for safe ADLs and gait.  Patient states his sister will be able to take care of him and feels comfortable going home.  Patient suffers from diabetes mellitus with c/o chronic BLE diabetic pain which impairs his ability to perform daily activities like walking, prolonged standing and doing ADLs' in the home.  A [place] alone will not resolve the issues with performing activities of daily living. A wheelchair will allow patient to safely perform daily activities.  The patient can self propel in the home and has a caregiver who can provide assistance.  Patient will benefit from use of wheelchair for household and community use.    Follow Up Recommendations SNF;Supervision - Intermittent;Supervision for mobility/OOB    Equipment Recommendations  Wheelchair cushion (measurements PT);Wheelchair (measurements PT)    Recommendations for Other Services       Precautions / Restrictions Precautions Precautions: Fall Restrictions Weight Bearing Restrictions: No      Mobility  Bed Mobility Overal bed mobility: Modified Independent             General bed mobility comments: slightly increased time, head of  bed raised  Transfers Overall transfer level: Needs assistance Equipment used: 1 person hand held assist Transfers: Sit to/from Stand;Stand Pivot Transfers Sit to Stand: Supervision Stand pivot transfers: Supervision       General transfer comment: increased time, labored movement  Ambulation/Gait Ambulation/Gait assistance: Min guard;Supervision Gait Distance (Feet): 4 Feet Assistive device: 1 person hand held assist Gait Pattern/deviations: Decreased step length - right;Decreased step length - left;Decreased stride length Gait velocity: decreased   General Gait Details: limited to 5--6 steps at bedside without loss of balance during transfer to wheelchair  Stairs            Wheelchair Mobility    Modified Rankin (Stroke Patients Only)       Balance Overall balance assessment: Needs assistance Sitting-balance support: Feet supported;No upper extremity supported Sitting balance-Leahy Scale: Good     Standing balance support: No upper extremity supported;During functional activity Standing balance-Leahy Scale: Fair Standing balance comment: without AD                             Pertinent Vitals/Pain Pain Assessment: Faces Faces Pain Scale: Hurts little more Pain Location: BLE Pain Descriptors / Indicators: Aching Pain Intervention(s): Limited activity within patient's tolerance;Monitored during session    Home Living Family/patient expects to be discharged to:: Private residence Living Arrangements: Other relatives Available Help at Discharge: Family;Available PRN/intermittently Type of Home: House Home Access: Stairs to enter Entrance Stairs-Rails: None Entrance Stairs-Number of Steps: 6 Home Layout: One level Home Equipment: [place] - 2 wheels;Cane - single  point      Prior Function Level of Independence: Independent with assistive device(s)         Comments[facility], drives prior to last admission, since was at  nursing home for rehab     Hand Dominance        Extremity/Trunk Assessment   Upper Extremity Assessment Upper Extremity Assessment: Overall WFL for tasks assessed         Cervical / Trunk Assessment Cervical / Trunk Assessment: Normal  Communication   Communication: No difficulties  Cognition Arousal/Alertness: Awake/alert Behavior During Therapy: WFL for tasks assessed/performed Overall Cognitive Status: Within Functional Limits for tasks assessed                                 General Comments: cooperative due to motivation to go home      General Comments      Exercises     Assessment/Plan    PT Assessment Patient needs continued PT services  PT Problem List Decreased strength;Decreased activity tolerance;Decreased balance;Decreased mobility       PT Treatment Interventions Functional mobility training;Patient/family education;Gait training;Therapeutic activities;Therapeutic exercise;DME instruction;Stair training    PT Goals (Current goals can be found in the Care Plan section)  Acute Rehab PT Goals Patient Stated Goal: return home with family to assist PT Goal Formulation: With patient Time For Goal Achievement: 11/11/18 Potential to Achieve Goals: Good    Frequency Min 3X/week   Barriers to discharge        Co-evaluation               AM-PAC PT "6 Clicks" Mobility  Outcome Measure Help needed turning from your back to your side while in a flat bed without using bedrails?: None Help needed moving from lying on your back to sitting on the side of a flat bed without using bedrails?: None Help needed moving to and from a bed to a chair (including a wheelchair)?: A Little Help needed standing up from a chair using your arms (e.g., wheelchair or bedside chair)?: A Little Help needed to walk in hospital room?: A Lot Help needed climbing 3-5 steps with a railing? : A Lot 6 Click Score: 18    End of Session   Activity Tolerance:  Patient tolerated treatment well;Patient limited by fatigue Patient left: in chair;with call bell/phone within reach Nurse Communication: Mobility status PT Visit Diagnosis: Unsteadiness on feet (R26.81);Other abnormalities of gait and mobility (R26.89);Muscle weakness (generalized) (M62.81)    Time: 1004-1030 PT Time Calculation (min) (ACUTE ONLY): 26 min   Charges:   PT Evaluation $PT Eval Moderate Complexity: 1 Mod PT Treatments $Therapeutic Activity: 23-37 mins        11:45 AM, 10/28/18 Ocie BobJames Kearstyn Avitia, MPT Physical Therapist [facility] 336 [phone] office [phone] mobile phone

## 2021-01-17 IMAGING — DX DG CHEST 1V PORT
1 series · 1 of 1 positions shown · non-contrast
Comparison: Chest x-ray from earlier same day.

CLINICAL DATA: Endotracheal tube placement

EXAM:
PORTABLE CHEST 1 VIEW

[chest]
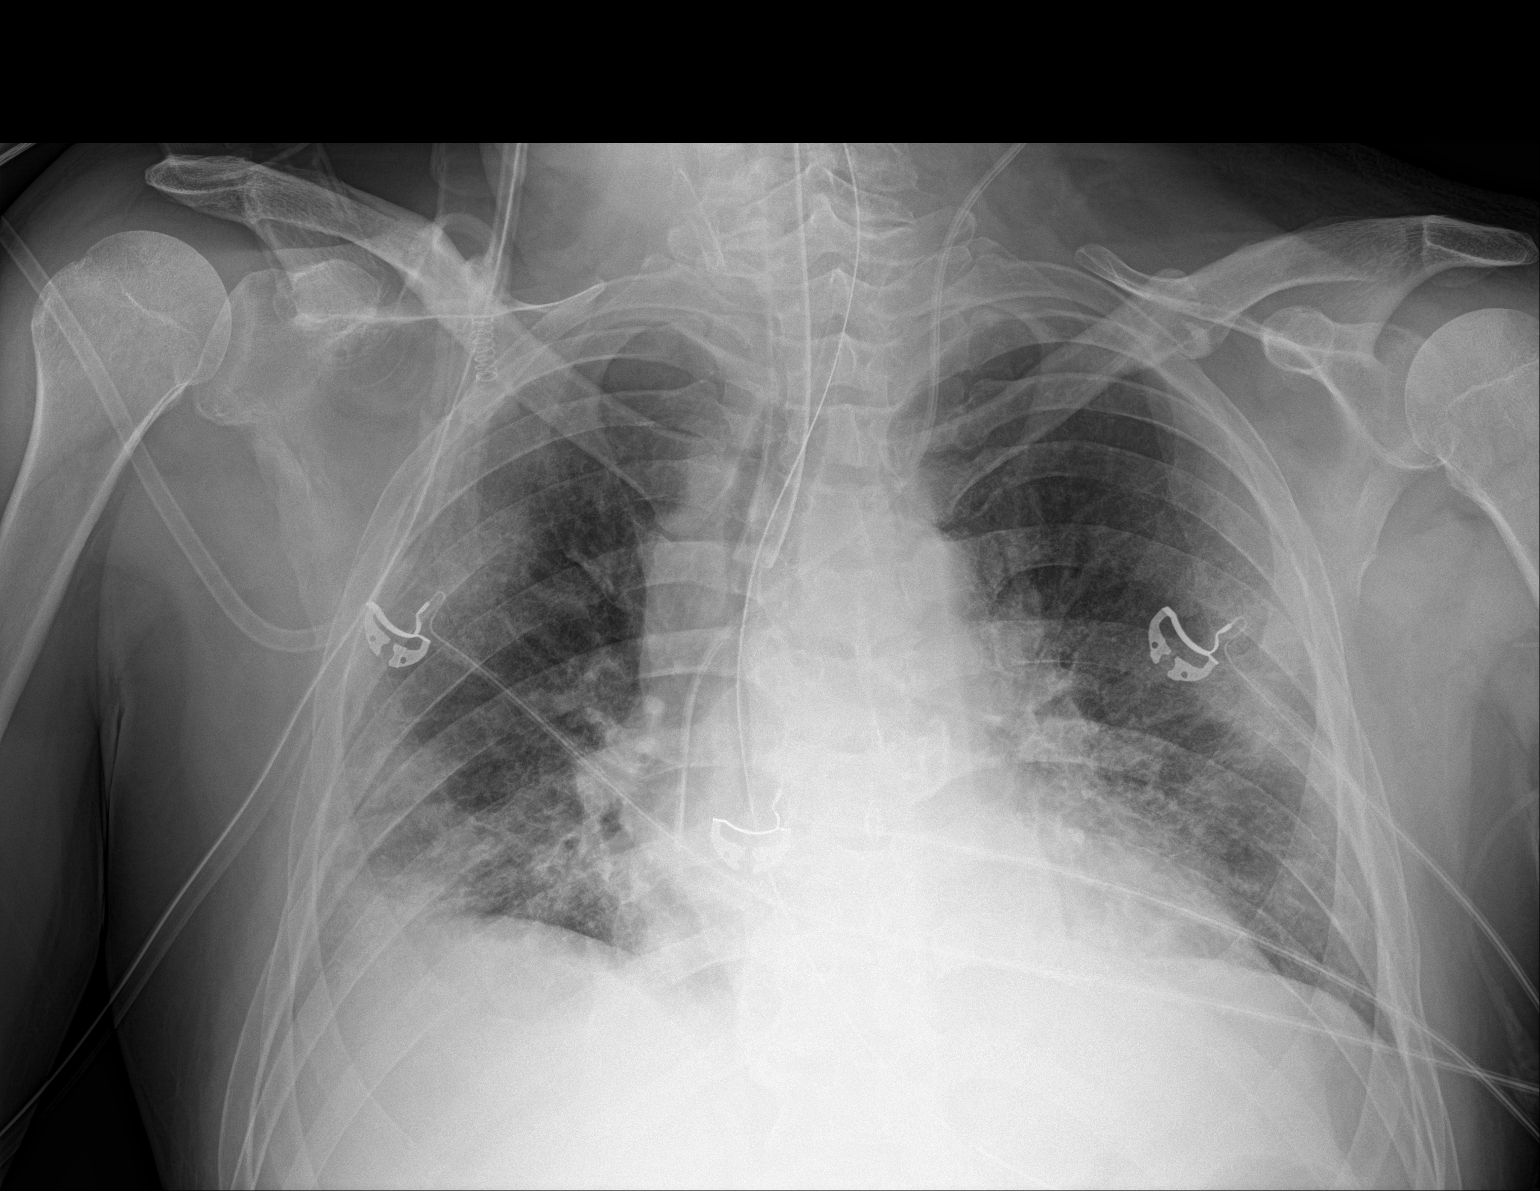

[1 of 1 positions shown; findings below may reference images not displayed]

FINDINGS: Endotracheal tube is well positioned with tip approximately 2 cm
above the carina. Enteric tube passes below the diaphragm.

There is a new LEFT IJ central line with tip well-positioned at the
expected level of the cavoatrial junction. No pneumothorax seen.

Borderline cardiomegaly, stable. Hazy perihilar and bibasilar
opacities are unchanged in the short-term interval, presumably mild
pulmonary edema. Probable small RIGHT pleural effusion and/or
atelectasis.
IMPRESSION: 1. New LEFT IJ central line with tip well-positioned at the expected
level of the cavoatrial junction. No pneumothorax seen.
2. Endotracheal tube well positioned with tip approximately 2 cm
above the carina.
3. Persistent perihilar and bibasilar opacities, presumably mild
pulmonary edema. Probable small RIGHT pleural effusion and/or
atelectasis.

## 2021-01-17 IMAGING — DX DG ABD PORTABLE 1V
1 series · 2 of 2 positions shown · non-contrast
Comparison: Plain film of the abdomen dated 09/08/2018.

CLINICAL DATA: Possible foreign body; r/o metallic fragments after
KASPER catheter was pulled out after guidewire left in at outside
hospital.

EXAM:
PORTABLE ABDOMEN - 1 VIEW

[Series 1: femur · 0.14mm/px · 2 of 2 slices shown]
[im 1/2]
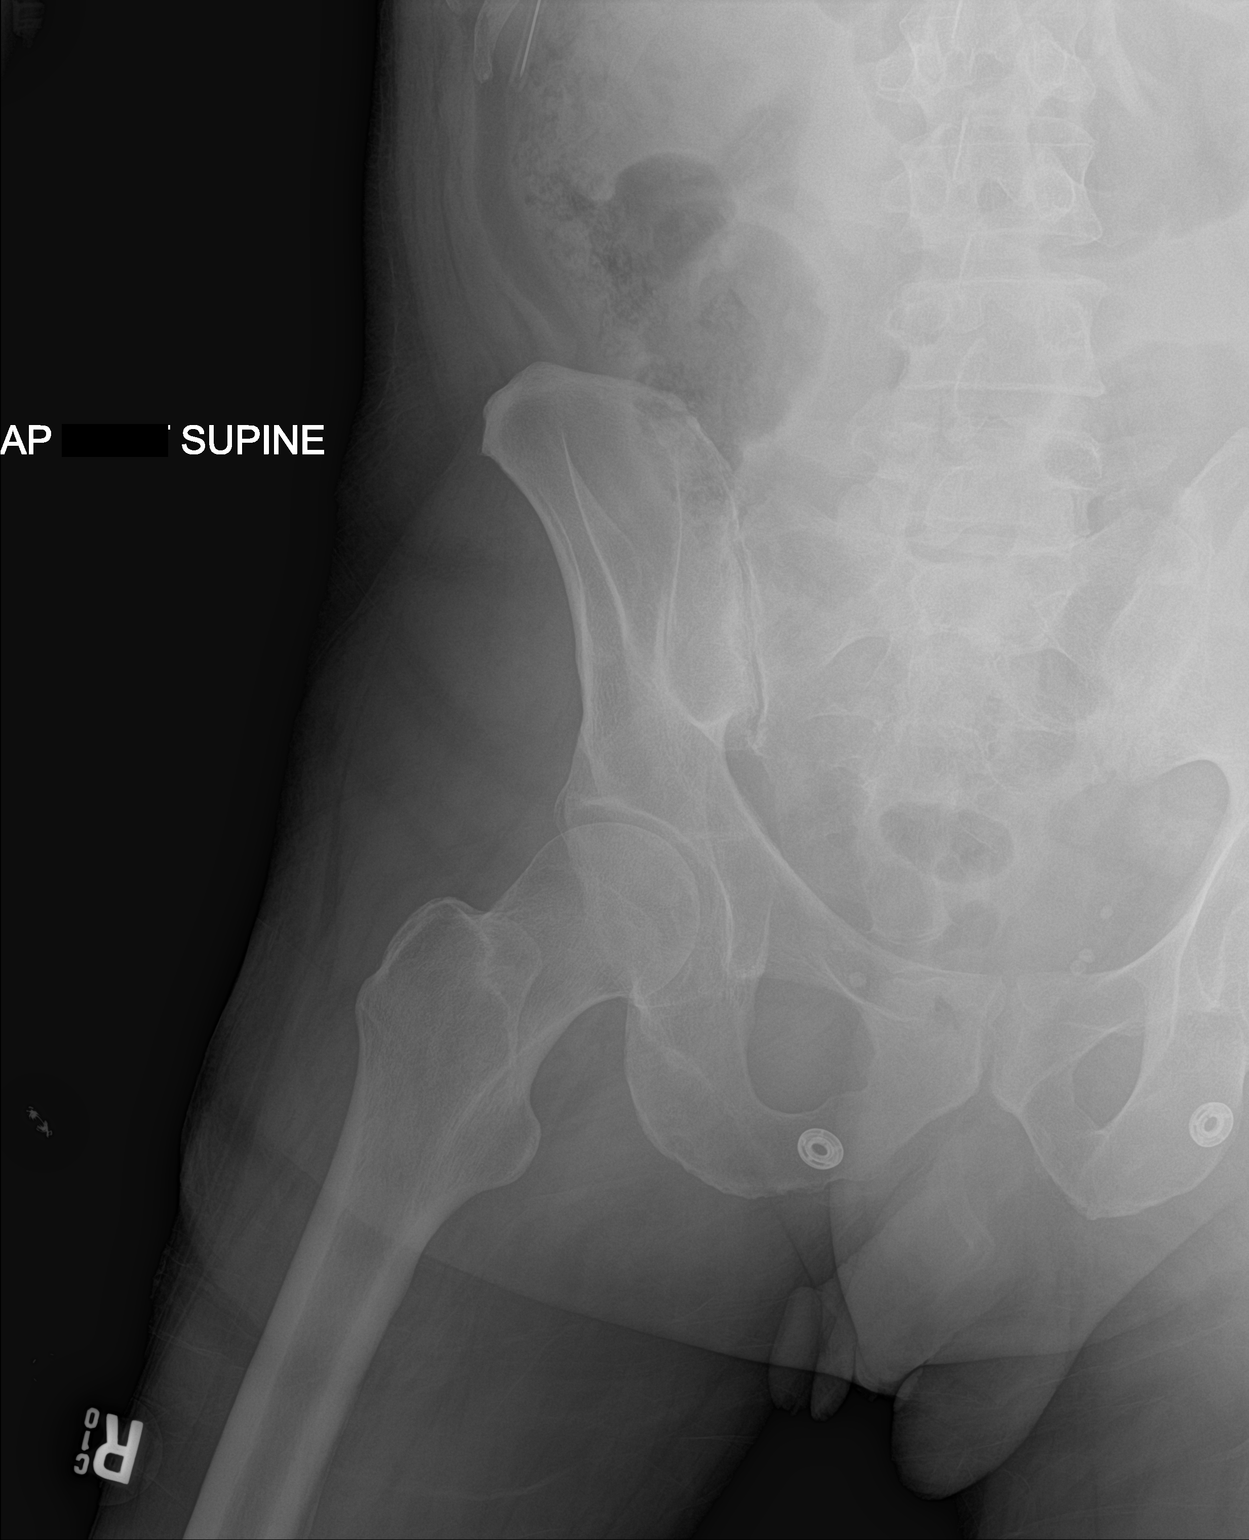
[im 2/2]
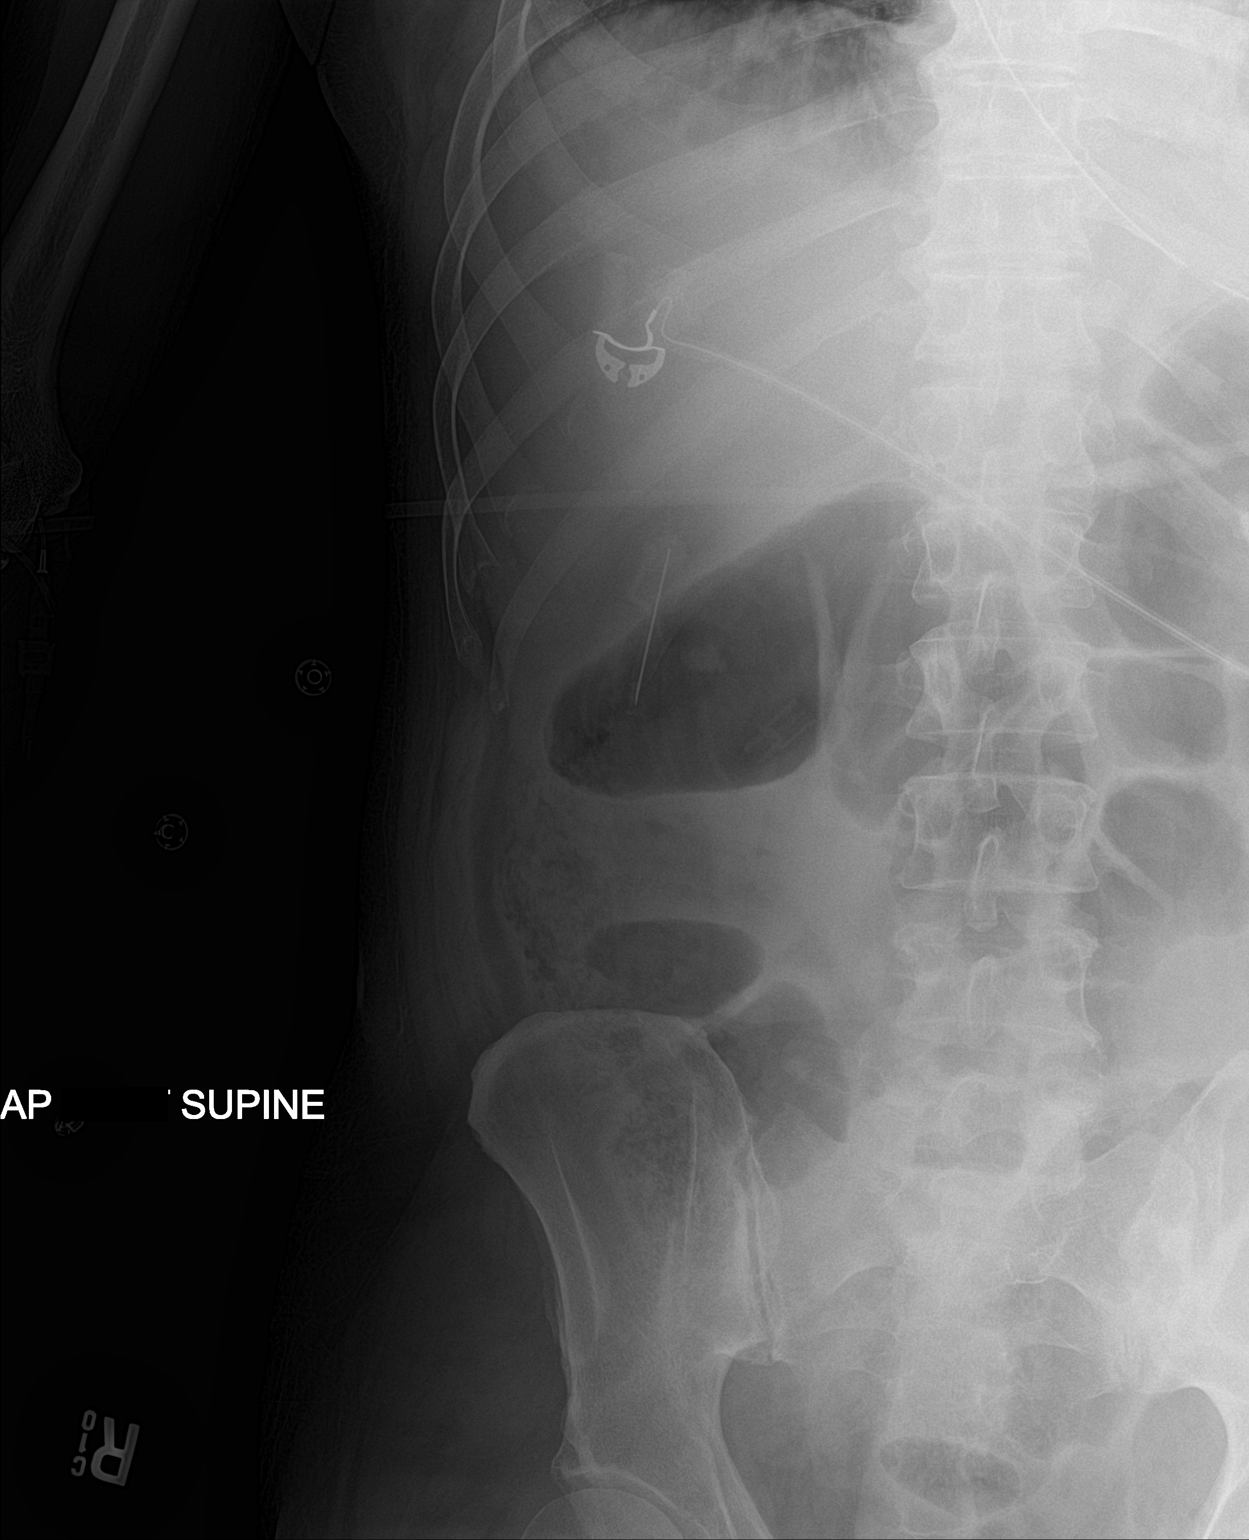

[2 of 2 positions shown; findings below may reference images not displayed]

FINDINGS: New linear foreign body at the level of the mid RIGHT abdomen,
lateral aspect, measuring approximately 4.5 cm in length, presumably
the suspected foreign body related to previous catheter.

Visualized bowel gas pattern is nonobstructive. No additional
foreign bodies identified within the abdomen or about the RIGHT hip.
IMPRESSION: New linear foreign body at the level of the mid RIGHT abdomen,
measuring 4.5 cm in length, presumably the suspected foreign body
related to the provided clinical data of catheter/guidewire
placement at an outside hospital. The exact location of the foreign
body is unclear based on these AP views. Consider decubitus view of
the abdomen to evaluate depth.

These results will be called to the ordering clinician or
representative by the Radiologist Assistant, and communication
documented in the PACS or zVision Dashboard.

## 2021-01-19 IMAGING — DX DG CHEST 1V PORT
1 series · 1 of 1 positions shown · non-contrast
Comparison: 09/17/2018

CLINICAL DATA: Respiratory insufficiency. Endotracheal tube
present.

EXAM:
PORTABLE CHEST 1 VIEW

[chest ap]
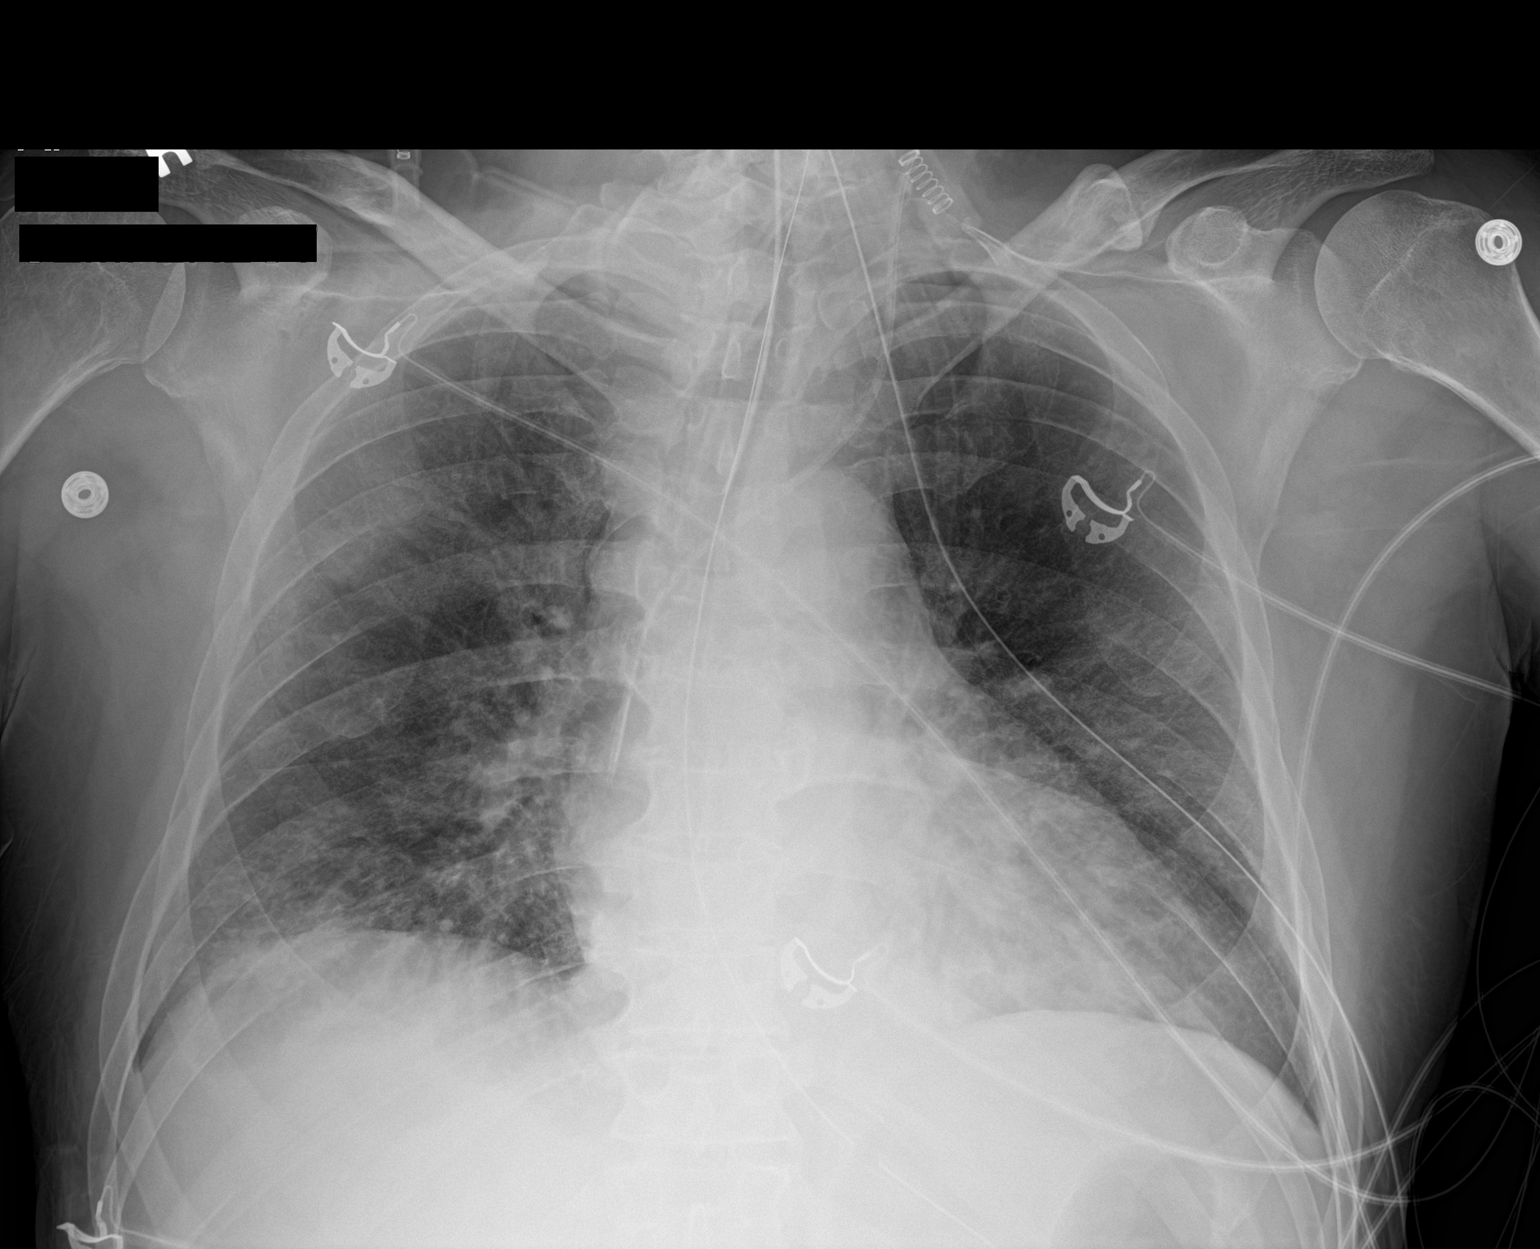

[1 of 1 positions shown; findings below may reference images not displayed]

FINDINGS: Endotracheal tube is 3.3 cm above the carina. Left jugular central
line tip at the superior cavoatrial junction. Nasogastric tube
extends into the abdomen. Patchy interstitial lung densities are
again noted and minimally changed. Slightly improved aeration at the
right costophrenic angle region. Heart size is upper limits of
normal but stable. Negative for a pneumothorax. Evidence for old
left clavicle fracture.
IMPRESSION: Patchy interstitial lung densities are stable. Findings could
represent edema and/or infection.

Support apparatuses as described.

## 2021-02-26 IMAGING — US US RENAL
1 series · 14 of 25 positions shown · non-contrast
Comparison: CT, 07/06/2018

CLINICAL DATA: Urinary tract infection.

EXAM:
RENAL / URINARY TRACT ULTRASOUND COMPLETE

[Series 1: us renal · 0.24mm/px · 14 of 80 slices shown]
[im 1/80]
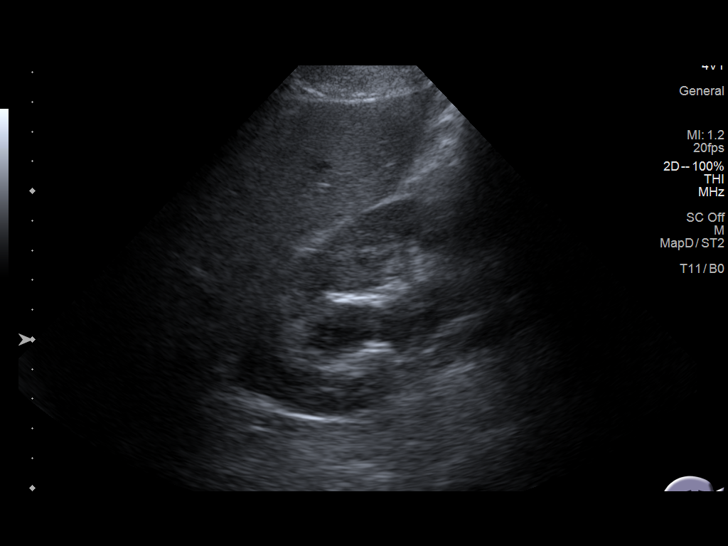
[im 7/80]
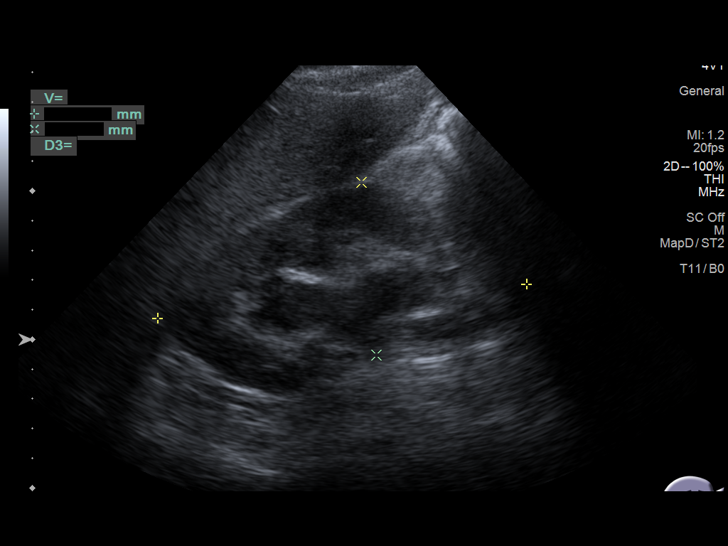
[im 14/80]
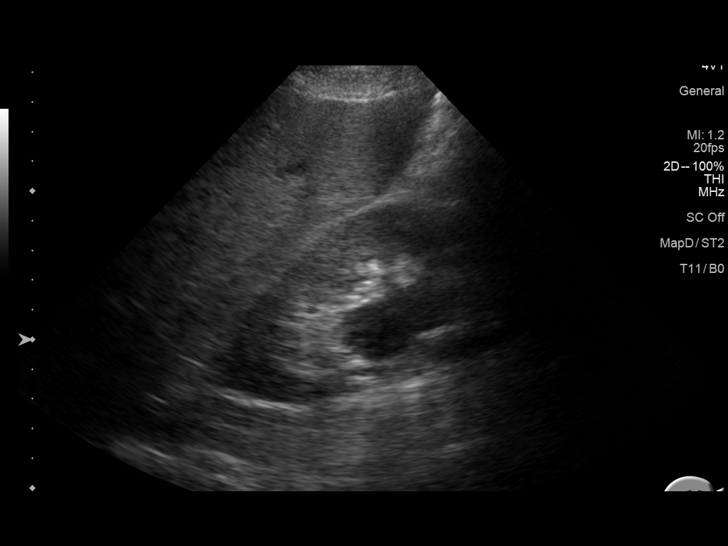
[im 20/80]
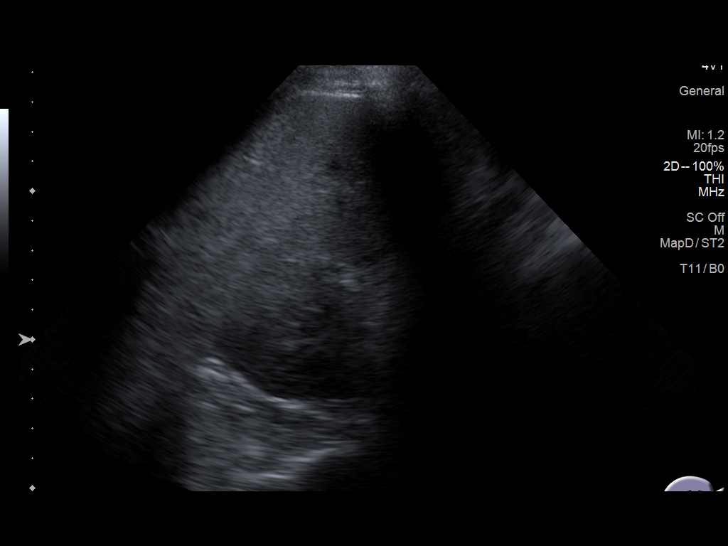
[im 27/80]
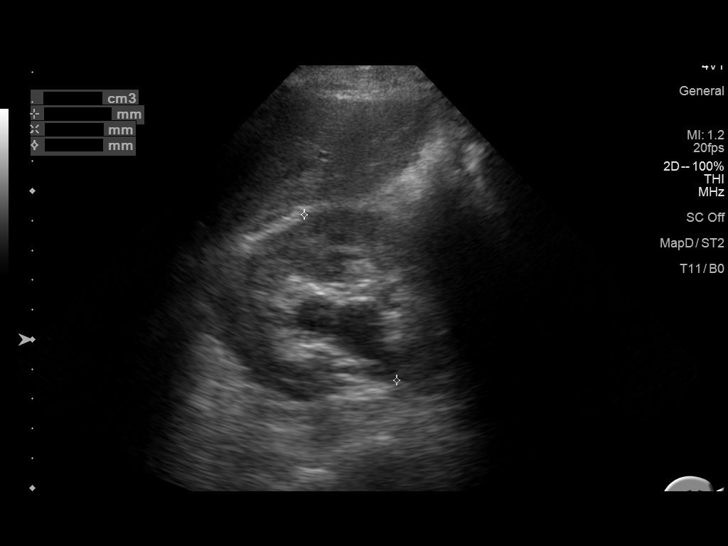
[im 30/80]
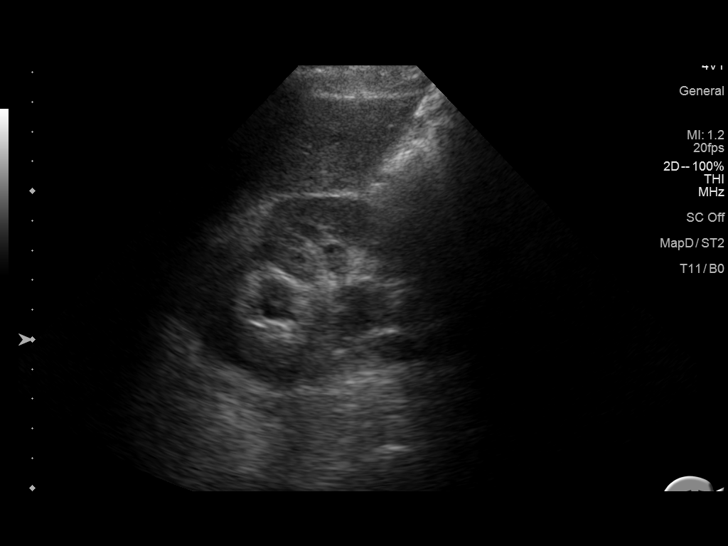
[im 37/80]
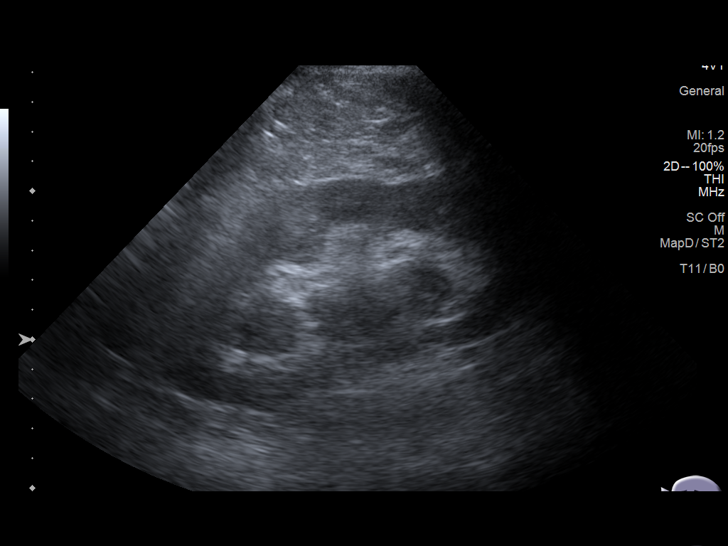
[im 43/80]
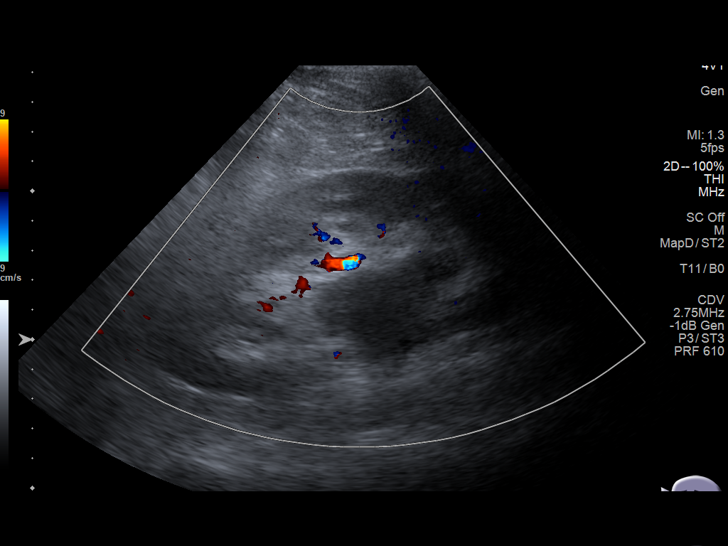
[im 50/80]
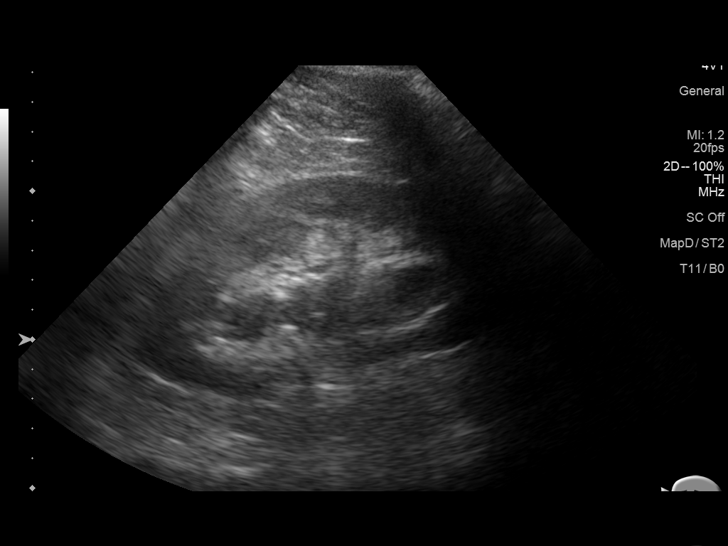
[im 53/80]
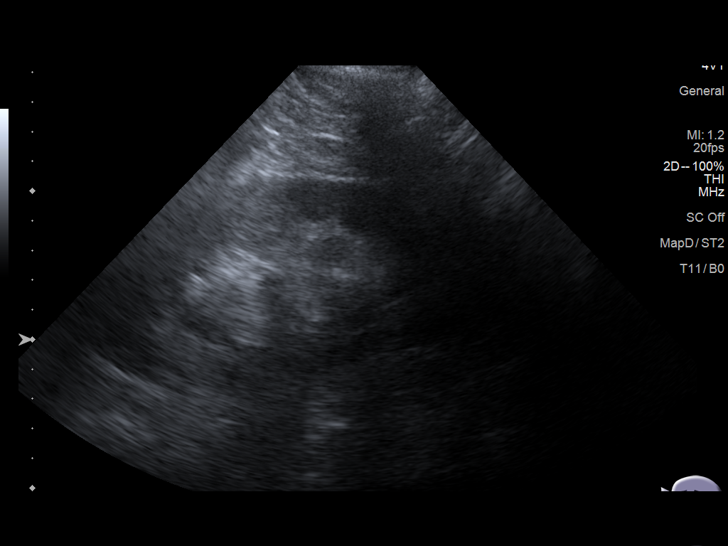
[im 60/80]
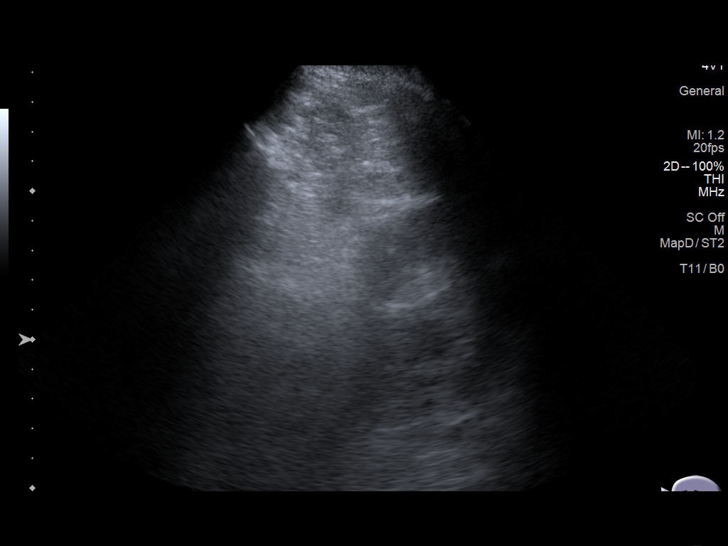
[im 66/80]
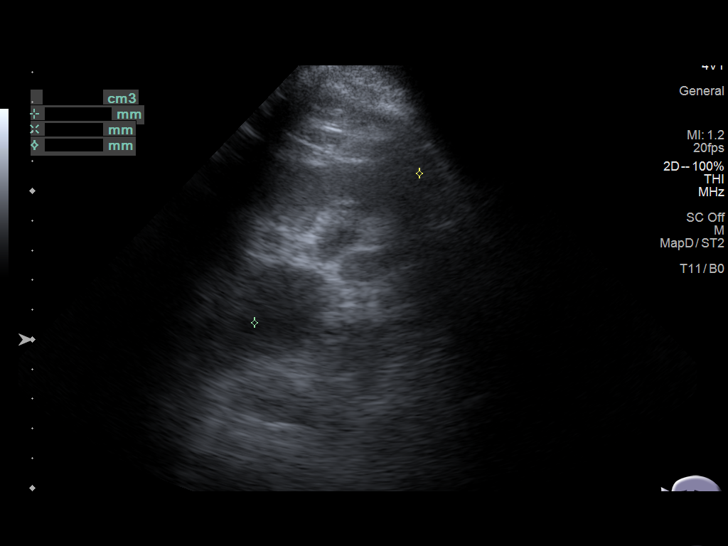
[im 73/80]
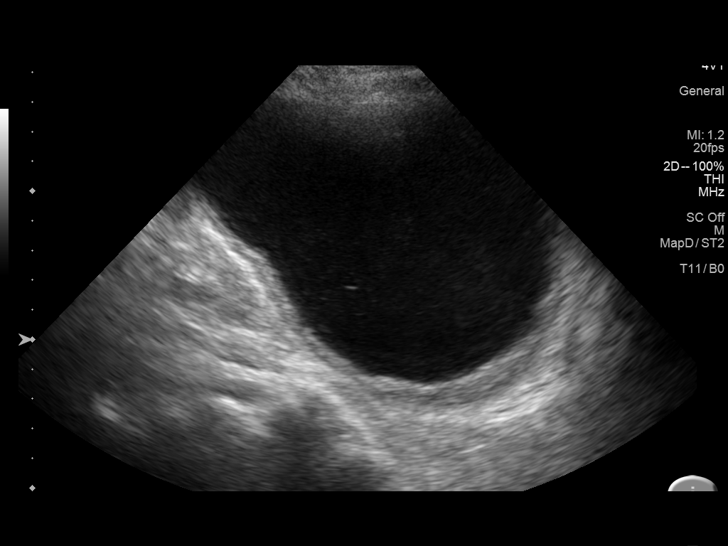
[im 80/80]
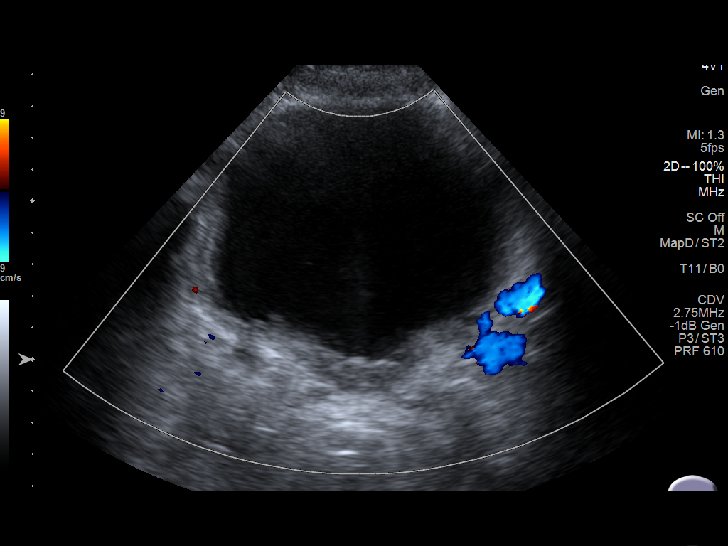

[14 of 25 positions shown; findings below may reference images not displayed]

FINDINGS: Right Kidney:

Renal measurements: 12.5 x 5.8 x 6.4 cm = volume: 244 mL. Moderate
hydronephrosis. Normal parenchymal echogenicity with no masses or
stones.

Left Kidney:

Renal measurements: 14.3 x 6.3 x 7.5 cm = volume: 351 mL. Moderate
hydronephrosis. Normal parenchymal echogenicity. No masses or
stones.

Bladder:

Bladder distended. Debris within the bladder. Bladder moderately
distended. Mild wall thickening. No masses or stones.
IMPRESSION: 1. Moderate bilateral hydronephrosis, which is similar to the prior
CT.
2. Mild bladder wall thickening and bladder debris consistent with
the reported UTI.
3. No renal masses or stones.
